# Patient Record
Sex: Female | Born: 1945 | Race: White | Hispanic: No | Smoking: Never smoker
Health system: Southern US, Community
[De-identification: ages and names within clinical notes are randomized; demographics above are authoritative.]

## PROBLEM LIST (undated history)

## (undated) DIAGNOSIS — F329 Major depressive disorder, single episode, unspecified: Secondary | ICD-10-CM

## (undated) DIAGNOSIS — T451X5A Adverse effect of antineoplastic and immunosuppressive drugs, initial encounter: Secondary | ICD-10-CM

## (undated) DIAGNOSIS — R112 Nausea with vomiting, unspecified: Secondary | ICD-10-CM

## (undated) DIAGNOSIS — R591 Generalized enlarged lymph nodes: Secondary | ICD-10-CM

## (undated) DIAGNOSIS — F32A Depression, unspecified: Secondary | ICD-10-CM

## (undated) DIAGNOSIS — I10 Essential (primary) hypertension: Secondary | ICD-10-CM

## (undated) DIAGNOSIS — G5600 Carpal tunnel syndrome, unspecified upper limb: Secondary | ICD-10-CM

## (undated) DIAGNOSIS — R630 Anorexia: Secondary | ICD-10-CM

## (undated) DIAGNOSIS — Z95828 Presence of other vascular implants and grafts: Secondary | ICD-10-CM

## (undated) DIAGNOSIS — C541 Malignant neoplasm of endometrium: Secondary | ICD-10-CM

## (undated) DIAGNOSIS — F419 Anxiety disorder, unspecified: Secondary | ICD-10-CM

## (undated) DIAGNOSIS — R634 Abnormal weight loss: Secondary | ICD-10-CM

## (undated) HISTORY — DX: Anxiety disorder, unspecified: F41.9

## (undated) HISTORY — DX: Malignant neoplasm of endometrium: C54.1

## (undated) HISTORY — DX: Nausea with vomiting, unspecified: R11.2

## (undated) HISTORY — DX: Adverse effect of antineoplastic and immunosuppressive drugs, initial encounter: T45.1X5A

## (undated) HISTORY — DX: Anorexia: R63.0

## (undated) HISTORY — PX: CARPAL TUNNEL RELEASE: SHX101

## (undated) HISTORY — DX: Generalized enlarged lymph nodes: R59.1

## (undated) HISTORY — DX: Major depressive disorder, single episode, unspecified: F32.9

## (undated) HISTORY — DX: Presence of other vascular implants and grafts: Z95.828

## (undated) HISTORY — PX: SKIN CANCER EXCISION: SHX779

## (undated) HISTORY — DX: Depression, unspecified: F32.A

## (undated) HISTORY — DX: Abnormal weight loss: R63.4

---

## 2015-05-09 HISTORY — PX: FLEXIBLE SIGMOIDOSCOPY: SHX1649

## 2015-10-25 ENCOUNTER — Encounter: Payer: Self-pay | Admitting: Emergency Medicine

## 2015-10-25 ENCOUNTER — Emergency Department: Payer: Medicare Other

## 2015-10-25 ENCOUNTER — Emergency Department
Admission: EM | Admit: 2015-10-25 | Discharge: 2015-10-25 | Disposition: A | Discharge status: Home / Self Care | Payer: Medicare Other | Attending: Emergency Medicine | Admitting: Emergency Medicine

## 2015-10-25 DIAGNOSIS — R634 Abnormal weight loss: Secondary | ICD-10-CM | POA: Diagnosis not present

## 2015-10-25 DIAGNOSIS — I10 Essential (primary) hypertension: Secondary | ICD-10-CM | POA: Diagnosis not present

## 2015-10-25 DIAGNOSIS — K644 Residual hemorrhoidal skin tags: Secondary | ICD-10-CM | POA: Diagnosis not present

## 2015-10-25 DIAGNOSIS — N3090 Cystitis, unspecified without hematuria: Secondary | ICD-10-CM | POA: Insufficient documentation

## 2015-10-25 DIAGNOSIS — R110 Nausea: Secondary | ICD-10-CM

## 2015-10-25 DIAGNOSIS — R591 Generalized enlarged lymph nodes: Secondary | ICD-10-CM

## 2015-10-25 DIAGNOSIS — D649 Anemia, unspecified: Secondary | ICD-10-CM | POA: Insufficient documentation

## 2015-10-25 DIAGNOSIS — R599 Enlarged lymph nodes, unspecified: Secondary | ICD-10-CM | POA: Insufficient documentation

## 2015-10-25 DIAGNOSIS — N390 Urinary tract infection, site not specified: Secondary | ICD-10-CM

## 2015-10-25 HISTORY — DX: Essential (primary) hypertension: I10

## 2015-10-25 HISTORY — DX: Carpal tunnel syndrome, unspecified upper limb: G56.00

## 2015-10-25 HISTORY — DX: Generalized enlarged lymph nodes: R59.1

## 2015-10-25 LAB — COMPREHENSIVE METABOLIC PANEL
ALBUMIN: 2.8 g/dL — AB (ref 3.5–5.0)
ALK PHOS: 71 U/L (ref 38–126)
ALT: 12 U/L — ABNORMAL LOW (ref 14–54)
ANION GAP: 12 (ref 5–15)
AST: 13 U/L — ABNORMAL LOW (ref 15–41)
BILIRUBIN TOTAL: 0.6 mg/dL (ref 0.3–1.2)
BUN: 14 mg/dL (ref 6–20)
CALCIUM: 9.2 mg/dL (ref 8.9–10.3)
CHLORIDE: 98 mmol/L — AB (ref 101–111)
CO2: 25 mmol/L (ref 22–32)
Creatinine, Ser: 0.78 mg/dL (ref 0.44–1.00)
GFR calc non Af Amer: >60 mL/min (ref >60)
GLUCOSE: 104 mg/dL — AB (ref 65–99)
Potassium: 3.5 mmol/L (ref 3.5–5.1)
Sodium: 135 mmol/L (ref 135–145)
Total Protein: 7.3 g/dL (ref 6.5–8.1)

## 2015-10-25 LAB — URINALYSIS COMPLETE WITH MICROSCOPIC (ARMC ONLY)
Bilirubin Urine: NEGATIVE
Glucose, UA: NEGATIVE mg/dL
Nitrite: NEGATIVE
PH: 5 (ref 5.0–8.0)
PROTEIN: 30 mg/dL — AB
Specific Gravity, Urine: 1.024 (ref 1.005–1.030)

## 2015-10-25 LAB — CBC WITH DIFFERENTIAL/PLATELET
BASOS PCT: 1 %
Basophils Absolute: 0 10*3/uL (ref 0–0.1)
Eosinophils Absolute: 0.1 10*3/uL (ref 0–0.7)
Eosinophils Relative: 2 %
HEMATOCRIT: 30.2 % — AB (ref 35.0–47.0)
HEMOGLOBIN: 9.9 g/dL — AB (ref 12.0–16.0)
LYMPHS ABS: 0.8 10*3/uL — AB (ref 1.0–3.6)
Lymphocytes Relative: 15 %
MCH: 25.4 pg — AB (ref 26.0–34.0)
MCHC: 32.9 g/dL (ref 32.0–36.0)
MCV: 77.2 fL — AB (ref 80.0–100.0)
MONO ABS: 0.6 10*3/uL (ref 0.2–0.9)
MONOS PCT: 12 %
NEUTROS ABS: 3.7 10*3/uL (ref 1.4–6.5)
NEUTROS PCT: 70 %
Platelets: 343 10*3/uL (ref 150–440)
RBC: 3.91 MIL/uL (ref 3.80–5.20)
RDW: 15.6 % — AB (ref 11.5–14.5)
WBC: 5.2 10*3/uL (ref 3.6–11.0)

## 2015-10-25 LAB — TROPONIN I

## 2015-10-25 MED ORDER — IOHEXOL 240 MG/ML SOLN
25.0000 mL | Freq: Once | INTRAMUSCULAR | Status: AC | PRN
  Administered 2015-10-25: 25 mL via ORAL

## 2015-10-25 MED ORDER — DEXTROSE 5 % IV SOLN
1.0000 g | Freq: Once | INTRAVENOUS | Status: AC
  Administered 2015-10-25: 1 g via INTRAVENOUS
  Filled 2015-10-25: qty 10

## 2015-10-25 MED ORDER — SODIUM CHLORIDE 0.9 % IV SOLN
Freq: Once | INTRAVENOUS | Status: AC
  Administered 2015-10-25: 14:00:00 via INTRAVENOUS

## 2015-10-25 MED ORDER — PROMETHAZINE HCL 25 MG/ML IJ SOLN
12.5000 mg | Freq: Once | INTRAMUSCULAR | Status: AC
  Administered 2015-10-25: 12.5 mg via INTRAVENOUS
  Filled 2015-10-25: qty 1

## 2015-10-25 MED ORDER — PROMETHAZINE HCL 25 MG PO TABS: 25.0000 mg | ORAL_TABLET | Freq: Four times a day (QID) | ORAL | Status: DC | PRN

## 2015-10-25 MED ORDER — IOHEXOL 300 MG/ML  SOLN
100.0000 mL | Freq: Once | INTRAMUSCULAR | Status: AC | PRN
  Administered 2015-10-25: 100 mL via INTRAVENOUS

## 2015-10-25 MED ORDER — SULFAMETHOXAZOLE-TRIMETHOPRIM 800-160 MG PO TABS: 1.0000 | ORAL_TABLET | Freq: Two times a day (BID) | ORAL | Status: DC

## 2015-10-25 NOTE — Discharge Instructions (Signed)
Nausea and Vomiting Nausea is a sick feeling that often comes before throwing up (vomiting). Vomiting is a reflex where stomach contents come out of your mouth. Vomiting can cause severe loss of body fluids (dehydration). Children and elderly adults can become dehydrated quickly, especially if they also have diarrhea. Nausea and vomiting are symptoms of a condition or disease. It is important to find the cause of your symptoms.  CAUSESDirect irritation of the stomach lining. This irritation can result from increased acid production (gastroesophageal reflux disease), infection, food poisoning, taking certain medicines (such as nonsteroidal anti-inflammatory drugs), alcohol use, or tobacco use.  Signals from the brain.These signals could be caused by a headache, heat exposure, an inner ear disturbance, increased pressure in the brain from injury, infection, a tumor, or a concussion, pain, emotional stimulus, or metabolic problems.  An obstruction in the gastrointestinal tract (bowel obstruction).  Illnesses such as diabetes, hepatitis, gallbladder problems, appendicitis, kidney problems, cancer, sepsis, atypical symptoms of a heart attack, or eating disorders.  Medical treatments such as chemotherapy and radiation.  Receiving medicine that makes you sleep (general anesthetic) during surgery. DIAGNOSIS Your caregiver may ask for tests to be done if the problems do not improve after a few days. Tests may also be done if symptoms are severe or if the reason for the nausea and vomiting is not clear. Tests may include:  Urine tests.  Blood tests.  Stool tests.  Cultures (to look for evidence of infection).  X-rays or other imaging studies. Test results can help your caregiver make decisions about treatment or the need for additional tests. TREATMENT You need to stay well hydrated. Drink frequently but in small amounts.You may wish to drink water, sports drinks, clear broth, or eat frozen ice  pops or gelatin dessert to help stay hydrated.When you eat, eating slowly may help prevent nausea.There are also some antinausea medicines that may help prevent nausea. HOME CARE INSTRUCTIONS   Take all medicine as directed by your caregiver.  If you do not have an appetite, do not force yourself to eat. However, you must continue to drink fluids.  If you have an appetite, eat a normal diet unless your caregiver tells you differently.  Eat a variety of complex carbohydrates (rice, wheat, potatoes, bread), lean meats, yogurt, fruits, and vegetables.  Avoid high-fat foods because they are more difficult to digest.  Drink enough water and fluids to keep your urine clear or pale yellow.  If you are dehydrated, ask your caregiver for specific rehydration instructions. Signs of dehydration may include:  Severe thirst.  Dry lips and mouth.  Dizziness.  Dark urine.  Decreasing urine frequency and amount.  Confusion.  Rapid breathing or pulse. SEEK IMMEDIATE MEDICAL CARE IF:   You have blood or brown flecks (like coffee grounds) in your vomit.  You have black or bloody stools.  You have a severe headache or stiff neck.  You are confused.  You have severe abdominal pain.  You have chest pain or trouble breathing.  You do not urinate at least once every 8 hours.  You develop cold or clammy skin.  You continue to vomit for longer than 24 to 48 hours.  You have a fever. MAKE SURE YOU:   Understand these instructions.  Will watch your condition.  Will get help right away if you are not doing well or get worse.   This information is not intended to replace advice given to you by your health care provider. Make sure you discuss  any questions you have with your health care provider.   Document Released: 08/24/2005 Document Revised: 11/16/2011 Document Reviewed: 01/21/2011 Elsevier Interactive Patient Education 2016 Elsevier Inc. Urinary Tract Infection Urinary tract  infections (UTIs) can develop anywhere along your urinary tract. Your urinary tract is your body's drainage system for removing wastes and extra water. Your urinary tract includes two kidneys, two ureters, a bladder, and a urethra. Your kidneys are a pair of bean-shaped organs. Each kidney is about the size of your fist. They are located below your ribs, one on each side of your spine. CAUSES Infections are caused by microbes, which are microscopic organisms, including fungi, viruses, and bacteria. These organisms are so small that they can only be seen through a microscope. Bacteria are the microbes that most commonly cause UTIs. SYMPTOMS  Symptoms of UTIs may vary by age and gender of the patient and by the location of the infection. Symptoms in young women typically include a frequent and intense urge to urinate and a painful, burning feeling in the bladder or urethra during urination. Older women and men are more likely to be tired, shaky, and weak and have muscle aches and abdominal pain. A fever may mean the infection is in your kidneys. Other symptoms of a kidney infection include pain in your back or sides below the ribs, nausea, and vomiting. DIAGNOSIS To diagnose a UTI, your caregiver will ask you about your symptoms. Your caregiver will also ask you to provide a urine sample. The urine sample will be tested for bacteria and [place] blood cells. [place] blood cells are made by your body to help fight infection. TREATMENT  Typically, UTIs can be treated with medication. Because most UTIs are caused by a bacterial infection, they usually can be treated with the use of antibiotics. The choice of antibiotic and length of treatment depend on your symptoms and the type of bacteria causing your infection. HOME CARE INSTRUCTIONS  If you were prescribed antibiotics, take them exactly as your caregiver instructs you. Finish the medication even if you feel better after you have only taken some of the  medication.  Drink enough water and fluids to keep your urine clear or pale yellow.  Avoid caffeine, tea, and carbonated beverages. They tend to irritate your bladder.  Empty your bladder often. Avoid holding urine for long periods of time.  Empty your bladder before and after sexual intercourse.  After a bowel movement, women should cleanse from front to back. Use each tissue only once. SEEK MEDICAL CARE IF:   You have back pain.  You develop a fever.  Your symptoms do not begin to resolve within 3 days. SEEK IMMEDIATE MEDICAL CARE IF:   You have severe back pain or lower abdominal pain.  You develop chills.  You have nausea or vomiting.  You have continued burning or discomfort with urination. MAKE SURE YOU:   Understand these instructions.  Will watch your condition.  Will get help right away if you are not doing well or get worse.   This information is not intended to replace advice given to you by your health care provider. Make sure you discuss any questions you have with your health care provider.   Document Released: 06/03/2005 Document Revised: 05/15/2015 Document Reviewed: 10/02/2011 Elsevier Interactive Patient Education 2016 Elsevier Inc.   Lymphadenopathy Lymphadenopathy refers to swollen or enlarged lymph glands, also called lymph nodes. Lymph glands are part of your body's defense (immune) system, which protects the body from infections, germs, and  diseases. Lymph glands are found in many locations in your body, including the neck, underarm, and groin.  Many things can cause lymph glands to become enlarged. When your immune system responds to germs, such as viruses or bacteria, infection-fighting cells and fluid build up. This causes the glands to grow in size. Usually, this is not something to worry about. The swelling and any soreness often go away without treatment. However, swollen lymph glands can also be caused by a number of diseases. Your health care  provider may do various tests to help determine the cause. If the cause of your swollen lymph glands cannot be found, it is important to monitor your condition to make sure the swelling goes away. HOME CARE INSTRUCTIONS Watch your condition for any changes. The following actions may help to lessen any discomfort you are feeling: Get plenty of rest. Take medicines only as directed by your health care provider. Your health care provider may recommend over-the-counter medicines for pain. Apply moist heat compresses to the site of swollen lymph nodes as directed by your health care provider. This can help reduce any pain. Check your lymph nodes daily for any changes. Keep all follow-up visits as directed by your health care provider. This is important. SEEK MEDICAL CARE IF: Your lymph nodes are still swollen after 2 weeks. Your swelling increases or spreads to other areas. Your lymph nodes are hard, seem fixed to the skin, or are growing rapidly. Your skin over the lymph nodes is red and inflamed. You have a fever. You have chills. You have fatigue. You develop a sore throat. You have abdominal pain. You have weight loss. You have night sweats. SEEK IMMEDIATE MEDICAL CARE IF: You notice fluid leaking from the area of the enlarged lymph node. You have severe pain in any area of your body. You have chest pain. You have shortness of breath.   This information is not intended to replace advice given to you by your health care provider. Make sure you discuss any questions you have with your health care provider.   Document Released: 06/02/2008 Document Revised: 09/14/2014 Document Reviewed: 03/29/2014 Elsevier Interactive Patient Education [employer].

## 2015-10-25 NOTE — ED Notes (Signed)
Patient transported to CT 

## 2015-10-25 NOTE — ED Notes (Signed)
Nausea all wk, some times vomits after eating. Denies pain.

## 2015-10-25 NOTE — ED Provider Notes (Signed)
[facility]  I accepted care from [facility] ____________________________________________     [place] were viewed by me. Imaging interpreted by radiologist.  CT abdomen and pelvis with contrast:  IMPRESSION: Retroperitoneal lymphadenopathy in the abdomen and pelvis. There is also bilateral inguinal lymphadenopathy. Findings are concerning for a neoplastic process. Uterus is very nodular with probable fibroid disease but difficult to differentiate ovarian tissue from the uterus and cannot exclude a neoplastic process involving the uterus or adnexa. The left inguinal lymph node is the most accessible target for tissue sampling.  1.6 cm nodular structure along the medial aspect of the right colon. This is nonspecific and could represent a diverticulum. Recommend attention to this area on follow up imaging versus screening colonoscopy.  Small nodular densities along the left lung base. These are nonspecific and the differential diagnosis may change depending on the etiology for the lymphadenopathy.  Gallstones.  Hepatic and renal cysts.  These results were called by telephone at the time of interpretation on 10/25/2015 at 3:44 pm to Dr. Reita Cliche, who verbally acknowledged these results.  ____________________________________________   PROCEDURES  Procedure(s) performed: None  Critical Care performed: None  ____________________________________________   INITIAL IMPRESSION / ASSESSMENT AND PLAN / ED COURSE   Pertinent labs & imaging results that were available during my care of the patient were reviewed by me and considered in my medical decision making (see chart for details).  I did call from radiologist concerning for metastatic cancer, possible ovarian/uterine source with lymphadenopathy throughout the abdomen and pelvis. I did discuss the CT results with patient and her spouse. I spoke with on-call medical oncologist, Dr.Brahamandy -  given that the patient is clinically stable, we discussed no in-hospital admission, but follow-up early next week with oncology with plan for lymph node biopsy. Patient's information was given to the oncologist will call the patient. Patient was given office number to follow-up on Monday to make sure the appointment is scheduled for early next week.  CONSULTATIONS: Phone consultation with medical oncology.    Patient / Family / Caregiver informed of clinical course, medical decision-making process, and agree with plan.   I discussed return precautions, follow-up instructions, and discharged instructions with patient and/or family.     ____________________________________________   FINAL CLINICAL IMPRESSION(S) / ED DIAGNOSES  Final diagnoses:  Nausea  Weight loss  Anemia, unspecified anemia type  UTI (lower urinary tract infection)  Lymphadenopathy        Lisa Roca, MD 10/25/15 [phone]

## 2015-10-25 NOTE — ED Provider Notes (Signed)
[facility] Emergency Department Provider Note     Time seen: ----------------------------------------- 1:17 PM on 10/25/2015 -----------------------------------------    I have reviewed the triage vital signs and the nursing notes.   HISTORY  Chief Complaint Nausea    HPI Jennifer Berry is a 70 y.o. female who presents to the ER for nausea and vomiting since October. Patient has been seen by her primary care doctor who has put her on antiemetics that haven't helped. Patient has lost a considerable amount of weight, every time she she gets nauseous and has no appetite. She denies being stressed or depressed, but she denies any other recent illness or symptoms. She denies any abdominal pain with eating. Nothing has made her symptoms better.  Past Medical History  Diagnosis Date  . Hypertension   . Carpal tunnel syndrome     There are no active problems to display for this patient.   History reviewed. No pertinent past surgical history.  Allergies Ibuprofen  Social History Social History  Substance Use Topics  . Smoking status: Never Smoker   . Smokeless tobacco: None  . Alcohol Use: None    Review of Systems Constitutional: Negative for fever. Positive for weight loss Eyes: Negative for visual changes. ENT: Negative for sore throat. Cardiovascular: Negative for chest pain. Respiratory: Negative for shortness of breath. Gastrointestinal: Negative for abdominal pain, positive for persistent vomiting Genitourinary: Negative for dysuria. Musculoskeletal: Negative for back pain. Skin: Negative for rash. Neurological: Negative for headaches, focal weakness or numbness.  10-point ROS otherwise negative.  ____________________________________________   PHYSICAL EXAM:  VITAL SIGNS: ED Triage Vitals  Enc Vitals Group     BP 10/25/15 1007 150/69 mmHg     Pulse Rate 10/25/15 1007 72     Resp 10/25/15 1007 18     Temp 10/25/15 1007 98.6 F  (37 C)     Temp Source 10/25/15 1007 Oral     SpO2 10/25/15 1007 100 %     Weight 10/25/15 1007 150 lb (68.04 kg)     Height 10/25/15 1007 5\' 6"  (1.676 m)     Head Cir --      Peak Flow --      Pain Score --      Pain Loc --      Pain Edu? --      Excl. in [place]? --     Constitutional: Alert and oriented. Well appearing and in no distress. Eyes: Conjunctivae are normal. PERRL. Normal extraocular movements. ENT   Head: Normocephalic and atraumatic.   Nose: No congestion/rhinnorhea.   Mouth/Throat: Mucous membranes are moist.   Neck: No stridor. Cardiovascular: Normal rate, regular rhythm. Normal and symmetric distal pulses are present in all extremities. No murmurs, rubs, or gallops. Respiratory: Normal respiratory effort without tachypnea nor retractions. Breath sounds are clear and equal bilaterally. No wheezes/rales/rhonchi. Gastrointestinal: Soft and nontender. No distention. No abdominal bruits.  Rectal: Small external hemorrhoid, nonbleeding, heme-negative stool Musculoskeletal: Nontender with normal range of motion in all extremities. No joint effusions.  No lower extremity tenderness nor edema. Neurologic:  Normal speech and language. No gross focal neurologic deficits are appreciated.  Skin:  Skin is warm, dry and intact. No rash noted. Psychiatric: Mood and affect are normal. Speech and behavior are normal. Patient exhibits appropriate insight and judgment. ____________________________________________  ED COURSE:  Pertinent labs & imaging results that were available during my care of the patient were reviewed by me and considered in my medical decision making (  see chart for details). Patient with persistent nausea and vomiting, weight loss. I will evaluate her for possible cancer. ____________________________________________    LABS (pertinent positives/negatives)  Labs Reviewed  COMPREHENSIVE METABOLIC PANEL - Abnormal; Notable for the following:     Chloride 98 (*)    Glucose, Bld 104 (*)    Albumin 2.8 (*)    AST 13 (*)    ALT 12 (*)    All other components within normal limits  CBC WITH DIFFERENTIAL/PLATELET - Abnormal; Notable for the following:    Hemoglobin 9.9 (*)    HCT 30.2 (*)    MCV 77.2 (*)    MCH 25.4 (*)    RDW 15.6 (*)    Lymphs Abs 0.8 (*)    All other components within normal limits  URINALYSIS COMPLETEWITH MICROSCOPIC ([facility] ONLY) - Abnormal; Notable for the following:    Color, Urine AMBER (*)    APPearance HAZY (*)    Ketones, ur 1+ (*)    Hgb urine dipstick 1+ (*)    Protein, ur 30 (*)    Leukocytes, UA 2+ (*)    Bacteria, UA RARE (*)    Squamous Epithelial / LPF 6-30 (*)    All other components within normal limits  TROPONIN I    RADIOLOGY Images were viewed by me Chest x-ray is normal CT of the abdomen and pelvis with contrast is pending ____________________________________________  FINAL ASSESSMENT AND PLAN  Vomiting, anemia, cystitis  Plan: Patient with labs and imaging as dictated above. Patient was given a liter of fluids as well as IV Rocephin for urinary tract infection.  Final disposition has been checked out to Dr. Reita Cliche. Earleen Newport, MD   Earleen Newport, MD 10/25/15 [phone]

## 2015-10-25 NOTE — ED Notes (Signed)
Pt c/o nausea and vomiting every time she tries to eat. Pt sts "I just can't keep nothing down".  Pt sts she saw PCP 3 wks ago for same problem and was given zofran, but unable to keep pills down.  Pt also sts she has not been able to take regular home meds.

## 2015-10-26 ENCOUNTER — Other Ambulatory Visit: Payer: Self-pay | Admitting: Internal Medicine

## 2015-10-28 ENCOUNTER — Telehealth: Payer: Self-pay | Admitting: *Deleted

## 2015-10-28 ENCOUNTER — Inpatient Hospital Stay: Payer: Medicare Other

## 2015-10-28 ENCOUNTER — Ambulatory Visit: Payer: Medicare Other | Admitting: Internal Medicine

## 2015-10-28 ENCOUNTER — Encounter: Payer: Self-pay | Admitting: Internal Medicine

## 2015-10-28 ENCOUNTER — Inpatient Hospital Stay: Payer: Medicare Other | Attending: Internal Medicine | Admitting: Internal Medicine

## 2015-10-28 VITALS — BP 164/85 | HR 83 | Temp 98.0°F | Resp 18 | Ht 66.0 in | Wt 158.3 lb

## 2015-10-28 DIAGNOSIS — R590 Localized enlarged lymph nodes: Secondary | ICD-10-CM | POA: Diagnosis not present

## 2015-10-28 DIAGNOSIS — I2510 Atherosclerotic heart disease of native coronary artery without angina pectoris: Secondary | ICD-10-CM | POA: Diagnosis not present

## 2015-10-28 DIAGNOSIS — Z7982 Long term (current) use of aspirin: Secondary | ICD-10-CM | POA: Insufficient documentation

## 2015-10-28 DIAGNOSIS — D509 Iron deficiency anemia, unspecified: Secondary | ICD-10-CM | POA: Insufficient documentation

## 2015-10-28 DIAGNOSIS — R1909 Other intra-abdominal and pelvic swelling, mass and lump: Secondary | ICD-10-CM

## 2015-10-28 DIAGNOSIS — R630 Anorexia: Secondary | ICD-10-CM | POA: Insufficient documentation

## 2015-10-28 DIAGNOSIS — G5600 Carpal tunnel syndrome, unspecified upper limb: Secondary | ICD-10-CM

## 2015-10-28 DIAGNOSIS — Z79899 Other long term (current) drug therapy: Secondary | ICD-10-CM | POA: Diagnosis not present

## 2015-10-28 DIAGNOSIS — R634 Abnormal weight loss: Secondary | ICD-10-CM | POA: Diagnosis not present

## 2015-10-28 DIAGNOSIS — R61 Generalized hyperhidrosis: Secondary | ICD-10-CM | POA: Diagnosis not present

## 2015-10-28 DIAGNOSIS — K8020 Calculus of gallbladder without cholecystitis without obstruction: Secondary | ICD-10-CM | POA: Diagnosis not present

## 2015-10-28 DIAGNOSIS — R112 Nausea with vomiting, unspecified: Secondary | ICD-10-CM

## 2015-10-28 DIAGNOSIS — I10 Essential (primary) hypertension: Secondary | ICD-10-CM | POA: Diagnosis not present

## 2015-10-28 DIAGNOSIS — C778 Secondary and unspecified malignant neoplasm of lymph nodes of multiple regions: Secondary | ICD-10-CM | POA: Insufficient documentation

## 2015-10-28 DIAGNOSIS — D6489 Other specified anemias: Secondary | ICD-10-CM

## 2015-10-28 DIAGNOSIS — C801 Malignant (primary) neoplasm, unspecified: Secondary | ICD-10-CM | POA: Diagnosis present

## 2015-10-28 DIAGNOSIS — R1900 Intra-abdominal and pelvic swelling, mass and lump, unspecified site: Secondary | ICD-10-CM

## 2015-10-28 DIAGNOSIS — C189 Malignant neoplasm of colon, unspecified: Secondary | ICD-10-CM

## 2015-10-28 DIAGNOSIS — F418 Other specified anxiety disorders: Secondary | ICD-10-CM | POA: Diagnosis not present

## 2015-10-28 DIAGNOSIS — K769 Liver disease, unspecified: Secondary | ICD-10-CM

## 2015-10-28 DIAGNOSIS — N281 Cyst of kidney, acquired: Secondary | ICD-10-CM | POA: Diagnosis not present

## 2015-10-28 LAB — COMPREHENSIVE METABOLIC PANEL
ALBUMIN: 3 g/dL — AB (ref 3.5–5.0)
ALT: 9 U/L — ABNORMAL LOW (ref 14–54)
ANION GAP: 5 (ref 5–15)
AST: 12 U/L — ABNORMAL LOW (ref 15–41)
Alkaline Phosphatase: 63 U/L (ref 38–126)
BILIRUBIN TOTAL: 0.5 mg/dL (ref 0.3–1.2)
BUN: 8 mg/dL (ref 6–20)
CO2: 23 mmol/L (ref 22–32)
Calcium: 8.9 mg/dL (ref 8.9–10.3)
Chloride: 103 mmol/L (ref 101–111)
Creatinine, Ser: 0.88 mg/dL (ref 0.44–1.00)
GFR calc Af Amer: >60 mL/min (ref >60)
GFR calc non Af Amer: >60 mL/min (ref >60)
GLUCOSE: 118 mg/dL — AB (ref 65–99)
POTASSIUM: 3.4 mmol/L — AB (ref 3.5–5.1)
SODIUM: 131 mmol/L — AB (ref 135–145)
TOTAL PROTEIN: 7.7 g/dL (ref 6.5–8.1)

## 2015-10-28 LAB — IRON AND TIBC
Iron: 11 ug/dL — ABNORMAL LOW (ref 28–170)
SATURATION RATIOS: 6 % — AB (ref 10.4–31.8)
TIBC: 179 ug/dL — ABNORMAL LOW (ref 250–450)
UIBC: 168 ug/dL

## 2015-10-28 LAB — CBC WITH DIFFERENTIAL/PLATELET
BASOS PCT: 0 %
Basophils Absolute: 0 10*3/uL (ref 0–0.1)
EOS ABS: 0.1 10*3/uL (ref 0–0.7)
EOS PCT: 2 %
HEMATOCRIT: 29.9 % — AB (ref 35.0–47.0)
Hemoglobin: 9.9 g/dL — ABNORMAL LOW (ref 12.0–16.0)
Lymphocytes Relative: 15 %
Lymphs Abs: 0.7 10*3/uL — ABNORMAL LOW (ref 1.0–3.6)
MCH: 25.9 pg — ABNORMAL LOW (ref 26.0–34.0)
MCHC: 33.1 g/dL (ref 32.0–36.0)
MCV: 78.2 fL — ABNORMAL LOW (ref 80.0–100.0)
MONO ABS: 0.5 10*3/uL (ref 0.2–0.9)
MONOS PCT: 11 %
Neutro Abs: 3.4 10*3/uL (ref 1.4–6.5)
Neutrophils Relative %: 72 %
Platelets: 385 10*3/uL (ref 150–440)
RBC: 3.82 MIL/uL (ref 3.80–5.20)
RDW: 15.5 % — AB (ref 11.5–14.5)
WBC: 4.7 10*3/uL (ref 3.6–11.0)

## 2015-10-28 LAB — PROTIME-INR
INR: 1.32
Prothrombin Time: 16.5 seconds — ABNORMAL HIGH (ref 11.4–15.0)

## 2015-10-28 LAB — APTT: APTT: 37 s — AB (ref 24–36)

## 2015-10-28 LAB — FERRITIN: FERRITIN: 248 ng/mL (ref 11–307)

## 2015-10-28 LAB — LACTATE DEHYDROGENASE: LDH: 117 U/L (ref 98–192)

## 2015-10-28 LAB — RETICULOCYTES
RBC.: 3.82 MIL/uL (ref 3.80–5.20)
RETIC COUNT ABSOLUTE: 49.7 10*3/uL (ref 19.0–183.0)
Retic Ct Pct: 1.3 % (ref 0.4–3.1)

## 2015-10-28 NOTE — Progress Notes (Signed)
Last mammogram September 2016 per patient.  She also had a flex sigmoid at that time by her primary care. She reports persistent episodes of nausea and vomiting that started since October 2016. She has had a 34 lb weight loss since October. She reports frequent episodes of "drenching night sweats."  She is accompanied by her daughter, Lattie Haw at today's visit. The patient's daughter lives in [place], [place].

## 2015-10-28 NOTE — Telephone Encounter (Signed)
pt called x 3 and hung up on clinical hotline. Msg left x 3. VM left was not audible. Based on ph# attached to call, RN Able to verify that the patient was trying to call back. I attempted to contact pt back. Unable to leave VM as phone msg system.   Spoke with patient's daughter. She was aware of the appointment and the need for being NPO after midnight tonight. Teach back process performed with patient's daughter. Daughter unable to come for the apt tomorrow due to work; however, the pt's husband will bring pt to appointment.

## 2015-10-28 NOTE — Telephone Encounter (Signed)
Speciality scheduling contacted cancer center. Pt's left inguinal lymph node ultrasound guided bx can be performed tomorrow. Pt needs to arrive at 830 am and she will need to remain NPO after midnight tonight. I have attempted x 3 to call the patient over the last hour. I also attempted to contact her emergency contact-daughter Lattie Haw. No answer.

## 2015-10-28 NOTE — Progress Notes (Signed)
  Oncology Nurse Navigator Documentation  Navigator Location: [facility]-Med Onc (10/28/15 1100) Navigator Encounter Type: Initial MedOnc (10/28/15 1100)   Abnormal Finding Date: 10/25/15 (10/28/15 1100)       Patient Visit Type: Initial;MedOnc (10/28/15 1100) Treatment Phase: Abnormal Scans (10/28/15 1100) Barriers/Navigation Needs: Education (10/28/15 1100) Education:  (Chest Ct/biopsy) (10/28/15 1100) Interventions: Education Method (10/28/15 1100)     Education Method: Verbal (10/28/15 1100)      Acuity: Level 2 (10/28/15 1100)         Time Spent with Patient: 26 (10/28/15 1100)   Attended initial Med Onc consult with Dr Rogue Bussing. Introduced Therapist, nutritional and provided contact information for any further questions or concerns. CT chest scheduled 2/21, labs today, left inguinal biopsy being arranged.

## 2015-10-28 NOTE — Progress Notes (Signed)
[facility] CONSULT NOTE  Patient Care Team: Petra Kuba, MD as PCP - General (Family Medicine)  CHIEF COMPLAINTS/PURPOSE OF CONSULTATION: Lymphadenopathy concerning for malignancy  HISTORY OF PRESENTING ILLNESS:  Jennifer Berry 70 y.o.  female no significant past medical history except for hypertension was recently evaluated in the emergency room given the nausea and overall feeling poorly. CT of the abdomen and pelvis showed- multiple retroperitoneal lymph nodes; and pelvic adenopathy including inguinal adenopathy. Also nodular contour of the uterus was noted/with possible adnexal mass. She's been referred to [place] for further evaluation/and recommendations.  Patient's notes to have intermittent nausea/feeling poorly at least since fall of last year. She also complains of a 30 pound weight loss; and also complains of night sweats 3-4 episodes. Poor appetite. Denies any blood in stools or denies any abnormal vaginal bleeding. Patient had a flexible sigmoidoscopy in September 2016.  No headaches; no unusual shortness of breath or chest pain. Intermittent cough. No vision changes-or any falls. Denies any tingling and numbness. She denies any pain anywhere.  ROS: A complete 10 point review of system is done which is negative except mentioned above in history of present illness  MEDICAL HISTORY:  Past Medical History  Diagnosis Date  . Hypertension   . Carpal tunnel syndrome   . Lymphadenopathy 10/25/15  . Decreased appetite   . Weight loss     30 lb weight loss since October 2016  . Anxiety   . Depression     SURGICAL HISTORY: Past Surgical History  Procedure Laterality Date  . Carpal tunnel release Left   . Skin cancer excision    . Flexible sigmoidoscopy  05/2015    performed by Dr. Petra Kuba,    SOCIAL HISTORY: No smoking and alcohol. She lives with family at home.  Social History   Social History  . Marital Status: Divorced    Spouse Name: N/A  .  Number of Children: N/A  . Years of Education: N/A   Occupational History  . Not on file.   Social History Main Topics  . Smoking status: Never Smoker   . Smokeless tobacco: Never Used  . Alcohol Use: No  . Drug Use: No  . Sexual Activity: No   Other Topics Concern  . Not on file   Social History Narrative    FAMILY HISTORY: No family history of cancer.   ALLERGIES:  is allergic to ibuprofen and tape.  MEDICATIONS:  Current Outpatient Prescriptions  Medication Sig Dispense Refill  . amLODipine (NORVASC) 10 MG tablet Take 1 tablet by mouth daily.    Marland Kitchen aspirin EC 81 MG tablet Take 81 mg by mouth daily.    . hydrochlorothiazide (HYDRODIURIL) 25 MG tablet Take 1 tablet by mouth daily.    Marland Kitchen losartan (COZAAR) 100 MG tablet Take 1 tablet by mouth daily.    . promethazine (PHENERGAN) 25 MG tablet Take 1 tablet (25 mg total) by mouth every 6 (six) hours as needed for nausea or vomiting. 20 tablet 0  . sulfamethoxazole-trimethoprim (BACTRIM DS) 800-160 MG tablet Take 1 tablet by mouth 2 (two) times daily. 20 tablet 0  . ondansetron (ZOFRAN) 4 MG tablet Take by mouth. Reported on 10/28/2015     No current facility-administered medications for this visit.      Marland Kitchen  PHYSICAL EXAMINATION: ECOG PERFORMANCE STATUS: 1 - Symptomatic but completely ambulatory  Filed Vitals:   10/28/15 0821  BP: 164/85  Pulse: 83  Temp: 98 F (36.7  C)  Resp: 18   Filed Weights   10/28/15 0809  Weight: 158 lb 4.6 oz (71.8 kg)    GENERAL: Well-nourished well-developed; Alert, no distress and  Mildly comfortable given her nausea. She is accompanied by her daughter. EYES: no pallor or icterus OROPHARYNX: no thrush or ulceration; good dentition  NECK: supple, no masses felt LYMPH:  no palpable lymphadenopathy in the cervical ; 1 cm left axillary lymph node felt; about 1 cm left inguinal lymph node felt. LUNGS: clear to auscultation and  No wheeze or crackles HEART/CVS: regular rate & rhythm and no  murmurs; No lower extremity edema ABDOMEN: abdomen soft, non-tender and normal bowel sounds Musculoskeletal:no cyanosis of digits and no clubbing  PSYCH: alert & oriented x 3 with fluent speech NEURO: no focal motor/sensory deficits SKIN:  no rashes or significant lesions  LABORATORY DATA:  I have reviewed the data as listed Lab Results  Component Value Date   WBC 5.2 10/25/2015   HGB 9.9* 10/25/2015   HCT 30.2* 10/25/2015   MCV 77.2* 10/25/2015   PLT 343 10/25/2015    Recent Labs  10/25/15 1027  NA 135  K 3.5  CL 98*  CO2 25  GLUCOSE 104*  BUN 14  CREATININE 0.78  CALCIUM 9.2  GFRNONAA >60  GFRAA >60  PROT 7.3  ALBUMIN 2.8*  AST 13*  ALT 12*  ALKPHOS 71  BILITOT 0.6    RADIOGRAPHIC STUDIES: I have personally reviewed the radiological images as listed and agreed with the findings in the report. Dg Chest 1 View  10/25/2015  CLINICAL DATA:  Patient states that she has had worsening nausea and vomiting since October which has worsened this past week and especially today. Patient states that she visited her PCP for symptoms and was prescribed nausea medication which has not helped. Patient states that almost everything she eats or drinks comes back up within a very short time. Patient has a HX of HTN. EXAM: CHEST 1 VIEW COMPARISON:  None. FINDINGS: The heart size and mediastinal contours are within normal limits. Both lungs are clear. No pleural effusion or pneumothorax. Bony thorax intact. IMPRESSION: No active disease. Electronically Signed   By: Lajean Manes M.D.   On: 10/25/2015 14:45   Ct Abdomen Pelvis W Contrast  10/25/2015  CLINICAL DATA:  Weight loss and persistent vomiting. EXAM: CT ABDOMEN AND PELVIS WITH CONTRAST TECHNIQUE: Multidetector CT imaging of the abdomen and pelvis was performed using the standard protocol following bolus administration of intravenous contrast. CONTRAST:  182mL OMNIPAQUE IOHEXOL 300 MG/ML  SOLN COMPARISON:  None. FINDINGS: Lower chest: 6  mm pleural-based nodule at the left lung base on sequence 4, images 6. There is adjacent pleural thickening on image 5. Otherwise, the lung bases are clear without pleural effusions. Hepatobiliary: 1.6 cm low-density exophytic structure involving the inferior left hepatic lobe. This is low-density could represent a cyst. There is also punctate low-density structure along the right hepatic lobe on sequence 2, image 7 which is too small to characterize. No other liver lesions. There are small calcified gallstones without gallbladder enlargement or inflammation. No significant biliary dilatation. Pancreas: Normal appearance of the pancreas without duct dilatation or inflammation. Spleen: No acute abnormality to the spleen. Adrenals/Urinary Tract: Normal appearance of the adrenal glands. Large central right renal cyst measuring up to 5.8 cm without hydronephrosis. Probable small cyst in the left kidney upper pole. No suspicious renal lesions. Normal appearance of the urinary bladder. Stomach/Bowel: There is a 1.6 cm round  dense structure along the medial aspect of the right colon on sequence 2, image 36. Difficult to exclude a nodular structure or diverticulum involving the colon at this level. No gross abnormality to the stomach, duodenum or small bowel. No evidence for a bowel obstruction. Vascular/Lymphatic: Enlarged bilateral inguinal lymph nodes. Largest node is on the left side measuring 2.4 x 1.7 cm on sequence 2, image 72. There are enlarged bilateral pelvic lymph nodes in the external iliac nodal chains. Lesion on the right side measures 2.1 x 1.5 cm on sequence 2, image 60. Enlarged left retroperitoneal lymph node along the left side of the aortic bifurcation measures 2.0 x 1.4 cm. There are enlarged lymph nodes in the aortocaval stations. Enlarged left periaortic lymph node just below the left renal vein measures 2.0 cm in the short axis. Atherosclerotic calcifications in the aorta and iliac arteries without  aneurysm. No evidence for thrombus in the IVC, renal veins and iliac veins. Reproductive: Uterus is very nodular within a unusual configuration. There are heterogeneous, low-density and calcified structures within uterus which probably represent fibroids. The adnexal tissue appears to be adjacent to the uterus but difficult to differentiate from the uterus. Cannot exclude enlargement of the left ovary or left adnexa measuring up to 5.0 cm on sequence 2, image 59. Other: No significant free fluid. There is perirectal edema. No free air. Musculoskeletal: Multilevel degenerative disc disease with vacuum discs. IMPRESSION: Retroperitoneal lymphadenopathy in the abdomen and pelvis. There is also bilateral inguinal lymphadenopathy. Findings are concerning for a neoplastic process. Uterus is very nodular with probable fibroid disease but difficult to differentiate ovarian tissue from the uterus and cannot exclude a neoplastic process involving the uterus or adnexa. The left inguinal lymph node is the most accessible target for tissue sampling. 1.6 cm nodular structure along the medial aspect of the right colon. This is nonspecific and could represent a diverticulum. Recommend attention to this area on follow up imaging versus screening colonoscopy. Small nodular densities along the left lung base. These are nonspecific and the differential diagnosis may change depending on the etiology for the lymphadenopathy. Gallstones. Hepatic and renal cysts. These results were called by telephone at the time of interpretation on 10/25/2015 at 3:44 pm to Dr. Reita Cliche, who verbally acknowledged these results. Electronically Signed   By: Markus Daft M.D.   On: 10/25/2015 15:49    ASSESSMENT & PLAN:   # Lymphadenopathy- bulky retroperitoneal/pelvic/inguinal adenopathy- unclear etiology; but highly concerning for malignancy [given the weight loss; night sweats; lymphadenopathy based on imaging]. The differential diagnosis at this time is  broad- uterine/ovarian; colonic versus lymphoma. Left inguinal lymph node as most accessible for biopsy. We'll proceed with an ultrasound-guided biopsy of the left inguinal lymph node. Check CBC CMP LDH; check CEA 125 CEA. Check LDH.   # Microcytic anemia- question etiology. Check stool occult cards; also iron studies.  # Persistent nausea- unclear etiology. Recommend symptomatic management with Zofran/Phenergan at this time.  Patient will follow-up with me in approximately 1 week to review the above workup.  # 45 minutes face-to-face with the patient discussing the above plan of care; more than 50% of time spent on counseling and coordination.     Cammie Sickle, MD 10/28/2015 9:22 AM

## 2015-10-29 ENCOUNTER — Ambulatory Visit
Admission: RE | Admit: 2015-10-29 | Discharge: 2015-10-29 | Disposition: A | Discharge status: Home / Self Care | Payer: Medicare Other | Source: Ambulatory Visit | Attending: Internal Medicine | Admitting: Internal Medicine

## 2015-10-29 DIAGNOSIS — C189 Malignant neoplasm of colon, unspecified: Secondary | ICD-10-CM | POA: Insufficient documentation

## 2015-10-29 DIAGNOSIS — J439 Emphysema, unspecified: Secondary | ICD-10-CM | POA: Insufficient documentation

## 2015-10-29 DIAGNOSIS — Z01811 Encounter for preprocedural respiratory examination: Secondary | ICD-10-CM | POA: Diagnosis present

## 2015-10-29 DIAGNOSIS — D6489 Other specified anemias: Secondary | ICD-10-CM | POA: Insufficient documentation

## 2015-10-29 DIAGNOSIS — R918 Other nonspecific abnormal finding of lung field: Secondary | ICD-10-CM | POA: Diagnosis not present

## 2015-10-29 DIAGNOSIS — R590 Localized enlarged lymph nodes: Secondary | ICD-10-CM | POA: Diagnosis present

## 2015-10-29 DIAGNOSIS — R1900 Intra-abdominal and pelvic swelling, mass and lump, unspecified site: Secondary | ICD-10-CM

## 2015-10-29 DIAGNOSIS — C8593 Non-Hodgkin lymphoma, unspecified, intra-abdominal lymph nodes: Secondary | ICD-10-CM | POA: Diagnosis not present

## 2015-10-29 LAB — SOLUBLE TRANSFERRIN RECEPTOR: Transferrin Receptor: 28.6 nmol/L — ABNORMAL HIGH (ref 12.2–27.3)

## 2015-10-29 LAB — CEA: CEA: 1.2 ng/mL (ref 0.0–4.7)

## 2015-10-29 LAB — CA 125: CA 125: 37.9 U/mL (ref 0.0–38.1)

## 2015-10-29 MED ORDER — IOHEXOL 300 MG/ML  SOLN
75.0000 mL | Freq: Once | INTRAMUSCULAR | Status: AC | PRN
  Administered 2015-10-29: 75 mL via INTRAVENOUS

## 2015-10-29 MED ORDER — MIDAZOLAM HCL 2 MG/2ML IJ SOLN
INTRAMUSCULAR | Status: AC | PRN
  Administered 2015-10-29: 0.5 mg via INTRAVENOUS
  Administered 2015-10-29: 1 mg via INTRAVENOUS

## 2015-10-29 MED ORDER — SODIUM CHLORIDE 0.9 % IV SOLN
INTRAVENOUS | Status: DC
  Administered 2015-10-29: 09:00:00 via INTRAVENOUS

## 2015-10-29 MED ORDER — FENTANYL CITRATE (PF) 100 MCG/2ML IJ SOLN
INTRAMUSCULAR | Status: AC | PRN
  Administered 2015-10-29: 25 ug via INTRAVENOUS
  Administered 2015-10-29: 50 ug via INTRAVENOUS

## 2015-10-29 NOTE — Consult Note (Signed)
Chief Complaint: Unintentional weight loss with associated retroperitoneal and inguinal lymphadenopathy of uncertain etiology.  Referring Physician(s): Brahmanday,Govinda R  History of Present Illness: Jennifer Berry is a 70 y.o. female without any significant past medical history who underwent a CT of the abdomen and pelvis on 10/25/2015 for the workup of nausea and weight loss where it was discovered the patient has retroperitoneal and inguinal lymphadenopathy of uncertain etiology.  The patient presents today for ultrasound-guided inguinal lymph node biopsy for tissue diagnostic purposes. The patient is accompanied by her husband others as her own historian.  The patient reports nausea and overall feeling poorly since at least fall of last year. She reports an approximately 30 pound weight loss since that time. She denies blood in her stool. She denies vaginal bleeding.  Past Medical History  Diagnosis Date  . Hypertension   . Carpal tunnel syndrome   . Lymphadenopathy 10/25/15  . Decreased appetite   . Weight loss     30 lb weight loss since October 2016  . Anxiety   . Depression     Past Surgical History  Procedure Laterality Date  . Carpal tunnel release Left   . Skin cancer excision    . Flexible sigmoidoscopy  05/2015    performed by Dr. Petra Kuba,    Allergies: Ibuprofen and Tape  Medications: Prior to Admission medications   Medication Sig Start Date End Date Taking? Authorizing Provider  amLODipine (NORVASC) 10 MG tablet Take 1 tablet by mouth daily. 07/23/15  Yes Historical Provider, MD  aspirin EC 81 MG tablet Take 81 mg by mouth daily.   Yes Historical Provider, MD  hydrochlorothiazide (HYDRODIURIL) 25 MG tablet Take 1 tablet by mouth daily. 07/23/15  Yes Historical Provider, MD  losartan (COZAAR) 100 MG tablet Take 1 tablet by mouth daily. 07/23/15  Yes Historical Provider, MD  ondansetron (ZOFRAN) 4 MG tablet Take by mouth. Reported on 10/28/2015    Yes Historical Provider, MD  promethazine (PHENERGAN) 25 MG tablet Take 1 tablet (25 mg total) by mouth every 6 (six) hours as needed for nausea or vomiting. 10/25/15  Yes Earleen Newport, MD  sulfamethoxazole-trimethoprim (BACTRIM DS) 800-160 MG tablet Take 1 tablet by mouth 2 (two) times daily. Patient not taking: Reported on 10/29/2015 10/25/15   Earleen Newport, MD     History reviewed. No pertinent family history.  Social History   Social History  . Marital Status: Divorced    Spouse Name: N/A  . Number of Children: N/A  . Years of Education: N/A   Social History Main Topics  . Smoking status: Never Smoker   . Smokeless tobacco: Never Used  . Alcohol Use: No  . Drug Use: No  . Sexual Activity: No   Other Topics Concern  . None   Social History Narrative    ECOG Status: 1 - Symptomatic but completely ambulatory  Review of Systems: A 12 point ROS discussed and pertinent positives are indicated in the HPI above.  All other systems are negative.  Review of Systems  Constitutional: Positive for chills, activity change, appetite change, fatigue and unexpected weight change. Negative for fever.  Respiratory: Positive for shortness of breath.        Patient amidst the terms of breath with activity. Easily fatigued.  Cardiovascular: Negative.   Gastrointestinal: Positive for nausea. Negative for abdominal pain, diarrhea, constipation, blood in stool and abdominal distention.  Endocrine: Positive for cold intolerance.  Genitourinary: Negative.  Skin: Negative.     Vital Signs: BP 154/89 mmHg  Pulse 80  Temp(Src) 98.8 F (37.1 C)  Resp 21  Physical Exam  Constitutional: She appears well-developed and well-nourished.  HENT:  Head: Normocephalic and atraumatic.  Cardiovascular: Normal rate and regular rhythm.   Pulmonary/Chest: Effort normal.  Skin: Skin is warm and dry.  Psychiatric: She has a normal mood and affect. Her behavior is normal.  Nursing note and  vitals reviewed.   Mallampati Score:     Imaging: Dg Chest 1 View  10/25/2015  CLINICAL DATA:  Patient states that she has had worsening nausea and vomiting since October which has worsened this past week and especially today. Patient states that she visited her PCP for symptoms and was prescribed nausea medication which has not helped. Patient states that almost everything she eats or drinks comes back up within a very short time. Patient has a HX of HTN. EXAM: CHEST 1 VIEW COMPARISON:  None. FINDINGS: The heart size and mediastinal contours are within normal limits. Both lungs are clear. No pleural effusion or pneumothorax. Bony thorax intact. IMPRESSION: No active disease. Electronically Signed   By: Lajean Manes M.D.   On: 10/25/2015 14:45   Ct Abdomen Pelvis W Contrast  10/25/2015  CLINICAL DATA:  Weight loss and persistent vomiting. EXAM: CT ABDOMEN AND PELVIS WITH CONTRAST TECHNIQUE: Multidetector CT imaging of the abdomen and pelvis was performed using the standard protocol following bolus administration of intravenous contrast. CONTRAST:  160mL OMNIPAQUE IOHEXOL 300 MG/ML  SOLN COMPARISON:  None. FINDINGS: Lower chest: 6 mm pleural-based nodule at the left lung base on sequence 4, images 6. There is adjacent pleural thickening on image 5. Otherwise, the lung bases are clear without pleural effusions. Hepatobiliary: 1.6 cm low-density exophytic structure involving the inferior left hepatic lobe. This is low-density could represent a cyst. There is also punctate low-density structure along the right hepatic lobe on sequence 2, image 7 which is too small to characterize. No other liver lesions. There are small calcified gallstones without gallbladder enlargement or inflammation. No significant biliary dilatation. Pancreas: Normal appearance of the pancreas without duct dilatation or inflammation. Spleen: No acute abnormality to the spleen. Adrenals/Urinary Tract: Normal appearance of the adrenal  glands. Large central right renal cyst measuring up to 5.8 cm without hydronephrosis. Probable small cyst in the left kidney upper pole. No suspicious renal lesions. Normal appearance of the urinary bladder. Stomach/Bowel: There is a 1.6 cm round dense structure along the medial aspect of the right colon on sequence 2, image 36. Difficult to exclude a nodular structure or diverticulum involving the colon at this level. No gross abnormality to the stomach, duodenum or small bowel. No evidence for a bowel obstruction. Vascular/Lymphatic: Enlarged bilateral inguinal lymph nodes. Largest node is on the left side measuring 2.4 x 1.7 cm on sequence 2, image 72. There are enlarged bilateral pelvic lymph nodes in the external iliac nodal chains. Lesion on the right side measures 2.1 x 1.5 cm on sequence 2, image 60. Enlarged left retroperitoneal lymph node along the left side of the aortic bifurcation measures 2.0 x 1.4 cm. There are enlarged lymph nodes in the aortocaval stations. Enlarged left periaortic lymph node just below the left renal vein measures 2.0 cm in the short axis. Atherosclerotic calcifications in the aorta and iliac arteries without aneurysm. No evidence for thrombus in the IVC, renal veins and iliac veins. Reproductive: Uterus is very nodular within a unusual configuration. There are heterogeneous, low-density  and calcified structures within uterus which probably represent fibroids. The adnexal tissue appears to be adjacent to the uterus but difficult to differentiate from the uterus. Cannot exclude enlargement of the left ovary or left adnexa measuring up to 5.0 cm on sequence 2, image 59. Other: No significant free fluid. There is perirectal edema. No free air. Musculoskeletal: Multilevel degenerative disc disease with vacuum discs. IMPRESSION: Retroperitoneal lymphadenopathy in the abdomen and pelvis. There is also bilateral inguinal lymphadenopathy. Findings are concerning for a neoplastic process.  Uterus is very nodular with probable fibroid disease but difficult to differentiate ovarian tissue from the uterus and cannot exclude a neoplastic process involving the uterus or adnexa. The left inguinal lymph node is the most accessible target for tissue sampling. 1.6 cm nodular structure along the medial aspect of the right colon. This is nonspecific and could represent a diverticulum. Recommend attention to this area on follow up imaging versus screening colonoscopy. Small nodular densities along the left lung base. These are nonspecific and the differential diagnosis may change depending on the etiology for the lymphadenopathy. Gallstones. Hepatic and renal cysts. These results were called by telephone at the time of interpretation on 10/25/2015 at 3:44 pm to Dr. Reita Cliche, who verbally acknowledged these results. Electronically Signed   By: Markus Daft M.D.   On: 10/25/2015 15:49    Labs:  CBC:  Recent Labs  10/25/15 1027 10/28/15 0946  WBC 5.2 4.7  HGB 9.9* 9.9*  HCT 30.2* 29.9*  PLT 343 385    COAGS:  Recent Labs  10/28/15 0946  INR 1.32  APTT 37*    BMP:  Recent Labs  10/25/15 1027 10/28/15 0946  NA 135 131*  K 3.5 3.4*  CL 98* 103  CO2 25 23  GLUCOSE 104* 118*  BUN 14 8  CALCIUM 9.2 8.9  CREATININE 0.78 0.88  GFRNONAA >60 >60  GFRAA >60 >60    LIVER FUNCTION TESTS:  Recent Labs  10/25/15 1027 10/28/15 0946  BILITOT 0.6 0.5  AST 13* 12*  ALT 12* 9*  ALKPHOS 71 63  PROT 7.3 7.7  ALBUMIN 2.8* 3.0*    TUMOR MARKERS: No results for input(s): AFPTM, CEA, CA199, CHROMGRNA in the last 8760 hours.  Assessment and Plan:  Jennifer Berry is a 70 y.o. female without any significant past medical history who underwent a CT of the abdomen and pelvis on 10/25/2015 for the workup of nausea and weight loss where it was discovered the patient has retroperitoneal and inguinal lymphadenopathy of uncertain etiology.  The patient presents today for ultrasound-guided inguinal  lymph node biopsy for tissue diagnostic purposes. The patient is accompanied by her husband others as her own historian.  Risks and Benefits of ultrasound-guided inguinal lymph node biopsy were discussed with the patient including, but not limited to bleeding, infection, damage to adjacent structures or low yield requiring additional tests.  All of the patient's questions were answered, patient is agreeable to proceed.  Consent signed and in chart.  A copy of this report was sent to the requesting provider on this date.  Electronically Signed: Sandi Mariscal 10/29/2015, 9:05 AM   I spent a total of 15 Minutes in face to face in clinical consultation, greater than 50% of which was counseling/coordinating care for ultrasound-guided inguinal lymph node biopsy

## 2015-10-29 NOTE — Procedures (Signed)
Technically successful US guided biopsy of left inguinal lymph node.   No immediate complications.   Ronny Bacon, MD Pager #: [phone]

## 2015-10-29 NOTE — Discharge Instructions (Signed)
Needle Biopsy, Care After °Refer to this sheet in the next few weeks. These instructions provide you with information about caring for yourself after your procedure. Your health care provider may also give you more specific instructions. Your treatment has been planned according to current medical practices, but problems sometimes occur. Call your health care provider if you have any problems or questions after your procedure. °WHAT TO EXPECT AFTER THE PROCEDURE °After your procedure, it is common to have soreness, bruising, or mild pain at the biopsy site. This should go away in a few days. °HOME CARE INSTRUCTIONS °· Rest as directed by your health care provider. °· Take medicines only as directed by your health care provider. °· There are many different ways to close and cover the biopsy site, including stitches (sutures), skin glue, and adhesive strips. Follow your health care provider's instructions about: °¨ Biopsy site care. °¨ Bandage (dressing) changes and removal. °¨ Biopsy site closure removal. °· Check your biopsy site every day for signs of infection. Watch for: °¨ Redness, swelling, or pain. °¨ Fluid, blood, or pus. °SEEK MEDICAL CARE IF: °· You have a fever. °· You have redness, swelling, or pain at the biopsy site that lasts longer than a few days. °· You have fluid, blood, or pus coming from the biopsy site. °· You feel nauseous. °· You vomit. °SEEK IMMEDIATE MEDICAL CARE IF: °· You have shortness of breath. °· You have trouble breathing. °· You have chest pain.   °· You feel dizzy or you faint. °· You have bleeding that does not stop with pressure or a bandage. °· You cough up blood. °· You have pain in your abdomen. °  °This information is not intended to replace advice given to you by your health care provider. Make sure you discuss any questions you have with your health care provider. °  °Document Released: 01/08/2015 Document Reviewed: 01/08/2015 °Elsevier Interactive Patient Education ©2016  [employer] ° °

## 2015-10-31 ENCOUNTER — Other Ambulatory Visit: Payer: Self-pay | Admitting: Internal Medicine

## 2015-11-01 ENCOUNTER — Encounter: Payer: Self-pay | Admitting: *Deleted

## 2015-11-01 DIAGNOSIS — C801 Malignant (primary) neoplasm, unspecified: Secondary | ICD-10-CM | POA: Diagnosis not present

## 2015-11-02 DIAGNOSIS — C801 Malignant (primary) neoplasm, unspecified: Secondary | ICD-10-CM | POA: Diagnosis not present

## 2015-11-04 ENCOUNTER — Other Ambulatory Visit: Payer: Self-pay

## 2015-11-04 ENCOUNTER — Encounter: Payer: Self-pay | Admitting: Internal Medicine

## 2015-11-04 ENCOUNTER — Inpatient Hospital Stay (HOSPITAL_BASED_OUTPATIENT_CLINIC_OR_DEPARTMENT_OTHER): Payer: Medicare Other | Admitting: Internal Medicine

## 2015-11-04 VITALS — BP 123/81 | HR 90 | Temp 97.7°F | Resp 18 | Ht 66.0 in | Wt 153.7 lb

## 2015-11-04 DIAGNOSIS — K8020 Calculus of gallbladder without cholecystitis without obstruction: Secondary | ICD-10-CM

## 2015-11-04 DIAGNOSIS — Z79899 Other long term (current) drug therapy: Secondary | ICD-10-CM

## 2015-11-04 DIAGNOSIS — R1909 Other intra-abdominal and pelvic swelling, mass and lump: Secondary | ICD-10-CM

## 2015-11-04 DIAGNOSIS — G5600 Carpal tunnel syndrome, unspecified upper limb: Secondary | ICD-10-CM

## 2015-11-04 DIAGNOSIS — R590 Localized enlarged lymph nodes: Secondary | ICD-10-CM

## 2015-11-04 DIAGNOSIS — I2510 Atherosclerotic heart disease of native coronary artery without angina pectoris: Secondary | ICD-10-CM

## 2015-11-04 DIAGNOSIS — D509 Iron deficiency anemia, unspecified: Secondary | ICD-10-CM | POA: Diagnosis not present

## 2015-11-04 DIAGNOSIS — C189 Malignant neoplasm of colon, unspecified: Secondary | ICD-10-CM

## 2015-11-04 DIAGNOSIS — I10 Essential (primary) hypertension: Secondary | ICD-10-CM

## 2015-11-04 DIAGNOSIS — C801 Malignant (primary) neoplasm, unspecified: Secondary | ICD-10-CM | POA: Diagnosis not present

## 2015-11-04 DIAGNOSIS — K769 Liver disease, unspecified: Secondary | ICD-10-CM | POA: Diagnosis not present

## 2015-11-04 DIAGNOSIS — R112 Nausea with vomiting, unspecified: Secondary | ICD-10-CM

## 2015-11-04 DIAGNOSIS — R61 Generalized hyperhidrosis: Secondary | ICD-10-CM

## 2015-11-04 DIAGNOSIS — R634 Abnormal weight loss: Secondary | ICD-10-CM

## 2015-11-04 DIAGNOSIS — R1900 Intra-abdominal and pelvic swelling, mass and lump, unspecified site: Secondary | ICD-10-CM

## 2015-11-04 DIAGNOSIS — R630 Anorexia: Secondary | ICD-10-CM

## 2015-11-04 DIAGNOSIS — D6489 Other specified anemias: Secondary | ICD-10-CM

## 2015-11-04 DIAGNOSIS — N281 Cyst of kidney, acquired: Secondary | ICD-10-CM

## 2015-11-04 DIAGNOSIS — Z7982 Long term (current) use of aspirin: Secondary | ICD-10-CM

## 2015-11-04 DIAGNOSIS — F418 Other specified anxiety disorders: Secondary | ICD-10-CM

## 2015-11-04 LAB — OCCULT BLOOD X 1 CARD TO LAB, STOOL
Fecal Occult Bld: NEGATIVE
Fecal Occult Bld: NEGATIVE

## 2015-11-04 MED ORDER — MEGESTROL ACETATE 625 MG/5ML PO SUSP: 625.0000 mg | Freq: Every day | ORAL | Status: DC

## 2015-11-04 NOTE — Progress Notes (Signed)
Pt here to get her pathology results.  Still no appetite. And per pt cont. Wt loss. She was drinking enusre/boost but got tired of drinking them.

## 2015-11-04 NOTE — Progress Notes (Signed)
[place] OFFICE PROGRESS NOTE  Patient Care Team: Petra Kuba, MD as PCP - General (Family Medicine)   SUMMARY OF ONCOLOGIC HISTORY:  # FEB 2017- Lymphadenopathy-RP/Ingiunal LN- s/p Bx-positive for malignancy/adeno ca [prelim-Dr.Baker] ; CT chest- bil pul nodules [sub-cm]  INTERVAL HISTORY:  70 year old female patient with above history of lymphadenopathy is here to review the results of her CAT scan/biopsy results. She is accompanied by family.   She continues to have poor appetite. Nausea/ intermittent vomiting. Denies any dysphagia. Denies any blood in stools. She feels weak all over. Denies any pain.  REVIEW OF SYSTEMS:  A complete 10 point review of system is done which is negative except mentioned above/history of present illness.   PAST MEDICAL HISTORY :  Past Medical History  Diagnosis Date  . Hypertension   . Carpal tunnel syndrome   . Lymphadenopathy 10/25/15  . Decreased appetite   . Weight loss     30 lb weight loss since October 2016  . Anxiety   . Depression     PAST SURGICAL HISTORY :   Past Surgical History  Procedure Laterality Date  . Carpal tunnel release Left   . Skin cancer excision    . Flexible sigmoidoscopy  05/2015    performed by Dr. Petra Kuba,    FAMILY HISTORY :   Family History  Problem Relation Age of Onset  . Breast cancer Sister   . Hypertension Sister   . Hypertension Brother   . Ovarian cancer Paternal Aunt   . Diabetes Maternal Grandmother   . Breast cancer Daughter   . Breast cancer Cousin     maternal    SOCIAL HISTORY:   Social History  Substance Use Topics  . Smoking status: Never Smoker   . Smokeless tobacco: Never Used  . Alcohol Use: No    ALLERGIES:  is allergic to ibuprofen and tape.  MEDICATIONS:  Current Outpatient Prescriptions  Medication Sig Dispense Refill  . amLODipine (NORVASC) 10 MG tablet Take 1 tablet by mouth daily.    Marland Kitchen aspirin EC 81 MG tablet Take 81 mg by mouth  daily.    . hydrochlorothiazide (HYDRODIURIL) 25 MG tablet Take 1 tablet by mouth daily.    Marland Kitchen losartan (COZAAR) 100 MG tablet Take 1 tablet by mouth daily.    . ondansetron (ZOFRAN) 4 MG tablet Take by mouth. Reported on 10/28/2015    . promethazine (PHENERGAN) 25 MG tablet Take 1 tablet (25 mg total) by mouth every 6 (six) hours as needed for nausea or vomiting. 20 tablet 0  . megestrol (MEGACE ES) 625 MG/5ML suspension Take 5 mLs (625 mg total) by mouth daily. 150 mL 0   No current facility-administered medications for this visit.    PHYSICAL EXAMINATION: ECOG PERFORMANCE STATUS: 1 - Symptomatic but completely ambulatory  BP 123/81 mmHg  Pulse 90  Temp(Src) 97.7 F (36.5 C) (Tympanic)  Resp 18  Ht 5\' 6"  (1.676 m)  Wt 153 lb 10.6 oz (69.7 kg)  BMI 24.81 kg/m2  Filed Weights   11/04/15 0845  Weight: 153 lb 10.6 oz (69.7 kg)    GENERAL: Well-nourished well-developed; Alert, no distress and comfortable.   Accompanied by family.  LABORATORY DATA:  I have reviewed the data as listed    Component Value Date/Time   NA 131* 10/28/2015 0946   K 3.4* 10/28/2015 0946   CL 103 10/28/2015 0946   CO2 23 10/28/2015 0946   GLUCOSE 118* 10/28/2015 [id number]  BUN 8 10/28/2015 0946   CREATININE 0.88 10/28/2015 0946   CALCIUM 8.9 10/28/2015 0946   PROT 7.7 10/28/2015 0946   ALBUMIN 3.0* 10/28/2015 0946   AST 12* 10/28/2015 0946   ALT 9* 10/28/2015 0946   ALKPHOS 63 10/28/2015 0946   BILITOT 0.5 10/28/2015 0946   GFRNONAA >60 10/28/2015 0946   GFRAA >60 10/28/2015 0946    No results found for: SPEP, UPEP  Lab Results  Component Value Date   WBC 4.7 10/28/2015   NEUTROABS 3.4 10/28/2015   HGB 9.9* 10/28/2015   HCT 29.9* 10/28/2015   MCV 78.2* 10/28/2015   PLT 385 10/28/2015      Chemistry      Component Value Date/Time   NA 131* 10/28/2015 0946   K 3.4* 10/28/2015 0946   CL 103 10/28/2015 0946   CO2 23 10/28/2015 0946   BUN 8 10/28/2015 0946   CREATININE 0.88 10/28/2015  0946      Component Value Date/Time   CALCIUM 8.9 10/28/2015 0946   ALKPHOS 63 10/28/2015 0946   AST 12* 10/28/2015 0946   ALT 9* 10/28/2015 0946   BILITOT 0.5 10/28/2015 0946     IMPRESSION: 1. Multiple bilateral pulmonary nodules suspicious for metastatic disease. None of these lesions are big enough to biopsy or PET. Recommend correlation with recent lymph node biopsy. 2. No mediastinal or hilar mass or lymphadenopathy. 3. Mild emphysematous changes and pulmonary scarring.   ASSESSMENT & PLAN:   # Intra-abdominal lymphadenopathy-retroperitoneal/pelvic inguinal- status post ultrasound-guided lymph node biopsy- suggestive for carcinoma/awaiting for IHC for further delineation. Given the sub-cm lung nodules- very suggestive for metastases to lungs.   # Spoke to pathology. I reviewed the preliminary results/CT imaging- reviewed the images myself and with the family. Discussed that the most likely treatment options would include chemotherapy- but again final recommendations to based on final pathology.  # Poor appetite weight loss- nausea- likely secondary to malignancy. Recommend Megace.  # Anemia microcytic- awaiting stool cards; however iron studies suggestive of anemia of chronic disease/less likely from anemia of iron deficiency.  # Will also recommend evaluation with gynecology oncology this week; and also follow-up with me in the end of the week.   # 25 minutes face-to-face with the patient discussing the above plan of care; more than 50% of time spent on prognosis/ natural history; counseling and coordination.        Cammie Sickle, MD 11/04/2015 9:14 AM

## 2015-11-04 NOTE — Progress Notes (Signed)
RN present during md visit. Pt here for pathology results discussion. Her daughter and significant other-Larry was present for this discussion. A decision was made to refer patient to Dr. Theora Gianotti for this Wednesday, 11/06/15. I instructed pt to come at 245pm. I also provided the patient with the new patient gyn questionnaire form. The patient will return to the clinic on Friday 11/08/15 for further treatment discussion.

## 2015-11-05 LAB — SURGICAL PATHOLOGY

## 2015-11-06 ENCOUNTER — Encounter: Payer: Self-pay | Admitting: Obstetrics and Gynecology

## 2015-11-06 ENCOUNTER — Telehealth: Payer: Self-pay | Admitting: *Deleted

## 2015-11-06 ENCOUNTER — Inpatient Hospital Stay: Payer: Medicare Other | Attending: Obstetrics and Gynecology | Admitting: Obstetrics and Gynecology

## 2015-11-06 ENCOUNTER — Inpatient Hospital Stay (HOSPITAL_BASED_OUTPATIENT_CLINIC_OR_DEPARTMENT_OTHER): Payer: Medicare Other | Admitting: Internal Medicine

## 2015-11-06 ENCOUNTER — Encounter: Payer: Self-pay | Admitting: *Deleted

## 2015-11-06 VITALS — BP 130/77 | HR 108 | Temp 98.5°F | Ht 66.0 in | Wt 150.8 lb

## 2015-11-06 DIAGNOSIS — Z79899 Other long term (current) drug therapy: Secondary | ICD-10-CM | POA: Insufficient documentation

## 2015-11-06 DIAGNOSIS — Z7982 Long term (current) use of aspirin: Secondary | ICD-10-CM

## 2015-11-06 DIAGNOSIS — Z7689 Persons encountering health services in other specified circumstances: Secondary | ICD-10-CM | POA: Diagnosis not present

## 2015-11-06 DIAGNOSIS — R591 Generalized enlarged lymph nodes: Secondary | ICD-10-CM | POA: Diagnosis not present

## 2015-11-06 DIAGNOSIS — C801 Malignant (primary) neoplasm, unspecified: Secondary | ICD-10-CM | POA: Insufficient documentation

## 2015-11-06 DIAGNOSIS — F418 Other specified anxiety disorders: Secondary | ICD-10-CM

## 2015-11-06 DIAGNOSIS — R630 Anorexia: Secondary | ICD-10-CM | POA: Diagnosis not present

## 2015-11-06 DIAGNOSIS — R918 Other nonspecific abnormal finding of lung field: Secondary | ICD-10-CM | POA: Diagnosis not present

## 2015-11-06 DIAGNOSIS — R599 Enlarged lymph nodes, unspecified: Secondary | ICD-10-CM

## 2015-11-06 DIAGNOSIS — Z8041 Family history of malignant neoplasm of ovary: Secondary | ICD-10-CM

## 2015-11-06 DIAGNOSIS — Z803 Family history of malignant neoplasm of breast: Secondary | ICD-10-CM | POA: Diagnosis not present

## 2015-11-06 DIAGNOSIS — Z85828 Personal history of other malignant neoplasm of skin: Secondary | ICD-10-CM | POA: Diagnosis not present

## 2015-11-06 DIAGNOSIS — Z5111 Encounter for antineoplastic chemotherapy: Secondary | ICD-10-CM | POA: Diagnosis present

## 2015-11-06 DIAGNOSIS — G5600 Carpal tunnel syndrome, unspecified upper limb: Secondary | ICD-10-CM

## 2015-11-06 DIAGNOSIS — R634 Abnormal weight loss: Secondary | ICD-10-CM | POA: Diagnosis not present

## 2015-11-06 DIAGNOSIS — R112 Nausea with vomiting, unspecified: Secondary | ICD-10-CM

## 2015-11-06 DIAGNOSIS — A630 Anogenital (venereal) warts: Secondary | ICD-10-CM | POA: Insufficient documentation

## 2015-11-06 DIAGNOSIS — C786 Secondary malignant neoplasm of retroperitoneum and peritoneum: Secondary | ICD-10-CM | POA: Diagnosis not present

## 2015-11-06 DIAGNOSIS — C778 Secondary and unspecified malignant neoplasm of lymph nodes of multiple regions: Secondary | ICD-10-CM | POA: Diagnosis not present

## 2015-11-06 DIAGNOSIS — N852 Hypertrophy of uterus: Secondary | ICD-10-CM | POA: Diagnosis not present

## 2015-11-06 DIAGNOSIS — C541 Malignant neoplasm of endometrium: Secondary | ICD-10-CM

## 2015-11-06 DIAGNOSIS — R590 Localized enlarged lymph nodes: Secondary | ICD-10-CM

## 2015-11-06 DIAGNOSIS — R21 Rash and other nonspecific skin eruption: Secondary | ICD-10-CM | POA: Diagnosis not present

## 2015-11-06 DIAGNOSIS — I10 Essential (primary) hypertension: Secondary | ICD-10-CM | POA: Diagnosis not present

## 2015-11-06 DIAGNOSIS — R110 Nausea: Secondary | ICD-10-CM

## 2015-11-06 HISTORY — DX: Malignant neoplasm of endometrium: C54.1

## 2015-11-06 NOTE — Telephone Encounter (Signed)
Called [facility]. [phone].  spoke with Vaughan Basta and was transferred to [employer] the [employer] team. Initiated prior Josem Kaufmann for Megace. Pt has had persistent wt loss over the last 3 months, nausea and vomiting, decreased appetite, and endometrial cancer.  Prior auth request will take 72 hrs for approval.

## 2015-11-06 NOTE — Patient Instructions (Addendum)
PET Scan A PET scan, also called positron emission tomography, is a test that creates pictures of the inside of the body. A PET scan requires a small dose of a harmless radioactive material to be injected into a vein. When this material combines with certain substances in the body, it produces tiny particles that can be detected by a scanner and converted into pictures.  The pictures created during a PET scan can be used to study a disease. They are often used to study cancer and cancer therapy. The colors and brightness on the pictures show different levels of organ and tissue function. For example, cancer tissue appears brighter than normal tissue on a PET scan picture. LET [facility] CARE PROVIDER KNOW ABOUT:   Any allergies you have.  All medicines you are taking, including vitamins, herbs, eye drops, creams, and over-the-counter medicines.  Previous problems you or members of your family have had with the use of anesthetics.  Any blood disorders you have.  Previous surgeries you have had.  Medical conditions you have.  If you are afraid of cramped spaces (claustrophobic). If claustrophobia is a problem, it usually can be relieved with mild sedatives or antianxiety medicines.  If you have trouble staying still for long periods of time. BEFORE THE PROCEDURE   Do not eat or drink anything after midnight on the night before the procedure or as directed by your health care provider.  Take medicines only as directed by your health care provider.  If you have diabetes, ask your health care provider for diet guidelines to control sugar (glucose) levels on the day of the test. PROCEDURE   A small amount of radioactive material will be injected into a vein. The test will begin 30-60 minutes after the injection, when the material has traveled around your body.  You will lie on a cushioned table, and the table will be moved through the center of a machine that looks like a large donut. It  will take about 30-60 minutes for the machine to produce pictures of your body. You will need to stay still during this time. AFTER THE PROCEDURE  You may resume your normal diet and activities.  Drink several 8 oz glasses of water following the test to flush the radioactive material out of your body.   This information is not intended to replace advice given to you by your health care provider. Make sure you discuss any questions you have with your health care provider.   Document Released: 02/28/2003 Document Revised: 09/14/2014 Document Reviewed: 12/06/2013 Elsevier Interactive Patient Education 2016 [employer].     Magnetic Resonance Imaging Magnetic resonance imaging (MRI) is a painless test that takes pictures of the inside of your body. This test uses a strong magnet. This test does not use X-rays or radiation. BEFORE THE PROCEDURE  You will be asked to take off all metal. This includes:  Your watch, jewelry, and other metal items.  Some makeup may have very small bits of metal and may need to be taken off.  Braces and fillings normally are not a problem. PROCEDURE  You may be given earplugs or headphones to listen to music. The machine can be noisy.  You might get a shot (injection) with a dye (contrast material) to help the MRI take better pictures.  MRI is done in a tunnel-shaped scanner. You will lie on a table that slides into the tunnel-shaped scanner. Once inside, you will still be able to talk to the person doing  the test.  You will be asked to hold very still. You will be told when you can shift position. You may have to wait a few minutes to make sure the images are readable. AFTER THE PROCEDURE  You may go back to your normal activities right away.  If you got a shot of dye, it will pass naturally through your body within a day.  Your doctor will talk to you about the results.   This information is not intended to replace advice given to you by your  health care provider. Make sure you discuss any questions you have with your health care provider.   Document Released: 09/26/2010 Document Revised: 09/14/2014 Document Reviewed: 10/19/2013 Elsevier Interactive Patient Education 2016 [employer].        Carboplatin injection What is this medicine? CARBOPLATIN (KAR boe pla tin) is a chemotherapy drug. It targets fast dividing cells, like cancer cells, and causes these cells to die. This medicine is used to treat ovarian cancer and many other cancers. This medicine may be used for other purposes; ask your health care provider or pharmacist if you have questions. What should I tell my health care provider before I take this medicine? They need to know if you have any of these conditions: -blood disorders -hearing problems -kidney disease -recent or ongoing radiation therapy -an unusual or allergic reaction to carboplatin, cisplatin, other chemotherapy, other medicines, foods, dyes, or preservatives -pregnant or trying to get pregnant -breast-feeding How should I use this medicine? This drug is usually given as an infusion into a vein. It is administered in a hospital or clinic by a specially trained health care professional. Talk to your pediatrician regarding the use of this medicine in children. Special care may be needed. Overdosage: If you think you have taken too much of this medicine contact a poison control center or emergency room at once. NOTE: This medicine is only for you. Do not share this medicine with others. What if I miss a dose? It is important not to miss a dose. Call your doctor or health care professional if you are unable to keep an appointment. What may interact with this medicine? -medicines for seizures -medicines to increase blood counts like filgrastim, pegfilgrastim, sargramostim -some antibiotics like amikacin, gentamicin, neomycin, streptomycin, tobramycin -vaccines Talk to your doctor or health care  professional before taking any of these medicines: -acetaminophen -aspirin -ibuprofen -ketoprofen -naproxen This list may not describe all possible interactions. Give your health care provider a list of all the medicines, herbs, non-prescription drugs, or dietary supplements you use. Also tell them if you smoke, drink alcohol, or use illegal drugs. Some items may interact with your medicine. What should I watch for while using this medicine? Your condition will be monitored carefully while you are receiving this medicine. You will need important blood work done while you are taking this medicine. This drug may make you feel generally unwell. This is not uncommon, as chemotherapy can affect healthy cells as well as cancer cells. Report any side effects. Continue your course of treatment even though you feel ill unless your doctor tells you to stop. In some cases, you may be given additional medicines to help with side effects. Follow all directions for their use. Call your doctor or health care professional for advice if you get a fever, chills or sore throat, or other symptoms of a cold or flu. Do not treat yourself. This drug decreases your body's ability to fight infections. Try to avoid being  around people who are sick. This medicine may increase your risk to bruise or bleed. Call your doctor or health care professional if you notice any unusual bleeding. Be careful brushing and flossing your teeth or using a toothpick because you may get an infection or bleed more easily. If you have any dental work done, tell your dentist you are receiving this medicine. Avoid taking products that contain aspirin, acetaminophen, ibuprofen, naproxen, or ketoprofen unless instructed by your doctor. These medicines may hide a fever. Do not become pregnant while taking this medicine. Women should inform their doctor if they wish to become pregnant or think they might be pregnant. There is a potential for serious side  effects to an unborn child. Talk to your health care professional or pharmacist for more information. Do not breast-feed an infant while taking this medicine. What side effects may I notice from receiving this medicine? Side effects that you should report to your doctor or health care professional as soon as possible: -allergic reactions like skin rash, itching or hives, swelling of the face, lips, or tongue -signs of infection - fever or chills, cough, sore throat, pain or difficulty passing urine -signs of decreased platelets or bleeding - bruising, pinpoint red spots on the skin, black, tarry stools, nosebleeds -signs of decreased red blood cells - unusually weak or tired, fainting spells, lightheadedness -breathing problems -changes in hearing -changes in vision -chest pain -high blood pressure -low blood counts - This drug may decrease the number of Intriago blood cells, red blood cells and platelets. You may be at increased risk for infections and bleeding. -nausea and vomiting -pain, swelling, redness or irritation at the injection site -pain, tingling, numbness in the hands or feet -problems with balance, talking, walking -trouble passing urine or change in the amount of urine Side effects that usually do not require medical attention (report to your doctor or health care professional if they continue or are bothersome): -hair loss -loss of appetite -metallic taste in the mouth or changes in taste This list may not describe all possible side effects. Call your doctor for medical advice about side effects. You may report side effects to FDA at 1-800-FDA-1088. Where should I keep my medicine? This drug is given in a hospital or clinic and will not be stored at home. NOTE: This sheet is a summary. It may not cover all possible information. If you have questions about this medicine, talk to your doctor, pharmacist, or health care provider.    2016, Elsevier/Gold Standard. (2007-11-29  14:38:05)     Paclitaxel injection What is this medicine? PACLITAXEL (PAK li TAX el) is a chemotherapy drug. It targets fast dividing cells, like cancer cells, and causes these cells to die. This medicine is used to treat ovarian cancer, breast cancer, and other cancers. This medicine may be used for other purposes; ask your health care provider or pharmacist if you have questions. What should I tell my health care provider before I take this medicine? They need to know if you have any of these conditions: -blood disorders -irregular heartbeat -infection (especially a virus infection such as chickenpox, cold sores, or herpes) -liver disease -previous or ongoing radiation therapy -an unusual or allergic reaction to paclitaxel, alcohol, polyoxyethylated castor oil, other chemotherapy agents, other medicines, foods, dyes, or preservatives -pregnant or trying to get pregnant -breast-feeding How should I use this medicine? This drug is given as an infusion into a vein. It is administered in a hospital or clinic by a specially  trained health care professional. Talk to your pediatrician regarding the use of this medicine in children. Special care may be needed. Overdosage: If you think you have taken too much of this medicine contact a poison control center or emergency room at once. NOTE: This medicine is only for you. Do not share this medicine with others. What if I miss a dose? It is important not to miss your dose. Call your doctor or health care professional if you are unable to keep an appointment. What may interact with this medicine? Do not take this medicine with any of the following medications: -disulfiram -metronidazole This medicine may also interact with the following medications: -cyclosporine -diazepam -ketoconazole -medicines to increase blood counts like filgrastim, pegfilgrastim, sargramostim -other chemotherapy drugs like cisplatin, doxorubicin, epirubicin, etoposide,  teniposide, vincristine -quinidine -testosterone -vaccines -verapamil Talk to your doctor or health care professional before taking any of these medicines: -acetaminophen -aspirin -ibuprofen -ketoprofen -naproxen This list may not describe all possible interactions. Give your health care provider a list of all the medicines, herbs, non-prescription drugs, or dietary supplements you use. Also tell them if you smoke, drink alcohol, or use illegal drugs. Some items may interact with your medicine. What should I watch for while using this medicine? Your condition will be monitored carefully while you are receiving this medicine. You will need important blood work done while you are taking this medicine. This drug may make you feel generally unwell. This is not uncommon, as chemotherapy can affect healthy cells as well as cancer cells. Report any side effects. Continue your course of treatment even though you feel ill unless your doctor tells you to stop. This medicine can cause serious allergic reactions. To reduce your risk you will need to take other medicine(s) before treatment with this medicine. In some cases, you may be given additional medicines to help with side effects. Follow all directions for their use. Call your doctor or health care professional for advice if you get a fever, chills or sore throat, or other symptoms of a cold or flu. Do not treat yourself. This drug decreases your body's ability to fight infections. Try to avoid being around people who are sick. This medicine may increase your risk to bruise or bleed. Call your doctor or health care professional if you notice any unusual bleeding. Be careful brushing and flossing your teeth or using a toothpick because you may get an infection or bleed more easily. If you have any dental work done, tell your dentist you are receiving this medicine. Avoid taking products that contain aspirin, acetaminophen, ibuprofen, naproxen, or  ketoprofen unless instructed by your doctor. These medicines may hide a fever. Do not become pregnant while taking this medicine. Women should inform their doctor if they wish to become pregnant or think they might be pregnant. There is a potential for serious side effects to an unborn child. Talk to your health care professional or pharmacist for more information. Do not breast-feed an infant while taking this medicine. Men are advised not to father a child while receiving this medicine. This product may contain alcohol. Ask your pharmacist or healthcare provider if this medicine contains alcohol. Be sure to tell all healthcare providers you are taking this medicine. Certain medicines, like metronidazole and disulfiram, can cause an unpleasant reaction when taken with alcohol. The reaction includes flushing, headache, nausea, vomiting, sweating, and increased thirst. The reaction can last from 30 minutes to several hours. What side effects may I notice from receiving this medicine?  Side effects that you should report to your doctor or health care professional as soon as possible: -allergic reactions like skin rash, itching or hives, swelling of the face, lips, or tongue -low blood counts - This drug may decrease the number of Cadavid blood cells, red blood cells and platelets. You may be at increased risk for infections and bleeding. -signs of infection - fever or chills, cough, sore throat, pain or difficulty passing urine -signs of decreased platelets or bleeding - bruising, pinpoint red spots on the skin, black, tarry stools, nosebleeds -signs of decreased red blood cells - unusually weak or tired, fainting spells, lightheadedness -breathing problems -chest pain -high or low blood pressure -mouth sores -nausea and vomiting -pain, swelling, redness or irritation at the injection site -pain, tingling, numbness in the hands or feet -slow or irregular heartbeat -swelling of the ankle, feet,  hands Side effects that usually do not require medical attention (report to your doctor or health care professional if they continue or are bothersome): -bone pain -complete hair loss including hair on your head, underarms, pubic hair, eyebrows, and eyelashes -changes in the color of fingernails -diarrhea -loosening of the fingernails -loss of appetite -muscle or joint pain -red flush to skin -sweating This list may not describe all possible side effects. Call your doctor for medical advice about side effects. You may report side effects to FDA at 1-800-FDA-1088. Where should I keep my medicine? This drug is given in a hospital or clinic and will not be stored at home. NOTE: This sheet is a summary. It may not cover all possible information. If you have questions about this medicine, talk to your doctor, pharmacist, or health care provider.    2016, Elsevier/Gold Standard. (2015-04-11 13:02:56)

## 2015-11-06 NOTE — Progress Notes (Signed)
Gynecologic Oncology Consult Visit   Referring Provider: Cammie Sickle, MD  Chief Concern: Evaluation for gynecologic malignancy  Subjective:  Jennifer Berry is a 70 y.o. female who is seen in consultation from Dr. Rogue Bussing for evaluation for gynecologic malignancy in the setting of inguinal lymphadenopathy with Bx-positive for adenocarcinoma and widely metastatic disease.   CT A/P scan 10/25/2015 Reproductive: Uterus is very nodular within a unusual configuration. There are heterogeneous, low-density and calcified structures within uterus which probably represent fibroids. The adnexal tissue appears to be adjacent to the uterus but difficult to differentiate from the uterus. Cannot exclude enlargement of the left ovary or left adnexa measuring up to 5.0 cm on sequence 2, image 59.   IMPRESSION: Retroperitoneal lymphadenopathy in the abdomen and pelvis. There is also bilateral inguinal lymphadenopathy. Findings are concerning for a neoplastic process. Uterus is very nodular with probable fibroid disease but difficult to differentiate ovarian tissue from the uterus and cannot exclude a neoplastic process involving the uterus or adnexa. The left inguinal lymph node is the most accessible target for tissue sampling.  1.6 cm nodular structure along the medial aspect of the right colon. This is nonspecific and could represent a diverticulum. Recommend attention to this area on follow up imaging versus screening colonoscopy.  Small nodular densities along the left lung base. These are nonspecific and the differential diagnosis may change depending on the etiology for the lymphadenopathy.  Gallstones.  Hepatic and renal cysts.  PATHOLOGY 10/29/2015 DIAGNOSIS:  A. LYMPH NODE, LEFT INGUINAL; CORE BIOPSY:  - METASTATIC HIGH GRADE SEROUS CARCINOMA, FAVOR ENDOMETRIAL ORIGIN (SEE  COMMENT).   Comment:  Immunohistochemical stains were performed to evaluate tumor site of   origin.  Positive: CK7, p53, p16  Negative: WT-1, TTF-1, CDX-2, CK20, ER, PR  The histologic features and staining pattern support the diagnosis. The  control slides stained appropriately. Results were called to Dr.  Rogue Bussing at 2 pm on 11/05/15.    CT chest scan 10/29/2015 IMPRESSION: 1. Multiple bilateral pulmonary nodules suspicious for metastatic disease. None of these lesions are big enough to biopsy or PET. Recommend correlation with recent lymph node biopsy. 2. No mediastinal or hilar mass or lymphadenopathy. 3. Mild emphysematous changes and pulmonary scarring.  She denies vaginal bleeding or pelvic pain. She has not had a Pap smear in years, but prior to age 51 she was compliant with annual exams. She does not have a h/o abnormal Pap smears. She does not recall being told she had leiomyoma in the past.    Problem List: Patient Active Problem List   Diagnosis Date Noted  . Retroperitoneal lymphadenopathy 10/28/2015    Past Medical History: Past Medical History  Diagnosis Date  . Hypertension   . Carpal tunnel syndrome   . Lymphadenopathy 10/25/15  . Decreased appetite   . Weight loss     30 lb weight loss since October 2016  . Anxiety   . Depression   . Endometrial cancer ([place])   . Nausea & vomiting     Past Surgical History: Past Surgical History  Procedure Laterality Date  . Carpal tunnel release Left   . Skin cancer excision    . Flexible sigmoidoscopy  05/2015    performed by Dr. Petra Kuba,    Past Gynecologic History:  As per HPI  OB History:  OB History  Gravida Para Term Preterm AB SAB TAB Ectopic Multiple Living  1 1            #  Outcome Date GA Lbr Len/2nd Weight Sex Delivery Anes PTL Lv  1 Para               Family History: Family History  Problem Relation Age of Onset  . Breast cancer Sister   . Hypertension Sister   . Hypertension Brother   . Ovarian cancer Paternal Aunt   . Diabetes Maternal Grandmother   . Breast  cancer Daughter   . Breast cancer Cousin     maternal    Social History: Social History   Social History  . Marital Status: Divorced    Spouse Name: N/A  . Number of Children: N/A  . Years of Education: N/A   Occupational History  . Not on file.   Social History Main Topics  . Smoking status: Never Smoker   . Smokeless tobacco: Never Used  . Alcohol Use: No  . Drug Use: No  . Sexual Activity: No   Other Topics Concern  . Not on file   Social History Narrative    Allergies: Allergies  Allergen Reactions  . Ibuprofen Swelling  . Tape Rash    Current Medications: Current Outpatient Prescriptions  Medication Sig Dispense Refill  . amLODipine (NORVASC) 10 MG tablet Take 1 tablet by mouth daily.    Marland Kitchen aspirin EC 81 MG tablet Take 81 mg by mouth daily.    . baclofen (LIORESAL) 10 MG tablet Take by mouth.    . hydrochlorothiazide (HYDRODIURIL) 25 MG tablet Take 1 tablet by mouth daily.    Marland Kitchen losartan (COZAAR) 100 MG tablet Take 1 tablet by mouth daily.    . megestrol (MEGACE ES) 625 MG/5ML suspension Take 5 mLs (625 mg total) by mouth daily. 150 mL 0  . ondansetron (ZOFRAN) 4 MG tablet Take by mouth. Reported on 10/28/2015    . promethazine (PHENERGAN) 25 MG tablet Take 1 tablet (25 mg total) by mouth every 6 (six) hours as needed for nausea or vomiting. 20 tablet 0   No current facility-administered medications for this visit.    Review of Systems General: fatigue, changes in weight or appetite with weight loss and weakness Skin: negative Eyes: negative HEENT: negative for, change in hearing, tinnitus, voice changes Breasts: negative Pulmonary: dyspnea Cardiac: negative for, palpitations, pain Gastrointestinal: positive for nausea, negative for, vomiting, constipation, diarrhea, hematemesis, hematochezia Genitourinary/Sexual: negative for, dysuria, hesitancy, retention Ob/Gyn: negative for, irregular bleeding, pain Musculoskeletal: negative for, pain, stiffness,  swelling, range of motion limitation Hematology: negative for, easy bruising, bleeding Neurologic/Psych: negative for, headaches, seizures, paralysis  Objective:  Physical Examination:  BP 130/77 mmHg  Pulse 108  Temp(Src) 98.5 F (36.9 C) (Oral)  Ht 5' 6"  (1.676 m)  Wt 150 lb 12.7 oz (68.4 kg)  BMI 24.35 kg/m2   ECOG Performance Status: 0 - Asymptomatic  General appearance: alert, cooperative and appears stated age HEENT:PERRLA, extra ocular movement intact and sclera clear, anicteric Lymph node survey: non-palpable [place] and axillary, palpable enlarged node size 2-3 cm left inguinal and shotty right inguinal Cardiovascular: regular rate and rhythm Respiratory: normal air entry, lungs clear to auscultation Breast exam: breasts appear normal, no suspicious masses, no skin or nipple changes or axillary nodes. Abdomen: soft, non-tender, without masses or organomegaly, no hernias and no ascites Back: inspection of back is normal Extremities: extremities normal, atraumatic, no cyanosis or edema Skin exam - normal coloration and turgor, no rashes, no suspicious skin lesions noted. Neurological exam reveals alert, oriented, normal speech, no focal findings or movement disorder noted.  Pelvic: exam chaperoned by nurse;  Vulva: normal appearing vulva with no masses, tenderness or lesions; Vagina: normal vagina; Adnexa: unable to determine due to uterine size; Uterus: enlarged to at least 12 week size but could not determine due to patient tenderness. Bulky and firm with limited mobility; Cervix: no lesionsbut firm on exam Pap obtained; Rectal: normal rectal, no masses    Radiologic Imaging: Reviewed    Assessment:  GIADA SCHOPPE is a 70 y.o. female diagnosed with metastatic adenocarcinoma with bulky uterus on imaging and exam which may represent leiomyoma versus malignancy. 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 The  positive p16 staining more likely reflects cervical etiology. Imaging also noted possible enlarged left ovarian mass.   Medical co-morbidities complicating care: HTN.  Plan:   Problem List Items Addressed This Visit    None    Visit Diagnoses    Adenopathy    -  Primary    Relevant Orders    MR Pelvis W Wo Contrast    Pap liquid-based and HPV (high risk)    NM PET Image Initial (PI) Skull Base To Thigh    Enlarged uterus        Relevant Orders    MR Pelvis W Wo Contrast    NM PET Image Initial (PI) Skull Base To Thigh       We discussed options for management and I recommended pelvic MRI, PET, and diagnostic PAP/HPV. Once we have this information we can hopefully identify the primary site of disease. I did not perform an EMBx as she is not having any bleeding and the exam was very uncomfortable for her. If her PET demonstrates positive uterine FDG aviditiy then EMBx would be worthwhile.   I discussed treatment options with Dr. Rogue Bussing and given the findings treatment with paclitaxel/carboplatin would be best.  The patient's diagnosis, an outline of the further diagnostic and laboratory studies which will be required, the recommendation, and alternatives were discussed.  All questions were answered to the patient's satisfaction.  A total of 90 minutes were spent with the patient/family today; 60% was spent in education, counseling and coordination of care.    Gillis Ends, MD    CC:  Cammie Sickle, MD

## 2015-11-06 NOTE — Progress Notes (Signed)
[facility] OFFICE PROGRESS NOTE  Patient Care Team: Petra Kuba, MD as PCP - General (Family Medicine) Clent Jacks, RN as Registered Nurse   SUMMARY OF ONCOLOGIC HISTORY:  # FEB 2017- STAGE IV ENDOMETRIAL ADENO CA [s/p Inguinal LN Bx] Lymphadenopathy-RP/Ingiunal LN; CT chest- bil pul nodules [sub-cm]  INTERVAL HISTORY:  70 year old female patient with with above history of metastatic likely endometrial cancer is here to review the treatment options. In the interim patient has met with gynecology oncology- awaiting a uterine biopsy/scans.  She continues to have poor appetite. Nausea/ intermittent vomiting. Denies any dysphagia. Denies any blood in stools/menorrhagia. She feels weak all over. Denies any pain.  REVIEW OF SYSTEMS:  A complete 10 point review of system is done which is negative except mentioned above/history of present illness.   PAST MEDICAL HISTORY :  Past Medical History  Diagnosis Date  . Hypertension   . Carpal tunnel syndrome   . Lymphadenopathy 10/25/15  . Decreased appetite   . Weight loss     30 lb weight loss since October 2016  . Anxiety   . Depression   . Nausea & vomiting     PAST SURGICAL HISTORY :   Past Surgical History  Procedure Laterality Date  . Carpal tunnel release Left   . Skin cancer excision    . Flexible sigmoidoscopy  05/2015    performed by Dr. Petra Kuba,    FAMILY HISTORY :   Family History  Problem Relation Age of Onset  . Breast cancer Sister   . Hypertension Sister   . Hypertension Brother   . Ovarian cancer Paternal Aunt   . Diabetes Maternal Grandmother   . Breast cancer Daughter   . Breast cancer Cousin     maternal    SOCIAL HISTORY:   Social History  Substance Use Topics  . Smoking status: Never Smoker   . Smokeless tobacco: Never Used  . Alcohol Use: No    ALLERGIES:  is allergic to ibuprofen and tape.  MEDICATIONS:  Current Outpatient Prescriptions  Medication Sig  Dispense Refill  . amLODipine (NORVASC) 10 MG tablet Take 1 tablet by mouth daily.    Marland Kitchen aspirin EC 81 MG tablet Take 81 mg by mouth daily.    . baclofen (LIORESAL) 10 MG tablet Take by mouth.    . hydrochlorothiazide (HYDRODIURIL) 25 MG tablet Take 1 tablet by mouth daily.    Marland Kitchen losartan (COZAAR) 100 MG tablet Take 1 tablet by mouth daily.    . megestrol (MEGACE ES) 625 MG/5ML suspension Take 5 mLs (625 mg total) by mouth daily. 150 mL 0  . ondansetron (ZOFRAN) 4 MG tablet Take by mouth. Reported on 10/28/2015    . promethazine (PHENERGAN) 25 MG tablet Take 1 tablet (25 mg total) by mouth every 6 (six) hours as needed for nausea or vomiting. 20 tablet 0   No current facility-administered medications for this visit.    PHYSICAL EXAMINATION: ECOG PERFORMANCE STATUS: 1 - Symptomatic but completely ambulatory  There were no vitals taken for this visit.  There were no vitals filed for this visit.  GENERAL: Well-nourished well-developed; Alert, no distress and comfortable.   Accompanied by family.  LABORATORY DATA:  I have reviewed the data as listed    Component Value Date/Time   NA 131* 10/28/2015 0946   K 3.4* 10/28/2015 0946   CL 103 10/28/2015 0946   CO2 23 10/28/2015 0946   GLUCOSE 118* 10/28/2015 8101  BUN 8 10/28/2015 0946   CREATININE 0.88 10/28/2015 0946   CALCIUM 8.9 10/28/2015 0946   PROT 7.7 10/28/2015 0946   ALBUMIN 3.0* 10/28/2015 0946   AST 12* 10/28/2015 0946   ALT 9* 10/28/2015 0946   ALKPHOS 63 10/28/2015 0946   BILITOT 0.5 10/28/2015 0946   GFRNONAA >60 10/28/2015 0946   GFRAA >60 10/28/2015 0946    No results found for: SPEP, UPEP  Lab Results  Component Value Date   WBC 4.7 10/28/2015   NEUTROABS 3.4 10/28/2015   HGB 9.9* 10/28/2015   HCT 29.9* 10/28/2015   MCV 78.2* 10/28/2015   PLT 385 10/28/2015      Chemistry      Component Value Date/Time   NA 131* 10/28/2015 0946   K 3.4* 10/28/2015 0946   CL 103 10/28/2015 0946   CO2 23 10/28/2015  0946   BUN 8 10/28/2015 0946   CREATININE 0.88 10/28/2015 0946      Component Value Date/Time   CALCIUM 8.9 10/28/2015 0946   ALKPHOS 63 10/28/2015 0946   AST 12* 10/28/2015 0946   ALT 9* 10/28/2015 0946   BILITOT 0.5 10/28/2015 0946     IMPRESSION: 1. Multiple bilateral pulmonary nodules suspicious for metastatic disease. None of these lesions are big enough to biopsy or PET. Recommend correlation with recent lymph node biopsy. 2. No mediastinal or hilar mass or lymphadenopathy. 3. Mild emphysematous changes and pulmonary scarring.   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 However patient does not have menorrhagia.   # patient awaiting an endometrial biopsy next week.also awaiting and pelvic MRI and PET scan.  # I long discussion the patient and family- regarding starting chemotherapy with carboplatin and Taxol every 3 weeks. Patient will likely get a scan after 3 cycles to reevaluate the status of the disease. If she has a great response then possible surgery; if not she'll get 3 more cycles of chemotherapy. Also discussed that if the subcentimeter lung nodules are malignant- that this is stage IV malignancy.   # Discussed the potential side effects including but not limited to-increasing fatigue, nausea vomiting, diarrhea, hair loss, sores in the mouth, increase risk of infection and also neuropathy.   # Anemia- unlikely iron deficiency; more likely anemia of chronic disease.   # Plan to start chemotherapy next Friday/march 10th/ visit with me on the same day.   25 minutes face-to-face with the patient discussing the above plan of care; more than 50% of time spent on prognosis/ natural history; counseling and coordination.        Cammie Sickle, MD 11/06/2015 4:56 PM

## 2015-11-07 ENCOUNTER — Encounter: Payer: Self-pay | Admitting: *Deleted

## 2015-11-07 ENCOUNTER — Encounter: Payer: Self-pay | Admitting: Internal Medicine

## 2015-11-07 ENCOUNTER — Ambulatory Visit: Payer: Medicare Other | Admitting: Internal Medicine

## 2015-11-07 ENCOUNTER — Inpatient Hospital Stay: Payer: Medicare Other

## 2015-11-07 NOTE — Patient Instructions (Signed)

## 2015-11-08 ENCOUNTER — Ambulatory Visit
Admission: RE | Admit: 2015-11-08 | Discharge: 2015-11-08 | Disposition: A | Discharge status: Home / Self Care | Payer: Medicare Other | Source: Ambulatory Visit | Attending: Obstetrics and Gynecology | Admitting: Obstetrics and Gynecology

## 2015-11-08 DIAGNOSIS — R591 Generalized enlarged lymph nodes: Secondary | ICD-10-CM

## 2015-11-08 DIAGNOSIS — R599 Enlarged lymph nodes, unspecified: Secondary | ICD-10-CM

## 2015-11-08 DIAGNOSIS — N852 Hypertrophy of uterus: Secondary | ICD-10-CM

## 2015-11-08 NOTE — Telephone Encounter (Signed)
Megace was approved by insurance.

## 2015-11-12 ENCOUNTER — Ambulatory Visit
Admission: RE | Admit: 2015-11-12 | Discharge: 2015-11-12 | Disposition: A | Discharge status: Home / Self Care | Payer: Medicare Other | Source: Ambulatory Visit | Attending: Obstetrics and Gynecology | Admitting: Obstetrics and Gynecology

## 2015-11-12 ENCOUNTER — Encounter
Admission: RE | Admit: 2015-11-12 | Discharge: 2015-11-12 | Disposition: A | Discharge status: Home / Self Care | Payer: Medicare Other | Source: Ambulatory Visit | Attending: Obstetrics and Gynecology | Admitting: Obstetrics and Gynecology

## 2015-11-12 DIAGNOSIS — N852 Hypertrophy of uterus: Secondary | ICD-10-CM | POA: Diagnosis present

## 2015-11-12 DIAGNOSIS — N329 Bladder disorder, unspecified: Secondary | ICD-10-CM | POA: Diagnosis not present

## 2015-11-12 DIAGNOSIS — M899 Disorder of bone, unspecified: Secondary | ICD-10-CM | POA: Insufficient documentation

## 2015-11-12 DIAGNOSIS — N83312 Acquired atrophy of left ovary: Secondary | ICD-10-CM | POA: Diagnosis not present

## 2015-11-12 DIAGNOSIS — N83311 Acquired atrophy of right ovary: Secondary | ICD-10-CM | POA: Insufficient documentation

## 2015-11-12 DIAGNOSIS — R599 Enlarged lymph nodes, unspecified: Secondary | ICD-10-CM

## 2015-11-12 DIAGNOSIS — D259 Leiomyoma of uterus, unspecified: Secondary | ICD-10-CM | POA: Diagnosis not present

## 2015-11-12 DIAGNOSIS — R590 Localized enlarged lymph nodes: Secondary | ICD-10-CM | POA: Insufficient documentation

## 2015-11-12 DIAGNOSIS — R591 Generalized enlarged lymph nodes: Secondary | ICD-10-CM | POA: Insufficient documentation

## 2015-11-12 LAB — GLUCOSE, CAPILLARY: GLUCOSE-CAPILLARY: 86 mg/dL (ref 65–99)

## 2015-11-12 MED ORDER — FLUDEOXYGLUCOSE F - 18 (FDG) INJECTION
13.1400 | Freq: Once | INTRAVENOUS | Status: AC | PRN
  Administered 2015-11-12: 13.14 via INTRAVENOUS

## 2015-11-12 MED ORDER — GADOBENATE DIMEGLUMINE 529 MG/ML IV SOLN
15.0000 mL | Freq: Once | INTRAVENOUS | Status: AC | PRN
  Administered 2015-11-12: 14 mL via INTRAVENOUS

## 2015-11-13 ENCOUNTER — Inpatient Hospital Stay (HOSPITAL_BASED_OUTPATIENT_CLINIC_OR_DEPARTMENT_OTHER): Payer: Medicare Other | Admitting: Obstetrics and Gynecology

## 2015-11-13 VITALS — BP 126/82 | HR 85 | Temp 98.4°F | Ht 66.0 in | Wt 151.0 lb

## 2015-11-13 DIAGNOSIS — C778 Secondary and unspecified malignant neoplasm of lymph nodes of multiple regions: Secondary | ICD-10-CM

## 2015-11-13 DIAGNOSIS — C801 Malignant (primary) neoplasm, unspecified: Secondary | ICD-10-CM | POA: Diagnosis not present

## 2015-11-13 DIAGNOSIS — C786 Secondary malignant neoplasm of retroperitoneum and peritoneum: Secondary | ICD-10-CM | POA: Diagnosis not present

## 2015-11-13 DIAGNOSIS — C541 Malignant neoplasm of endometrium: Secondary | ICD-10-CM

## 2015-11-13 DIAGNOSIS — Z5111 Encounter for antineoplastic chemotherapy: Secondary | ICD-10-CM | POA: Diagnosis not present

## 2015-11-13 NOTE — Progress Notes (Addendum)
Gynecologic Oncology Consult Visit   Referring Provider: Cammie Sickle, MD  Chief Concern: Evaluation for gynecologic malignancy  Subjective:  Jennifer Berry is a 70 y.o. female who is seen in consultation from Dr. 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   Interval history: PAP done by Dr Theora Gianotti 11/06/15 showed adenocarcinoma and HR-HPV was positive.  Patient is to start carbo/taxol chemo on Friday and returns today for endometrial/cervical bx,  She feels weak and tired and has been losing weight. No vaginal bleeding, but some mild discharge.    Oncology History: PET/CT 11/12/15 IMPRESSION: 1. There is headache intensely hypermetabolic mass associated with the endometrium and cervix. Additional foci of increased uptake are associated with both sides of the uterus. Finding is concerning for primary gynecologic malignancy such as endometrial adenocarcinoma. 2. Evidence of hypermetabolic upper abdominal and pelvic lymph node metastasis. 3. Hypermetabolic lytic bone metastasis is involves the L1 vertebra. 4. Small pulmonary nodules are too small to reliably characterize. Cannot rule out metastatic disease.  CT A/P scan 10/25/2015 Reproductive: Uterus is very nodular within a unusual configuration. There are heterogeneous, low-density and calcified structures within uterus which probably represent fibroids. The adnexal tissue appears to be adjacent to the uterus but difficult to differentiate from the uterus. Cannot exclude enlargement of the left ovary or left adnexa measuring up to 5.0 cm on sequence 2, image 59.   IMPRESSION: Retroperitoneal lymphadenopathy in the abdomen and pelvis. There is also bilateral inguinal lymphadenopathy. Findings are concerning for a neoplastic process. Uterus is very nodular with probable fibroid disease but difficult to  differentiate ovarian tissue from the uterus and cannot exclude a neoplastic process involving the uterus or adnexa. The left inguinal lymph node is the most accessible target for tissue sampling.  1.6 cm nodular structure along the medial aspect of the right colon. This is nonspecific and could represent a diverticulum. Recommend attention to this area on follow up imaging versus screening colonoscopy.  Small nodular densities along the left lung base. These are nonspecific and the differential diagnosis may change depending on the etiology for the lymphadenopathy.  Gallstones.  Hepatic and renal cysts.  PATHOLOGY 10/29/2015 DIAGNOSIS:  A. LYMPH NODE, LEFT INGUINAL; CORE BIOPSY:  - METASTATIC HIGH GRADE SEROUS CARCINOMA, FAVOR ENDOMETRIAL ORIGIN (SEE  COMMENT).   Comment:  Immunohistochemical stains were performed to evaluate tumor site of  origin.  Positive: CK7, p53, p16  Negative: WT-1, TTF-1, CDX-2, CK20, ER, PR  The histologic features and staining pattern support the diagnosis. The  control slides stained appropriately. Results were called to Dr.  Rogue Bussing at 2 pm on 11/05/15.    CT chest scan 10/29/2015 IMPRESSION: 1. Multiple bilateral pulmonary nodules suspicious for metastatic disease. None of these lesions are big enough to biopsy or PET. Recommend correlation with recent lymph node biopsy. 2. No mediastinal or hilar mass or lymphadenopathy. 3. Mild emphysematous changes and pulmonary scarring.  She denies vaginal bleeding or pelvic pain. She has not had a Pap smear in years, but prior to age 59 she was compliant with annual exams. She does not have a h/o abnormal Pap smears. She does not recall being told she had leiomyoma in the past.    CA125  10/28/15 = 37.9 CEA 10/28/15 = 1.2  Problem List: Patient Active Problem List   Diagnosis Date Noted  . Endometrial cancer ([place]) 11/06/2015  . Retroperitoneal lymphadenopathy 10/28/2015    Past  Medical  History: Past Medical History  Diagnosis Date  . Hypertension   . Carpal tunnel syndrome   . Lymphadenopathy 10/25/15  . Decreased appetite   . Weight loss     30 lb weight loss since October 2016  . Anxiety   . Depression   . Nausea & vomiting   . Endometrial cancer ([place]) 11/06/2015    Past Surgical History: Past Surgical History  Procedure Laterality Date  . Carpal tunnel release Left   . Skin cancer excision    . Flexible sigmoidoscopy  05/2015    performed by Dr. Petra Kuba,    Past Gynecologic History:  As per HPI  OB History:  OB History  Gravida Para Term Preterm AB SAB TAB Ectopic Multiple Living  1 1            # Outcome Date GA Lbr Len/2nd Weight Sex Delivery Anes PTL Lv  1 Para               Family History: Family History  Problem Relation Age of Onset  . Breast cancer Sister   . Hypertension Sister   . Hypertension Brother   . Ovarian cancer Paternal Aunt   . Diabetes Maternal Grandmother   . Breast cancer Daughter   . Breast cancer Cousin     maternal    Social History: Social History   Social History  . Marital Status: Divorced    Spouse Name: N/A  . Number of Children: N/A  . Years of Education: N/A   Occupational History  . Not on file.   Social History Main Topics  . Smoking status: Never Smoker   . Smokeless tobacco: Never Used  . Alcohol Use: No  . Drug Use: No  . Sexual Activity: No   Other Topics Concern  . Not on file   Social History Narrative    Allergies: Allergies  Allergen Reactions  . Ibuprofen Swelling  . Tape Rash    Current Medications: Current Outpatient Prescriptions  Medication Sig Dispense Refill  . amLODipine (NORVASC) 10 MG tablet Take 1 tablet by mouth daily.    Marland Kitchen aspirin EC 81 MG tablet Take 81 mg by mouth daily.    . baclofen (LIORESAL) 10 MG tablet Take by mouth.    . hydrochlorothiazide (HYDRODIURIL) 25 MG tablet Take 1 tablet by mouth daily.    Marland Kitchen losartan (COZAAR) 100 MG tablet Take  1 tablet by mouth daily.    . megestrol (MEGACE ES) 625 MG/5ML suspension Take 5 mLs (625 mg total) by mouth daily. 150 mL 0  . ondansetron (ZOFRAN) 4 MG tablet Take by mouth. Reported on 10/28/2015    . promethazine (PHENERGAN) 25 MG tablet Take 1 tablet (25 mg total) by mouth every 6 (six) hours as needed for nausea or vomiting. 20 tablet 0   No current facility-administered medications for this visit.    Review of Systems General: fatigue, changes in weight or appetite with weight loss and weakness Skin: negative Eyes: negative HEENT: negative for, change in hearing, tinnitus, voice changes Breasts: negative Pulmonary: dyspnea Cardiac: negative for, palpitations, pain Gastrointestinal: positive for nausea, negative for, vomiting, constipation, diarrhea, hematemesis, hematochezia Genitourinary/Sexual: negative for, dysuria, hesitancy, retention Ob/Gyn: negative for, irregular bleeding, pain Musculoskeletal: negative for, pain, stiffness, swelling, range of motion limitation Hematology: negative for, easy bruising, bleeding Neurologic/Psych: negative for, headaches, seizures, paralysis  Objective:  Physical Examination:  BP 126/82 mmHg  Pulse 85  Temp(Src) 98.4 F (36.9 C) (Oral)  Ht 5' 6"  (1.676 m)  Wt 151 lb 0.2 oz (68.5 kg)  BMI 24.39 kg/m2   ECOG Performance Status: 0 - Asymptomatic  General appearance: alert, cooperative and appears stated age HEENT:PERRLA, extra ocular movement intact and sclera clear, anicteric Lymph node survey: non-palpable [place] and axillary, palpable enlarged node size 2-3 cm left inguinal and shotty right inguinal Cardiovascular: regular rate and rhythm Respiratory: normal air entry, lungs clear to auscultation Abdomen: soft, non-tender, without masses or organomegaly, no hernias and no ascites Back: inspection of back is normal Extremities: extremities normal, atraumatic, no cyanosis or edema Skin exam - normal coloration and turgor, no rashes, no  suspicious skin lesions noted. Neurological exam reveals alert, oriented, normal speech, no focal findings or movement disorder noted.  Pelvic: exam chaperoned by nurse;  Vulva: normal appearing vulva with no masses, tenderness or lesions; Vagina: normal vagina; Adnexa: unable to determine due to uterine size; Uterus: enlarged to at least 12 week size but could not determine due to patient tenderness. Bulky and firm with limited mobility; Cervix: no lesions but firm on exam Pap obtained; Rectal: normal rectal, no masses  Procedure note:  Patient signed informed consent for uterine biopsy.  Tenaculum was placed on cervix, but unable to enter the uterine cavity with the pippelle.  Endocervical biopsy done and there was significant bleeding.  This stopped spontaneously.    Radiologic Imaging: Reviewed    Assessment:  Jennifer Berry is a 70 y.o. 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 IHC staining of tumor from the inguinal node is compatible with a serous uterine cancer but would not expect positive p16 staining. The positive p16 staining more likely reflects cervical etiology and PAP from last week shows adenocarcinoma, HR HPV is also positive, and CA125 is not dramatically elevated (37), supporting a diagnosis of cervical cancer.    Medical co-morbidities complicating care: HTN.   Plan:   Problem List Items Addressed This Visit      Genitourinary   Endometrial cancer ([place]) - Primary     Cervix looks normal but feels firm and endocervical biopsy was done today and caused significant bleeding.  I was unable to enter the uterine cavity to do an endometrial biopsy.  It is likely that she has a cervical primary in view of the positive PAP, +HPV and +p16.  However, recent PET scan shows involvement of the uterus and cervix as well as pelvic and abdominal adenopathy and small indeterminate lung lesions and L1 metastasis.  Extensive nodal involvement and lack  of highly elevated CA125 consistent with cervical (or endometrial) primary.  Dr. Rogue Bussing is going to start paclitaxel/carboplatin later this week.  Regardless of whether this cancer is arising in the cervix or uterus it would ber reasonable to consider TLH, BSO after completing chemotherapy if she has a good response.  The patient's diagnosis, an outline of the further diagnostic and laboratory studies which will be required, the recommendation, and alternatives were discussed.  All questions were answered to the patient's satisfaction.  Mellody Drown, MD  CC:  Cammie Sickle, MD

## 2015-11-13 NOTE — Progress Notes (Signed)
  Oncology Nurse Navigator Documentation  Navigator Location: [facility] (11/13/15 1500) Navigator Encounter Type: Clinic/MDC (11/13/15 1500)           Patient Visit Type:  (Biopsy) (11/13/15 1500)                    Acuity: Level 2 (11/13/15 1500)         Time Spent with Patient: 45 (11/13/15 1500)   Chaperoned pelvic exam and biopsy.

## 2015-11-14 ENCOUNTER — Ambulatory Visit: Payer: Medicare Other | Admitting: Internal Medicine

## 2015-11-14 ENCOUNTER — Other Ambulatory Visit: Payer: Self-pay | Admitting: Internal Medicine

## 2015-11-15 ENCOUNTER — Inpatient Hospital Stay: Payer: Medicare Other

## 2015-11-15 ENCOUNTER — Inpatient Hospital Stay (HOSPITAL_BASED_OUTPATIENT_CLINIC_OR_DEPARTMENT_OTHER): Payer: Medicare Other | Admitting: Internal Medicine

## 2015-11-15 VITALS — BP 134/74 | HR 86 | Resp 20

## 2015-11-15 VITALS — BP 139/76 | HR 83 | Temp 97.0°F | Resp 18 | Wt 150.4 lb

## 2015-11-15 DIAGNOSIS — R590 Localized enlarged lymph nodes: Secondary | ICD-10-CM

## 2015-11-15 DIAGNOSIS — C541 Malignant neoplasm of endometrium: Secondary | ICD-10-CM

## 2015-11-15 DIAGNOSIS — C801 Malignant (primary) neoplasm, unspecified: Secondary | ICD-10-CM | POA: Diagnosis not present

## 2015-11-15 DIAGNOSIS — C786 Secondary malignant neoplasm of retroperitoneum and peritoneum: Secondary | ICD-10-CM

## 2015-11-15 DIAGNOSIS — Z5111 Encounter for antineoplastic chemotherapy: Secondary | ICD-10-CM | POA: Diagnosis not present

## 2015-11-15 DIAGNOSIS — Z8041 Family history of malignant neoplasm of ovary: Secondary | ICD-10-CM

## 2015-11-15 DIAGNOSIS — R634 Abnormal weight loss: Secondary | ICD-10-CM

## 2015-11-15 DIAGNOSIS — R918 Other nonspecific abnormal finding of lung field: Secondary | ICD-10-CM

## 2015-11-15 DIAGNOSIS — Z803 Family history of malignant neoplasm of breast: Secondary | ICD-10-CM

## 2015-11-15 DIAGNOSIS — G5600 Carpal tunnel syndrome, unspecified upper limb: Secondary | ICD-10-CM

## 2015-11-15 DIAGNOSIS — F418 Other specified anxiety disorders: Secondary | ICD-10-CM

## 2015-11-15 DIAGNOSIS — R630 Anorexia: Secondary | ICD-10-CM | POA: Diagnosis not present

## 2015-11-15 DIAGNOSIS — C778 Secondary and unspecified malignant neoplasm of lymph nodes of multiple regions: Secondary | ICD-10-CM | POA: Diagnosis not present

## 2015-11-15 DIAGNOSIS — R112 Nausea with vomiting, unspecified: Secondary | ICD-10-CM

## 2015-11-15 DIAGNOSIS — C189 Malignant neoplasm of colon, unspecified: Secondary | ICD-10-CM

## 2015-11-15 DIAGNOSIS — R1900 Intra-abdominal and pelvic swelling, mass and lump, unspecified site: Secondary | ICD-10-CM

## 2015-11-15 DIAGNOSIS — I10 Essential (primary) hypertension: Secondary | ICD-10-CM

## 2015-11-15 DIAGNOSIS — Z7982 Long term (current) use of aspirin: Secondary | ICD-10-CM

## 2015-11-15 DIAGNOSIS — Z79899 Other long term (current) drug therapy: Secondary | ICD-10-CM

## 2015-11-15 DIAGNOSIS — D6489 Other specified anemias: Secondary | ICD-10-CM

## 2015-11-15 DIAGNOSIS — Z85828 Personal history of other malignant neoplasm of skin: Secondary | ICD-10-CM

## 2015-11-15 DIAGNOSIS — R591 Generalized enlarged lymph nodes: Secondary | ICD-10-CM

## 2015-11-15 LAB — COMPREHENSIVE METABOLIC PANEL
ALT: 11 U/L — AB (ref 14–54)
AST: 15 U/L (ref 15–41)
Albumin: 3 g/dL — ABNORMAL LOW (ref 3.5–5.0)
Alkaline Phosphatase: 75 U/L (ref 38–126)
Anion gap: 7 (ref 5–15)
BUN: 13 mg/dL (ref 6–20)
CHLORIDE: 96 mmol/L — AB (ref 101–111)
CO2: 27 mmol/L (ref 22–32)
CREATININE: 0.89 mg/dL (ref 0.44–1.00)
Calcium: 9.2 mg/dL (ref 8.9–10.3)
Glucose, Bld: 135 mg/dL — ABNORMAL HIGH (ref 65–99)
Potassium: 3.7 mmol/L (ref 3.5–5.1)
Sodium: 130 mmol/L — ABNORMAL LOW (ref 135–145)
TOTAL PROTEIN: 8.3 g/dL — AB (ref 6.5–8.1)
Total Bilirubin: 0.5 mg/dL (ref 0.3–1.2)

## 2015-11-15 LAB — CBC WITH DIFFERENTIAL/PLATELET
BASOS PCT: 1 %
Basophils Absolute: 0 10*3/uL (ref 0–0.1)
Eosinophils Absolute: 0 10*3/uL (ref 0–0.7)
Eosinophils Relative: 0 %
HCT: 31.2 % — ABNORMAL LOW (ref 35.0–47.0)
Hemoglobin: 10.5 g/dL — ABNORMAL LOW (ref 12.0–16.0)
LYMPHS ABS: 0.7 10*3/uL — AB (ref 1.0–3.6)
Lymphocytes Relative: 11 %
MCH: 25.7 pg — AB (ref 26.0–34.0)
MCHC: 33.5 g/dL (ref 32.0–36.0)
MCV: 76.7 fL — AB (ref 80.0–100.0)
MONO ABS: 0.5 10*3/uL (ref 0.2–0.9)
MONOS PCT: 8 %
NEUTROS ABS: 5.1 10*3/uL (ref 1.4–6.5)
Neutrophils Relative %: 80 %
PLATELETS: 385 10*3/uL (ref 150–440)
RBC: 4.07 MIL/uL (ref 3.80–5.20)
RDW: 15.4 % — AB (ref 11.5–14.5)
WBC: 6.3 10*3/uL (ref 3.6–11.0)

## 2015-11-15 MED ORDER — PEGFILGRASTIM 6 MG/0.6ML ~~LOC~~ PSKT
6.0000 mg | PREFILLED_SYRINGE | Freq: Once | SUBCUTANEOUS | Status: AC
  Administered 2015-11-15: 6 mg via SUBCUTANEOUS
  Filled 2015-11-15: qty 0.6

## 2015-11-15 MED ORDER — HEPARIN SOD (PORK) LOCK FLUSH 100 UNIT/ML IV SOLN: 500.0000 [IU] | Freq: Once | INTRAVENOUS | Status: DC | PRN

## 2015-11-15 MED ORDER — PROCHLORPERAZINE MALEATE 10 MG PO TABS: 10.0000 mg | ORAL_TABLET | Freq: Four times a day (QID) | ORAL | Status: AC | PRN

## 2015-11-15 MED ORDER — PACLITAXEL CHEMO INJECTION 300 MG/50ML
175.0000 mg/m2 | Freq: Once | INTRAVENOUS | Status: AC
  Administered 2015-11-15: 312 mg via INTRAVENOUS
  Filled 2015-11-15: qty 52

## 2015-11-15 MED ORDER — DEXAMETHASONE SODIUM PHOSPHATE 100 MG/10ML IJ SOLN
20.0000 mg | Freq: Once | INTRAMUSCULAR | Status: AC
  Administered 2015-11-15: 20 mg via INTRAVENOUS
  Filled 2015-11-15: qty 2

## 2015-11-15 MED ORDER — PACLITAXEL CHEMO INJECTION 300 MG/50ML
175.0000 mg/m2 | Freq: Once | INTRAVENOUS | Status: DC
  Filled 2015-11-15: qty 52

## 2015-11-15 MED ORDER — PALONOSETRON HCL INJECTION 0.25 MG/5ML
0.2500 mg | Freq: Once | INTRAVENOUS | Status: AC
  Administered 2015-11-15: 0.25 mg via INTRAVENOUS
  Filled 2015-11-15: qty 5

## 2015-11-15 MED ORDER — SODIUM CHLORIDE 0.9 % IV SOLN
Freq: Once | INTRAVENOUS | Status: AC
  Administered 2015-11-15: 10:00:00 via INTRAVENOUS
  Filled 2015-11-15: qty 1000

## 2015-11-15 MED ORDER — DIPHENHYDRAMINE HCL 50 MG/ML IJ SOLN
50.0000 mg | Freq: Once | INTRAMUSCULAR | Status: AC
  Administered 2015-11-15: 50 mg via INTRAVENOUS
  Filled 2015-11-15: qty 1

## 2015-11-15 MED ORDER — SODIUM CHLORIDE 0.9 % IV SOLN
403.5000 mg | Freq: Once | INTRAVENOUS | Status: AC
  Administered 2015-11-15: 400 mg via INTRAVENOUS
  Filled 2015-11-15: qty 40

## 2015-11-15 MED ORDER — ONDANSETRON HCL 8 MG PO TABS: ORAL_TABLET | ORAL | Status: DC

## 2015-11-15 MED ORDER — FAMOTIDINE IN NACL 20-0.9 MG/50ML-% IV SOLN
20.0000 mg | Freq: Once | INTRAVENOUS | Status: AC
  Administered 2015-11-15: 20 mg via INTRAVENOUS
  Filled 2015-11-15: qty 50

## 2015-11-15 NOTE — Progress Notes (Signed)
[facility] OFF[facility] PROGRESS NOTE  Patient Care Team: Petra Kuba, MD as PCP - General (Family Medicine) Clent Jacks, RN as Registered Nurse   SUMMARY OF ONCOLOGIC HISTORY:  # FEB 2017- STAGE IV ENDOMETRIAL ADENO CA [s/p Inguinal LN Bx; s/p endocervical Bx- pos; p16 pos? endocervical] Lymphadenopathy-RP/Ingiunal LN; CT-PET  bil pul nodules [sub-cm]/ MRI- pelvis; March 10th- START Carbo-taxol  # L-1spinous process lytic lesion [on PET]  INTERVAL HISTORY:  70 year old female patient with with above history of metastatic likely endometrial cancer is here to proceed with chemotherapy. Patient interim has been evaluated by gynecology oncology- had endocervical biopsy with bleeding.   She continues to have poor appetite. Nausea/ intermittent vomiting. Denies any dysphagia. Denies any blood in stools/menorrhagia. She feels weak all over. Denies any pain.   REVIEW OF SYSTEMS:  A complete 10 point review of system is done which is negative except mentioned above/history of present illness.   PAST MEDICAL HISTORY :  Past Medical History  Diagnosis Date  . Hypertension   . Carpal tunnel syndrome   . Lymphadenopathy 10/25/15  . Decreased appetite   . Weight loss     30 lb weight loss since October 2016  . Anxiety   . Depression   . Nausea & vomiting   . Endometrial cancer ([place]) 11/06/2015    PAST SURGICAL HISTORY :   Past Surgical History  Procedure Laterality Date  . Carpal tunnel release Left   . Skin cancer excision    . Flexible sigmoidoscopy  05/2015    performed by Dr. Petra Kuba,    FAMILY HISTORY :   Family History  Problem Relation Age of Onset  . Breast cancer Sister   . Hypertension Sister   . Hypertension Brother   . Ovarian cancer Paternal Aunt   . Diabetes Maternal Grandmother   . Breast cancer Daughter   . Breast cancer Cousin     maternal    SOCIAL HISTORY:   Social History  Substance Use Topics  . Smoking status: Never  Smoker   . Smokeless tobacco: Never Used  . Alcohol Use: No    ALLERGIES:  is allergic to ibuprofen and tape.  MEDICATIONS:  Current Outpatient Prescriptions  Medication Sig Dispense Refill  . amLODipine (NORVASC) 10 MG tablet Take 1 tablet by mouth daily.    Marland Kitchen aspirin EC 81 MG tablet Take 81 mg by mouth daily.    . baclofen (LIORESAL) 10 MG tablet Take by mouth.    . hydrochlorothiazide (HYDRODIURIL) 25 MG tablet Take 1 tablet by mouth daily.    Marland Kitchen losartan (COZAAR) 100 MG tablet Take 1 tablet by mouth daily.    . megestrol (MEGACE ES) 625 MG/5ML suspension Take 5 mLs (625 mg total) by mouth daily. 150 mL 0  . ondansetron (ZOFRAN) 8 MG tablet Start 3 days after chemo 40 tablet 0  . promethazine (PHENERGAN) 25 MG tablet Take 1 tablet (25 mg total) by mouth every 6 (six) hours as needed for nausea or vomiting. 20 tablet 0  . prochlorperazine (COMPAZINE) 10 MG tablet Take 1 tablet (10 mg total) by mouth every 6 (six) hours as needed for nausea or vomiting. 40 tablet 0   No current facility-administered medications for this visit.    PHYSICAL EXAMINATION: ECOG PERFORMANCE STATUS: 1 - Symptomatic but completely ambulatory  BP 139/76 mmHg  Pulse 83  Temp(Src) 97 F (36.1 C) (Tympanic)  Resp 18  Wt 150 lb 5.7 oz (68.2 kg)  Filed Weights   11/15/15 0849  Weight: 150 lb 5.7 oz (68.2 kg)   GENERAL: Well-nourished well-developed; Alert, no distress and Mildly comfortable given her nausea. She is accompanied by her husband. EYES: no pallor or icterus OROPHARYNX: no thrush or ulceration; good dentition  NECK: supple, no masses felt LYMPH: no palpable lymphadenopathy in the cervical ; 1 cm left axillary lymph node felt; about 1 cm left inguinal lymph node felt. LUNGS: clear to auscultation and No wheeze or crackles HEART/CVS: regular rate & rhythm and no murmurs; No lower extremity edema ABDOMEN: abdomen soft, non-tender and normal bowel sounds Musculoskeletal:no cyanosis of digits  and no clubbing  PSYCH: alert & oriented x 3 with fluent speech NEURO: no focal motor/sensory deficits SKIN: no rashes or significant lesions LABORATORY DATA:  I have reviewed the data as listed    Component Value Date/Time   NA 130* 11/15/2015 0808   K 3.7 11/15/2015 0808   CL 96* 11/15/2015 0808   CO2 27 11/15/2015 0808   GLUCOSE 135* 11/15/2015 0808   BUN 13 11/15/2015 0808   CREATININE 0.89 11/15/2015 0808   CALCIUM 9.2 11/15/2015 0808   PROT 8.3* 11/15/2015 0808   ALBUMIN 3.0* 11/15/2015 0808   AST 15 11/15/2015 0808   ALT 11* 11/15/2015 0808   ALKPHOS 75 11/15/2015 0808   BILITOT 0.5 11/15/2015 0808   GFRNONAA >60 11/15/2015 0808   GFRAA >60 11/15/2015 0808    No results found for: SPEP, UPEP  Lab Results  Component Value Date   WBC 6.3 11/15/2015   NEUTROABS 5.1 11/15/2015   HGB 10.5* 11/15/2015   HCT 31.2* 11/15/2015   MCV 76.7* 11/15/2015   PLT 385 11/15/2015      Chemistry      Component Value Date/Time   NA 130* 11/15/2015 0808   K 3.7 11/15/2015 0808   CL 96* 11/15/2015 0808   CO2 27 11/15/2015 0808   BUN 13 11/15/2015 0808   CREATININE 0.89 11/15/2015 0808      Component Value Date/Time   CALCIUM 9.2 11/15/2015 0808   ALKPHOS 75 11/15/2015 0808   AST 15 11/15/2015 0808   ALT 11* 11/15/2015 0808   BILITOT 0.5 11/15/2015 0808     IMPRESSION: 1. Multiple bilateral pulmonary nodules suspicious for metastatic disease. None of these lesions are big enough to biopsy or PET. Recommend correlation with recent lymph node biopsy. 2. No mediastinal or hilar mass or lymphadenopathy. 3. Mild emphysematous changes and pulmonary scarring.   ASSESSMENT & PLAN:   # Likely endometrial adenocarcinoma [? Endocervical; given P 16]- stage IV/I reviewed the PET scan/MRI pelvis the patient in detail. Proceed with chemotherapy with carbotaxol today. Discussed that we will plan to have repeat imaging after 3 cycles; and then Surgery might be a possibility  based  upon response.   # Ongoing nausea- the etiology is unclear; recommend MRI of the brain to rule out any brain metastases. Discussed regarding Zofran and Compazine.   # L-1 spinous lesion on PET- monitor for now. Asymptomatic.   # Pt to f/u with md in 10 days with labs: cbc, met b  I spoke to Dr. Fransisca Connors. I reviewed the images myself and with the patient and husband.  25 minutes face-to-face with the patient discussing the above plan of care; more than 50% of time spent on prognosis/ natural history; counseling and coordination.        Cammie Sickle, MD 11/15/2015 9:25 AM

## 2015-11-15 NOTE — Progress Notes (Signed)
Patient states she is nauseated.  She usually throws up her pills when she tries to take them.

## 2015-11-18 ENCOUNTER — Encounter: Payer: Self-pay | Admitting: Internal Medicine

## 2015-11-18 ENCOUNTER — Other Ambulatory Visit: Payer: Self-pay | Admitting: Internal Medicine

## 2015-11-18 ENCOUNTER — Telehealth: Payer: Self-pay | Admitting: *Deleted

## 2015-11-18 DIAGNOSIS — C541 Malignant neoplasm of endometrium: Secondary | ICD-10-CM

## 2015-11-18 MED ORDER — HYDROCODONE-ACETAMINOPHEN 5-300 MG PO TABS: 1.0000 | ORAL_TABLET | Freq: Three times a day (TID) | ORAL | Status: DC | PRN

## 2015-11-18 NOTE — Telephone Encounter (Signed)
Printed a prescription for hydrocodone.

## 2015-11-18 NOTE — Telephone Encounter (Signed)
Spoke with MD. V/O to tell patient not to take any Nsaids to avoid renal risk factors. He would like to prescribe RX for hydrocodone instead. Md will write RX and asked the patient or caregiver pick up script tomorrow.  Call attempt made to speak to daughter. Phone just rings. Unable to leave vm.  I called and spoke with patient. She is agreeable to start hydrocodone. I advised pt not to take NSAIDS. Teach back process performed with pt.

## 2015-11-18 NOTE — Telephone Encounter (Signed)
RN received pt email from pt's daughter, who reported "leg pain." RN called daughter Lattie Haw to clarify patient's symptoms. Daughter reports that patient is "experiencing numbness and generalized leg aching, heaviness in legs and pressure. She's been complaining of being achy all over since she was given the neulasta injection." per daughter, "pt did not take any Claritin to prevent body aches after neulasta injection." per daughter "pt denies any edema in lower extremities. Pt is using a "heating pad and resting her legs."  I called the pt personally. She states that the "pain is located in my abdomen and pelvis. I have lots of pelvic pressure. This pressure radiates down my legs. My legs feel very heavy at time. I do not have any noticeable swelling in my legs. I haven't taken anything for pain. Normally, I take 1 Aleve to help with my arthritis but was afraid to take this. I do not like taking pain medication in general. What is the doctor's recommendation?"

## 2015-11-19 ENCOUNTER — Telehealth: Payer: Self-pay | Admitting: *Deleted

## 2015-11-19 NOTE — Telephone Encounter (Signed)
Norco 5/300 mg needs a PA, but the 5/325 does not. She has to order it and it should be in tomorrow (her supplier has 900 count) She will dispense #15 tabs of 5/300mg  which pt will pay out of pocket for, and wants [place] to mail a new Rx for 5/325 mg Norco to them for further use

## 2015-11-20 ENCOUNTER — Encounter: Payer: Self-pay | Admitting: *Deleted

## 2015-11-20 ENCOUNTER — Ambulatory Visit
Admission: RE | Admit: 2015-11-20 | Discharge: 2015-11-20 | Disposition: A | Discharge status: Home / Self Care | Payer: Medicare Other | Source: Ambulatory Visit | Attending: Internal Medicine | Admitting: Internal Medicine

## 2015-11-20 DIAGNOSIS — C7931 Secondary malignant neoplasm of brain: Secondary | ICD-10-CM | POA: Diagnosis present

## 2015-11-20 DIAGNOSIS — C541 Malignant neoplasm of endometrium: Secondary | ICD-10-CM

## 2015-11-20 DIAGNOSIS — I679 Cerebrovascular disease, unspecified: Secondary | ICD-10-CM | POA: Diagnosis not present

## 2015-11-20 DIAGNOSIS — R110 Nausea: Secondary | ICD-10-CM | POA: Diagnosis present

## 2015-11-20 MED ORDER — GADOBENATE DIMEGLUMINE 529 MG/ML IV SOLN
15.0000 mL | Freq: Once | INTRAVENOUS | Status: AC | PRN
  Administered 2015-11-20: 14 mL via INTRAVENOUS

## 2015-11-20 MED ORDER — HYDROCODONE-ACETAMINOPHEN 5-325 MG PO TABS: 1.0000 | ORAL_TABLET | Freq: Four times a day (QID) | ORAL | Status: DC | PRN

## 2015-11-20 NOTE — Telephone Encounter (Signed)
Faxed and mailed new rx

## 2015-11-21 ENCOUNTER — Encounter: Payer: Self-pay | Admitting: Internal Medicine

## 2015-11-22 ENCOUNTER — Emergency Department
Admission: EM | Admit: 2015-11-22 | Discharge: 2015-11-22 | Disposition: A | Discharge status: Home / Self Care | Payer: Medicare Other | Attending: Emergency Medicine | Admitting: Emergency Medicine

## 2015-11-22 ENCOUNTER — Encounter: Payer: Self-pay | Admitting: Emergency Medicine

## 2015-11-22 DIAGNOSIS — Z79899 Other long term (current) drug therapy: Secondary | ICD-10-CM | POA: Diagnosis not present

## 2015-11-22 DIAGNOSIS — C541 Malignant neoplasm of endometrium: Secondary | ICD-10-CM | POA: Insufficient documentation

## 2015-11-22 DIAGNOSIS — I10 Essential (primary) hypertension: Secondary | ICD-10-CM | POA: Diagnosis not present

## 2015-11-22 DIAGNOSIS — R112 Nausea with vomiting, unspecified: Secondary | ICD-10-CM

## 2015-11-22 DIAGNOSIS — E860 Dehydration: Secondary | ICD-10-CM

## 2015-11-22 DIAGNOSIS — F329 Major depressive disorder, single episode, unspecified: Secondary | ICD-10-CM | POA: Insufficient documentation

## 2015-11-22 DIAGNOSIS — E871 Hypo-osmolality and hyponatremia: Secondary | ICD-10-CM

## 2015-11-22 LAB — COMPREHENSIVE METABOLIC PANEL
ALBUMIN: 3.2 g/dL — AB (ref 3.5–5.0)
ALT: 13 U/L — ABNORMAL LOW (ref 14–54)
ANION GAP: 10 (ref 5–15)
AST: 18 U/L (ref 15–41)
Alkaline Phosphatase: 76 U/L (ref 38–126)
BUN: 15 mg/dL (ref 6–20)
CHLORIDE: 91 mmol/L — AB (ref 101–111)
CO2: 25 mmol/L (ref 22–32)
Calcium: 9 mg/dL (ref 8.9–10.3)
Creatinine, Ser: 0.93 mg/dL (ref 0.44–1.00)
GFR calc Af Amer: >60 mL/min (ref >60)
GFR calc non Af Amer: >60 mL/min (ref >60)
GLUCOSE: 107 mg/dL — AB (ref 65–99)
POTASSIUM: 3.5 mmol/L (ref 3.5–5.1)
Sodium: 126 mmol/L — ABNORMAL LOW (ref 135–145)
TOTAL PROTEIN: 8.2 g/dL — AB (ref 6.5–8.1)
Total Bilirubin: 0.4 mg/dL (ref 0.3–1.2)

## 2015-11-22 LAB — CBC
HEMATOCRIT: 31.3 % — AB (ref 35.0–47.0)
HEMOGLOBIN: 10.3 g/dL — AB (ref 12.0–16.0)
MCH: 25.2 pg — ABNORMAL LOW (ref 26.0–34.0)
MCHC: 32.8 g/dL (ref 32.0–36.0)
MCV: 76.7 fL — AB (ref 80.0–100.0)
Platelets: 294 10*3/uL (ref 150–440)
RBC: 4.08 MIL/uL (ref 3.80–5.20)
RDW: 15.9 % — AB (ref 11.5–14.5)
WBC: 5.4 10*3/uL (ref 3.6–11.0)

## 2015-11-22 LAB — LIPASE, BLOOD: LIPASE: 18 U/L (ref 11–51)

## 2015-11-22 MED ORDER — ONDANSETRON HCL 4 MG/2ML IJ SOLN
INTRAMUSCULAR | Status: AC
  Filled 2015-11-22: qty 2

## 2015-11-22 MED ORDER — SODIUM CHLORIDE 0.9 % IV BOLUS (SEPSIS)
1000.0000 mL | Freq: Once | INTRAVENOUS | Status: AC
  Administered 2015-11-22: 1000 mL via INTRAVENOUS

## 2015-11-22 MED ORDER — ONDANSETRON HCL 4 MG/2ML IJ SOLN
4.0000 mg | Freq: Once | INTRAMUSCULAR | Status: AC | PRN
  Administered 2015-11-22: 4 mg via INTRAVENOUS

## 2015-11-22 MED ORDER — SODIUM CHLORIDE 0.9 % IV SOLN
1000.0000 mL | Freq: Once | INTRAVENOUS | Status: AC
  Administered 2015-11-22: 1000 mL via INTRAVENOUS

## 2015-11-22 MED ORDER — ONDANSETRON HCL 4 MG/2ML IJ SOLN
INTRAMUSCULAR | Status: AC
  Administered 2015-11-22: 4 mg via INTRAVENOUS
  Filled 2015-11-22: qty 2

## 2015-11-22 MED ORDER — ONDANSETRON 4 MG PO TBDP: 8.0000 mg | ORAL_TABLET | Freq: Three times a day (TID) | ORAL | Status: DC | PRN

## 2015-11-22 NOTE — ED Provider Notes (Signed)
[facility] Emergency Department Provider Note  ____________________________________________    I have reviewed the triage vital signs and the nursing notes.   HISTORY  Chief Complaint Nausea and Dehydration    HPI Jennifer Berry is a 70 y.o. female who presents with complaints of nausea and weakness. Patient reports she had chemotherapy one week ago. This was her first session and she is being treated for endometrial cancer at [place]. She denies fevers or chills. She denies abdominal pain. She just feels nauseous when she is exposed to food and hence has not been eating much or drinking much.     Past Medical History  Diagnosis Date  . Hypertension   . Carpal tunnel syndrome   . Lymphadenopathy 10/25/15  . Decreased appetite   . Weight loss     30 lb weight loss since October 2016  . Anxiety   . Depression   . Nausea & vomiting   . Endometrial cancer ([place]) 11/06/2015    Patient Active Problem List   Diagnosis Date Noted  . Endometrial cancer ([place]) 11/06/2015  . Retroperitoneal lymphadenopathy 10/28/2015    Past Surgical History  Procedure Laterality Date  . Carpal tunnel release Left   . Skin cancer excision    . Flexible sigmoidoscopy  05/2015    performed by Dr. Petra Kuba,    Current Outpatient Rx  Name  Route  Sig  Dispense  Refill  . amLODipine (NORVASC) 10 MG tablet   Oral   Take 1 tablet by mouth daily.         Marland Kitchen aspirin EC 81 MG tablet   Oral   Take 81 mg by mouth daily.         . baclofen (LIORESAL) 10 MG tablet   Oral   Take 10 mg by mouth as needed for muscle spasms.          . hydrochlorothiazide (HYDRODIURIL) 25 MG tablet   Oral   Take 1 tablet by mouth daily.         Marland Kitchen HYDROcodone-acetaminophen (NORCO/VICODIN) 5-325 MG tablet   Oral   Take 1 tablet by mouth every 6 (six) hours as needed for moderate pain.   40 tablet   0   . losartan (COZAAR) 100 MG tablet   Oral   Take 1 tablet  by mouth daily.         . ondansetron (ZOFRAN) 8 MG tablet      Start 3 days after chemo Patient taking differently: Take 8 mg by mouth as needed for nausea or vomiting. Start 3 days after chemo   40 tablet   0   . megestrol (MEGACE ES) 625 MG/5ML suspension   Oral   Take 5 mLs (625 mg total) by mouth daily. Patient not taking: Reported on 11/22/2015   150 mL   0   . prochlorperazine (COMPAZINE) 10 MG tablet   Oral   Take 1 tablet (10 mg total) by mouth every 6 (six) hours as needed for nausea or vomiting. Patient not taking: Reported on 11/22/2015   40 tablet   0   . promethazine (PHENERGAN) 25 MG tablet   Oral   Take 1 tablet (25 mg total) by mouth every 6 (six) hours as needed for nausea or vomiting. Patient not taking: Reported on 11/22/2015   20 tablet   0     Allergies Ibuprofen and Tape  Family History  Problem Relation Age of Onset  .  Breast cancer Sister   . Hypertension Sister   . Hypertension Brother   . Ovarian cancer Paternal Aunt   . Diabetes Maternal Grandmother   . Breast cancer Daughter   . Breast cancer Cousin     maternal    Social History Social History  Substance Use Topics  . Smoking status: Never Smoker   . Smokeless tobacco: Never Used  . Alcohol Use: No    Review of Systems  Constitutional: Negative for fever. Eyes: Negative for redness ENT: Negative for sore throat Cardiovascular: Negative for chest pain Respiratory: Negative for Cough Gastrointestinal: Negative for abdominal pain, Nausea as above Genitourinary: Negative for dysuria. Musculoskeletal: Negative for back pain. Skin: Negative for rash. Neurological: Negative for focal weakness Psychiatric: no anxiety    ____________________________________________   PHYSICAL EXAM:  VITAL SIGNS: ED Triage Vitals  Enc Vitals Group     BP 11/22/15 0855 153/84 mmHg     Pulse Rate 11/22/15 0855 93     Resp 11/22/15 0855 18     Temp 11/22/15 0855 97.6 F (36.4 C)      Temp Source 11/22/15 0855 Oral     SpO2 11/22/15 0855 100 %     Weight 11/22/15 0855 145 lb (65.772 kg)     Height 11/22/15 0855 5\' 7"  (1.702 m)     Head Cir --      Peak Flow --      Pain Score 11/22/15 1026 0     Pain Loc --      Pain Edu? --      Excl. in [place]? --     Constitutional: Alert and oriented. Well appearing and in no distress. Pleasant and interactive Eyes: Conjunctivae are normal. No erythema or injection ENT   Head: Normocephalic and atraumatic.   Mouth/Throat: Mucous membranes are moist. Cardiovascular: Normal rate, regular rhythm. Normal and symmetric distal pulses are present in the upper extremities. Respiratory: Normal respiratory effort without tachypnea nor retractions. Breath sounds are clear and equal bilaterally.  Gastrointestinal: Soft and non-tender in all quadrants. No distention. There is no CVA tenderness. Genitourinary: deferred Musculoskeletal: Nontender with normal range of motion in all extremities. No lower extremity tenderness nor edema. Neurologic:  Normal speech and language. No gross focal neurologic deficits are appreciated. Skin:  Skin is warm, dry and intact. No rash noted. Psychiatric: Mood and affect are normal. Patient exhibits appropriate insight and judgment.  ____________________________________________    LABS (pertinent positives/negatives)  Labs Reviewed  COMPREHENSIVE METABOLIC PANEL - Abnormal; Notable for the following:    Sodium 126 (*)    Chloride 91 (*)    Glucose, Bld 107 (*)    Total Protein 8.2 (*)    Albumin 3.2 (*)    ALT 13 (*)    All other components within normal limits  CBC - Abnormal; Notable for the following:    Hemoglobin 10.3 (*)    HCT 31.3 (*)    MCV 76.7 (*)    MCH 25.2 (*)    RDW 15.9 (*)    All other components within normal limits  LIPASE, BLOOD  URINALYSIS COMPLETEWITH MICROSCOPIC ([facility] ONLY)     ____________________________________________   EKG  None  ____________________________________________    RADIOLOGY  None  ____________________________________________   PROCEDURES  Procedure(s) performed: none  Critical Care performed: none  ____________________________________________   INITIAL IMPRESSION / ASSESSMENT AND PLAN / ED COURSE  Pertinent labs & imaging results that were available during my care of the patient were reviewed  by me and considered in my medical decision making (see chart for details).  Suspect patient is dehydrated. We'll give IV fluids and check labs. Patient's sodium is 126 which I attribute to dehydration, her sodium is not significantly different from one week ago.  Discussed the case with Dr. Grayland Ormond of oncology, he agrees with hydration  After fluids patient is feeling significantly better. I did offer admission but she would prefer to try to go home. I did write her for ODT Zofran as the act act of swallowing pills makes her nauseated  ____________________________________________   FINAL CLINICAL IMPRESSION(S) / ED DIAGNOSES  Final diagnoses:  Dehydration  Hyponatremia  Non-intractable vomiting with nausea, vomiting of unspecified type          Lavonia Drafts, MD 11/22/15 1525

## 2015-11-22 NOTE — ED Notes (Signed)
Cancer pt, last chemo tx was last week, pt has been unable to eat or drink for close to a week, states she feels weak. Denies pain, denies vomiting/diarrhea.

## 2015-11-22 NOTE — ED Notes (Addendum)
Pt to ED today with nausea and dry heaving that has been worse in the last week when trying to take water, meds, or food.  Pt states she feels very weak.  Pt states she is a cancer patient that has just started chemo treatments last Friday, 6 days ago.  Pt states she has been nausea prior to cancer diagnosis.

## 2015-11-22 NOTE — Discharge Instructions (Signed)

## 2015-11-25 ENCOUNTER — Inpatient Hospital Stay (HOSPITAL_BASED_OUTPATIENT_CLINIC_OR_DEPARTMENT_OTHER): Payer: Medicare Other | Admitting: Internal Medicine

## 2015-11-25 ENCOUNTER — Inpatient Hospital Stay: Payer: Medicare Other

## 2015-11-25 DIAGNOSIS — C541 Malignant neoplasm of endometrium: Secondary | ICD-10-CM

## 2015-11-25 DIAGNOSIS — C786 Secondary malignant neoplasm of retroperitoneum and peritoneum: Secondary | ICD-10-CM

## 2015-11-25 DIAGNOSIS — R634 Abnormal weight loss: Secondary | ICD-10-CM

## 2015-11-25 DIAGNOSIS — R21 Rash and other nonspecific skin eruption: Secondary | ICD-10-CM

## 2015-11-25 DIAGNOSIS — Z5111 Encounter for antineoplastic chemotherapy: Secondary | ICD-10-CM | POA: Diagnosis not present

## 2015-11-25 DIAGNOSIS — C778 Secondary and unspecified malignant neoplasm of lymph nodes of multiple regions: Secondary | ICD-10-CM

## 2015-11-25 DIAGNOSIS — R630 Anorexia: Secondary | ICD-10-CM

## 2015-11-25 DIAGNOSIS — C189 Malignant neoplasm of colon, unspecified: Secondary | ICD-10-CM

## 2015-11-25 DIAGNOSIS — D6489 Other specified anemias: Secondary | ICD-10-CM

## 2015-11-25 DIAGNOSIS — R1900 Intra-abdominal and pelvic swelling, mass and lump, unspecified site: Secondary | ICD-10-CM

## 2015-11-25 DIAGNOSIS — C801 Malignant (primary) neoplasm, unspecified: Secondary | ICD-10-CM | POA: Diagnosis not present

## 2015-11-25 DIAGNOSIS — R590 Localized enlarged lymph nodes: Secondary | ICD-10-CM

## 2015-11-25 DIAGNOSIS — Z79899 Other long term (current) drug therapy: Secondary | ICD-10-CM

## 2015-11-25 DIAGNOSIS — R591 Generalized enlarged lymph nodes: Secondary | ICD-10-CM

## 2015-11-25 DIAGNOSIS — R112 Nausea with vomiting, unspecified: Secondary | ICD-10-CM

## 2015-11-25 LAB — BASIC METABOLIC PANEL
ANION GAP: 9 (ref 5–15)
BUN: 8 mg/dL (ref 6–20)
CHLORIDE: 97 mmol/L — AB (ref 101–111)
CO2: 25 mmol/L (ref 22–32)
Calcium: 8.7 mg/dL — ABNORMAL LOW (ref 8.9–10.3)
Creatinine, Ser: 0.86 mg/dL (ref 0.44–1.00)
GFR calc non Af Amer: >60 mL/min (ref >60)
Glucose, Bld: 123 mg/dL — ABNORMAL HIGH (ref 65–99)
POTASSIUM: 3.2 mmol/L — AB (ref 3.5–5.1)
SODIUM: 131 mmol/L — AB (ref 135–145)

## 2015-11-25 LAB — CBC WITH DIFFERENTIAL/PLATELET
BASOS ABS: 0 10*3/uL (ref 0–0.1)
BASOS PCT: 0 %
EOS ABS: 0.1 10*3/uL (ref 0–0.7)
Eosinophils Relative: 0 %
HEMATOCRIT: 30.4 % — AB (ref 35.0–47.0)
HEMOGLOBIN: 9.8 g/dL — AB (ref 12.0–16.0)
Lymphocytes Relative: 6 %
Lymphs Abs: 0.9 10*3/uL — ABNORMAL LOW (ref 1.0–3.6)
MCH: 24.8 pg — ABNORMAL LOW (ref 26.0–34.0)
MCHC: 32.4 g/dL (ref 32.0–36.0)
MCV: 76.7 fL — ABNORMAL LOW (ref 80.0–100.0)
MONOS PCT: 4 %
Monocytes Absolute: 0.6 10*3/uL (ref 0.2–0.9)
NEUTROS ABS: 14.1 10*3/uL — AB (ref 1.4–6.5)
NEUTROS PCT: 90 %
Platelets: 271 10*3/uL (ref 150–440)
RBC: 3.96 MIL/uL (ref 3.80–5.20)
RDW: 16.1 % — ABNORMAL HIGH (ref 11.5–14.5)
WBC: 15.7 10*3/uL — AB (ref 3.6–11.0)

## 2015-11-25 MED ORDER — DRONABINOL 5 MG PO CAPS: 5.0000 mg | ORAL_CAPSULE | Freq: Two times a day (BID) | ORAL | Status: DC

## 2015-11-25 MED ORDER — PREDNISONE 20 MG PO TABS: 60.0000 mg | ORAL_TABLET | Freq: Every day | ORAL | Status: DC

## 2015-11-25 NOTE — Progress Notes (Signed)
[facility] OFFICE PROGRESS NOTE  Patient Care Team: Petra Kuba, MD as PCP - General (Family Medicine) Clent Jacks, RN as Registered Nurse   SUMMARY OF ONCOLOGIC HISTORY:  # FEB 2017- STAGE IV ENDOMETRIAL ADENO CA [s/p Inguinal LN Bx; s/p endocervical Bx- pos; p16 pos? endocervical] Lymphadenopathy-RP/Ingiunal LN; CT-PET  bil pul nodules [sub-cm]/ MRI- pelvis; March 10th- Carbo-taxol  # L-1spinous process lytic lesion [on PET]  INTERVAL HISTORY:  70 year old female patient with with above history of metastatic endometrial cancer currently on first an therapy with [place] Taxol except for follow-up. Patient had her first cycle approximately 10 days ago. She also had a few loose stools- for which she took Imodium currently improved.  Patient was recently seen in the emergency room for nausea and vomiting. She received some IV fluids in the emergency room. She has been taking round-the-clock antiemetics.  Today she feels improved.; However she noted to have a rash on her bilateral hands/extensor surface itching for the last 4 days. She denies a rash anywhere else. She denies any new medications.  She denies any pain. Denies any tingling and numbness. She took one pill of the hydrocodone which helped her right foot pain.  REVIEW OF SYSTEMS:  A complete 10 point review of system is done which is negative except mentioned above/history of present illness.   PAST MEDICAL HISTORY :  Past Medical History  Diagnosis Date  . Hypertension   . Carpal tunnel syndrome   . Lymphadenopathy 10/25/15  . Decreased appetite   . Weight loss     30 lb weight loss since October 2016  . Anxiety   . Depression   . Nausea & vomiting   . Endometrial cancer ([place]) 11/06/2015    PAST SURGICAL HISTORY :   Past Surgical History  Procedure Laterality Date  . Carpal tunnel release Left   . Skin cancer excision    . Flexible sigmoidoscopy  05/2015    performed by Dr. Petra Kuba,    FAMILY HISTORY :   Family History  Problem Relation Age of Onset  . Breast cancer Sister   . Hypertension Sister   . Hypertension Brother   . Ovarian cancer Paternal Aunt   . Diabetes Maternal Grandmother   . Breast cancer Daughter   . Breast cancer Cousin     maternal    SOCIAL HISTORY:   Social History  Substance Use Topics  . Smoking status: Never Smoker   . Smokeless tobacco: Never Used  . Alcohol Use: No    ALLERGIES:  is allergic to ibuprofen and tape.  MEDICATIONS:  Current Outpatient Prescriptions  Medication Sig Dispense Refill  . ondansetron (ZOFRAN) 8 MG tablet Start 3 days after chemo (Patient taking differently: Take 8 mg by mouth as needed for nausea or vomiting. Start 3 days after chemo) 40 tablet 0  . amLODipine (NORVASC) 10 MG tablet Take 1 tablet by mouth daily. Reported on 11/25/2015    . aspirin EC 81 MG tablet Take 81 mg by mouth daily. Reported on 11/25/2015    . baclofen (LIORESAL) 10 MG tablet Take 10 mg by mouth as needed for muscle spasms. Reported on 11/25/2015    . dronabinol (MARINOL) 5 MG capsule Take 1 capsule (5 mg total) by mouth 2 (two) times daily before a meal. 60 capsule 1  . hydrochlorothiazide (HYDRODIURIL) 25 MG tablet Take 1 tablet by mouth daily. Reported on 11/25/2015    . HYDROcodone-acetaminophen (NORCO/VICODIN) 5-325 MG  tablet Take 1 tablet by mouth every 6 (six) hours as needed for moderate pain. (Patient not taking: Reported on 11/25/2015) 40 tablet 0  . losartan (COZAAR) 100 MG tablet Take 1 tablet by mouth daily. Reported on 11/25/2015    . predniSONE (DELTASONE) 20 MG tablet Take 3 tablets (60 mg total) by mouth daily with breakfast. x3 days; 2 pills x3 days; 1 pill x 3 days 30 tablet 0  . prochlorperazine (COMPAZINE) 10 MG tablet Take 1 tablet (10 mg total) by mouth every 6 (six) hours as needed for nausea or vomiting. (Patient not taking: Reported on 11/25/2015) 40 tablet 0  . promethazine (PHENERGAN) 25 MG tablet Take  1 tablet (25 mg total) by mouth every 6 (six) hours as needed for nausea or vomiting. (Patient not taking: Reported on 11/25/2015) 20 tablet 0   No current facility-administered medications for this visit.    PHYSICAL EXAMINATION: ECOG PERFORMANCE STATUS: 1 - Symptomatic but completely ambulatory  There were no vitals taken for this visit.  There were no vitals filed for this visit. GENERAL: Well-nourished well-developed; Alert, no distress and Mildly comfortable given her nausea. She is accompanied by her husband. EYES: no pallor or icterus OROPHARYNX: no thrush or ulceration; good dentition  NECK: supple, no masses felt LYMPH: no palpable lymphadenopathy in the cervical ; 1 cm left axillary lymph node felt; about 1 cm left inguinal lymph node felt. LUNGS: clear to auscultation and No wheeze or crackles HEART/CVS: regular rate & rhythm and no murmurs; No lower extremity edema ABDOMEN: abdomen soft, non-tender and normal bowel sounds Musculoskeletal:no cyanosis of digits and no clubbing  PSYCH: alert & oriented x 3 with fluent speech NEURO: no focal motor/sensory deficits SKIN: no rashes or significant lesions LABORATORY DATA:  I have reviewed the data as listed    Component Value Date/Time   NA 131* 11/25/2015 0844   K 3.2* 11/25/2015 0844   CL 97* 11/25/2015 0844   CO2 25 11/25/2015 0844   GLUCOSE 123* 11/25/2015 0844   BUN 8 11/25/2015 0844   CREATININE 0.86 11/25/2015 0844   CALCIUM 8.7* 11/25/2015 0844   PROT 8.2* 11/22/2015 0902   ALBUMIN 3.2* 11/22/2015 0902   AST 18 11/22/2015 0902   ALT 13* 11/22/2015 0902   ALKPHOS 76 11/22/2015 0902   BILITOT 0.4 11/22/2015 0902   GFRNONAA >60 11/25/2015 0844   GFRAA >60 11/25/2015 0844    No results found for: SPEP, UPEP  Lab Results  Component Value Date   WBC 15.7* 11/25/2015   NEUTROABS 14.1* 11/25/2015   HGB 9.8* 11/25/2015   HCT 30.4* 11/25/2015   MCV 76.7* 11/25/2015   PLT 271 11/25/2015      Chemistry       Component Value Date/Time   NA 131* 11/25/2015 0844   K 3.2* 11/25/2015 0844   CL 97* 11/25/2015 0844   CO2 25 11/25/2015 0844   BUN 8 11/25/2015 0844   CREATININE 0.86 11/25/2015 0844      Component Value Date/Time   CALCIUM 8.7* 11/25/2015 0844   ALKPHOS 76 11/22/2015 0902   AST 18 11/22/2015 0902   ALT 13* 11/22/2015 0902   BILITOT 0.4 11/22/2015 0902       ASSESSMENT & PLAN:   # Likely endometrial adenocarcinoma [? Endocervical; given P 16]- stage IV. Status post cycle #1 of carbotaxol. Patient tolerated chemotherapy with mild to moderate difficulties [C discussion below]. Proceed with cycle #2 as planned in approximately 10 days from now.  #  Nausea vomiting- question underlying malignancy/chemotherapy. Recommend continued use of antiemetics around the clock. MRI brain negative for metastases. We'll plan for IV fluids post chemotherapy cycle #2.  # Skin rash bilateral upper extremities- unclear etiology. Question allergic reaction. Recommend prednisone 60 mg taper over 10 days. Patient was asked to call us this Friday to inform us regarding improvement of the rash.  # Poor appetite- hopefully the prednisone should also help with appetite.  25 minutes face-to-face with the patient discussing the above plan of care; more than 50% of time spent on prognosis/ natural history; counseling and coordination.        Cammie Sickle, MD 11/25/2015 4:32 PM

## 2015-11-25 NOTE — Progress Notes (Signed)
Pt seen in evaluation today in follow-up after her chemotherapy treatment. She has developed a rash on her bilateral upper extremities, chest and neck. "I tried an over the counter anti itch cream but this didn't help."  She reports 2-3 loose stools per day since last Friday. She is "not using any imodium". "I have persistent dry heaves and nausea. The Zofran is the only thing that works for me." "I haven't taken any blood pressure medications since Saturday due to the persistent queasiness."

## 2015-11-27 ENCOUNTER — Encounter: Payer: Self-pay | Admitting: *Deleted

## 2015-11-27 ENCOUNTER — Telehealth: Payer: Self-pay | Admitting: *Deleted

## 2015-11-27 NOTE — Telephone Encounter (Signed)
Contacted pt's insurance through [employer].  [id number] option #3.  Insurance approved Marinol 5 mg 1 tablet twice a daily with meals. Dx: Chemo induced N&V, wt loss, decreased appetite, h/o endometrial cancer; pt gags when she drinks fluids. Insurance approved Marinol (for dates of 08/29/15-11/26/16).  I informed the patient and her pharmacy that this drug was approved.  Pharmacy states that they currently do not have the drug in stock and they can order it and have it ready by tomorrow morning.

## 2015-12-06 ENCOUNTER — Inpatient Hospital Stay: Payer: Medicare Other

## 2015-12-06 ENCOUNTER — Inpatient Hospital Stay (HOSPITAL_BASED_OUTPATIENT_CLINIC_OR_DEPARTMENT_OTHER): Payer: Medicare Other | Admitting: Internal Medicine

## 2015-12-06 VITALS — BP 133/82 | HR 101 | Resp 18

## 2015-12-06 DIAGNOSIS — C541 Malignant neoplasm of endometrium: Secondary | ICD-10-CM

## 2015-12-06 DIAGNOSIS — C786 Secondary malignant neoplasm of retroperitoneum and peritoneum: Secondary | ICD-10-CM

## 2015-12-06 DIAGNOSIS — A630 Anogenital (venereal) warts: Secondary | ICD-10-CM

## 2015-12-06 DIAGNOSIS — Z7982 Long term (current) use of aspirin: Secondary | ICD-10-CM

## 2015-12-06 DIAGNOSIS — R21 Rash and other nonspecific skin eruption: Secondary | ICD-10-CM | POA: Diagnosis not present

## 2015-12-06 DIAGNOSIS — F418 Other specified anxiety disorders: Secondary | ICD-10-CM

## 2015-12-06 DIAGNOSIS — E860 Dehydration: Secondary | ICD-10-CM | POA: Insufficient documentation

## 2015-12-06 DIAGNOSIS — Z85828 Personal history of other malignant neoplasm of skin: Secondary | ICD-10-CM

## 2015-12-06 DIAGNOSIS — R481 Agnosia: Secondary | ICD-10-CM

## 2015-12-06 DIAGNOSIS — G5600 Carpal tunnel syndrome, unspecified upper limb: Secondary | ICD-10-CM

## 2015-12-06 DIAGNOSIS — C778 Secondary and unspecified malignant neoplasm of lymph nodes of multiple regions: Secondary | ICD-10-CM | POA: Diagnosis not present

## 2015-12-06 DIAGNOSIS — I10 Essential (primary) hypertension: Secondary | ICD-10-CM

## 2015-12-06 DIAGNOSIS — R112 Nausea with vomiting, unspecified: Secondary | ICD-10-CM

## 2015-12-06 DIAGNOSIS — C801 Malignant (primary) neoplasm, unspecified: Secondary | ICD-10-CM

## 2015-12-06 DIAGNOSIS — R630 Anorexia: Secondary | ICD-10-CM

## 2015-12-06 DIAGNOSIS — R430 Anosmia: Secondary | ICD-10-CM

## 2015-12-06 DIAGNOSIS — R918 Other nonspecific abnormal finding of lung field: Secondary | ICD-10-CM

## 2015-12-06 DIAGNOSIS — R591 Generalized enlarged lymph nodes: Secondary | ICD-10-CM

## 2015-12-06 DIAGNOSIS — R634 Abnormal weight loss: Secondary | ICD-10-CM

## 2015-12-06 DIAGNOSIS — Z79899 Other long term (current) drug therapy: Secondary | ICD-10-CM

## 2015-12-06 DIAGNOSIS — Z8041 Family history of malignant neoplasm of ovary: Secondary | ICD-10-CM

## 2015-12-06 DIAGNOSIS — Z803 Family history of malignant neoplasm of breast: Secondary | ICD-10-CM

## 2015-12-06 DIAGNOSIS — Z5111 Encounter for antineoplastic chemotherapy: Secondary | ICD-10-CM | POA: Diagnosis not present

## 2015-12-06 LAB — COMPREHENSIVE METABOLIC PANEL
ALBUMIN: 2.9 g/dL — AB (ref 3.5–5.0)
ALT: 9 U/L — ABNORMAL LOW (ref 14–54)
ANION GAP: 9 (ref 5–15)
AST: 15 U/L (ref 15–41)
Alkaline Phosphatase: 74 U/L (ref 38–126)
BUN: 9 mg/dL (ref 6–20)
CHLORIDE: 97 mmol/L — AB (ref 101–111)
CO2: 26 mmol/L (ref 22–32)
Calcium: 9.1 mg/dL (ref 8.9–10.3)
Creatinine, Ser: 0.71 mg/dL (ref 0.44–1.00)
GFR calc Af Amer: >60 mL/min (ref >60)
GFR calc non Af Amer: >60 mL/min (ref >60)
GLUCOSE: 125 mg/dL — AB (ref 65–99)
POTASSIUM: 4.1 mmol/L (ref 3.5–5.1)
SODIUM: 132 mmol/L — AB (ref 135–145)
Total Bilirubin: 0.5 mg/dL (ref 0.3–1.2)
Total Protein: 8.1 g/dL (ref 6.5–8.1)

## 2015-12-06 LAB — CBC WITH DIFFERENTIAL/PLATELET
BASOS ABS: 0 10*3/uL (ref 0–0.1)
BASOS PCT: 1 %
EOS ABS: 0 10*3/uL (ref 0–0.7)
EOS PCT: 0 %
HCT: 29.9 % — ABNORMAL LOW (ref 35.0–47.0)
Hemoglobin: 9.8 g/dL — ABNORMAL LOW (ref 12.0–16.0)
Lymphocytes Relative: 7 %
Lymphs Abs: 0.6 10*3/uL — ABNORMAL LOW (ref 1.0–3.6)
MCH: 25.1 pg — ABNORMAL LOW (ref 26.0–34.0)
MCHC: 32.8 g/dL (ref 32.0–36.0)
MCV: 76.6 fL — ABNORMAL LOW (ref 80.0–100.0)
MONO ABS: 0.6 10*3/uL (ref 0.2–0.9)
Monocytes Relative: 8 %
NEUTROS ABS: 7.1 10*3/uL — AB (ref 1.4–6.5)
Neutrophils Relative %: 84 %
PLATELETS: 410 10*3/uL (ref 150–440)
RBC: 3.9 MIL/uL (ref 3.80–5.20)
RDW: 16.8 % — AB (ref 11.5–14.5)
WBC: 8.4 10*3/uL (ref 3.6–11.0)

## 2015-12-06 MED ORDER — PEGFILGRASTIM 6 MG/0.6ML ~~LOC~~ PSKT
6.0000 mg | PREFILLED_SYRINGE | Freq: Once | SUBCUTANEOUS | Status: AC
  Administered 2015-12-06: 6 mg via SUBCUTANEOUS
  Filled 2015-12-06: qty 0.6

## 2015-12-06 MED ORDER — SODIUM CHLORIDE 0.9 % IV SOLN
403.5000 mg | Freq: Once | INTRAVENOUS | Status: AC
  Administered 2015-12-06: 400 mg via INTRAVENOUS
  Filled 2015-12-06: qty 40

## 2015-12-06 MED ORDER — PACLITAXEL CHEMO INJECTION 300 MG/50ML: 175.0000 mg/m2 | Freq: Once | INTRAVENOUS | Status: DC

## 2015-12-06 MED ORDER — DEXAMETHASONE SODIUM PHOSPHATE 100 MG/10ML IJ SOLN
20.0000 mg | Freq: Once | INTRAMUSCULAR | Status: AC
  Administered 2015-12-06: 20 mg via INTRAVENOUS
  Filled 2015-12-06: qty 2

## 2015-12-06 MED ORDER — LORAZEPAM 2 MG/ML IJ SOLN
INTRAMUSCULAR | Status: AC
  Filled 2015-12-06: qty 1

## 2015-12-06 MED ORDER — FAMOTIDINE IN NACL 20-0.9 MG/50ML-% IV SOLN
20.0000 mg | Freq: Once | INTRAVENOUS | Status: AC
  Administered 2015-12-06: 20 mg via INTRAVENOUS
  Filled 2015-12-06: qty 50

## 2015-12-06 MED ORDER — DIPHENHYDRAMINE HCL 50 MG/ML IJ SOLN
50.0000 mg | Freq: Once | INTRAMUSCULAR | Status: AC
  Administered 2015-12-06: 50 mg via INTRAVENOUS
  Filled 2015-12-06: qty 1

## 2015-12-06 MED ORDER — SODIUM CHLORIDE 0.9 % IV SOLN
175.0000 mg/m2 | Freq: Once | INTRAVENOUS | Status: AC
  Administered 2015-12-06: 312 mg via INTRAVENOUS
  Filled 2015-12-06: qty 52

## 2015-12-06 MED ORDER — LORAZEPAM 2 MG/ML IJ SOLN
1.0000 mg | Freq: Once | INTRAMUSCULAR | Status: AC
  Administered 2015-12-06: 1 mg via INTRAVENOUS

## 2015-12-06 MED ORDER — PALONOSETRON HCL INJECTION 0.25 MG/5ML
0.2500 mg | Freq: Once | INTRAVENOUS | Status: AC
  Administered 2015-12-06: 0.25 mg via INTRAVENOUS
  Filled 2015-12-06: qty 5

## 2015-12-06 MED ORDER — SODIUM CHLORIDE 0.9 % IV SOLN
Freq: Once | INTRAVENOUS | Status: AC
  Administered 2015-12-06: 10:00:00 via INTRAVENOUS
  Filled 2015-12-06: qty 1000

## 2015-12-06 NOTE — Progress Notes (Signed)
[place] OFFICE PROGRESS NOTE  Patient Care Team: Petra Kuba, MD as PCP - General (Family Medicine) Clent Jacks, RN as Registered Nurse   SUMMARY OF ONCOLOGIC HISTORY:  # FEB 2017- STAGE IV ENDOMETRIAL ADENO CA [s/p Inguinal LN Bx; s/p endocervical Bx- pos; p16 pos? endocervical] Lymphadenopathy-RP/Ingiunal LN; CT-PET  bil pul nodules [sub-cm]/ MRI- pelvis; March 10th- Carbo-taxol  # L-1spinous process lytic lesion [on PET]  INTERVAL HISTORY:  70 year old female patient with with above history of metastatic endometrial cancer currently on first an therapy with [place] Taxol except for follow-up. Patient currently status post 1 cycle of chemotherapy.  Mild nausea with vomiting- once or twice the whole week. She is an antiemetics. However poor appetite and poor by mouth intake. No constipation diarrhea or sores in the mouth.  Rash on the upper extremity is improved since being on the prednisone.  She denies any pain. Denies any tingling and numbness.   REVIEW OF SYSTEMS:  A complete 10 point review of system is done which is negative except mentioned above/history of present illness.   PAST MEDICAL HISTORY :  Past Medical History  Diagnosis Date  . Hypertension   . Carpal tunnel syndrome   . Lymphadenopathy 10/25/15  . Decreased appetite   . Weight loss     30 lb weight loss since October 2016  . Anxiety   . Depression   . Nausea & vomiting   . Endometrial cancer ([place]) 11/06/2015  . Chemotherapy-induced nausea and vomiting   . Decrease in appetite   . Weight loss     PAST SURGICAL HISTORY :   Past Surgical History  Procedure Laterality Date  . Carpal tunnel release Left   . Skin cancer excision    . Flexible sigmoidoscopy  05/2015    performed by Dr. Petra Kuba,    FAMILY HISTORY :   Family History  Problem Relation Age of Onset  . Breast cancer Sister   . Hypertension Sister   . Hypertension Brother   . Ovarian cancer Paternal  Aunt   . Diabetes Maternal Grandmother   . Breast cancer Daughter   . Breast cancer Cousin     maternal    SOCIAL HISTORY:   Social History  Substance Use Topics  . Smoking status: Never Smoker   . Smokeless tobacco: Never Used  . Alcohol Use: No    ALLERGIES:  is allergic to ibuprofen and tape.  MEDICATIONS:  Current Outpatient Prescriptions  Medication Sig Dispense Refill  . amLODipine (NORVASC) 10 MG tablet Take 1 tablet by mouth daily. Reported on 11/25/2015    . aspirin EC 81 MG tablet Take 81 mg by mouth daily. Reported on 11/25/2015    . baclofen (LIORESAL) 10 MG tablet Take 10 mg by mouth as needed for muscle spasms. Reported on 11/25/2015    . dronabinol (MARINOL) 5 MG capsule Take 1 capsule (5 mg total) by mouth 2 (two) times daily before a meal. 60 capsule 1  . hydrochlorothiazide (HYDRODIURIL) 25 MG tablet Take 1 tablet by mouth daily. Reported on 11/25/2015    . HYDROcodone-acetaminophen (NORCO/VICODIN) 5-325 MG tablet Take 1 tablet by mouth every 6 (six) hours as needed for moderate pain. 40 tablet 0  . losartan (COZAAR) 100 MG tablet Take 1 tablet by mouth daily. Reported on 11/25/2015    . ondansetron (ZOFRAN) 8 MG tablet Start 3 days after chemo (Patient taking differently: Take 8 mg by mouth as needed for nausea or  vomiting. Start 3 days after chemo) 40 tablet 0  . predniSONE (DELTASONE) 20 MG tablet Take 3 tablets (60 mg total) by mouth daily with breakfast. x3 days; 2 pills x3 days; 1 pill x 3 days 30 tablet 0  . prochlorperazine (COMPAZINE) 10 MG tablet Take 1 tablet (10 mg total) by mouth every 6 (six) hours as needed for nausea or vomiting. 40 tablet 0  . promethazine (PHENERGAN) 25 MG tablet Take 1 tablet (25 mg total) by mouth every 6 (six) hours as needed for nausea or vomiting. 20 tablet 0   No current facility-administered medications for this visit.   Facility-Administered Medications Ordered in Other Visits  Medication Dose Route Frequency Provider Last  Rate Last Dose  . CARBOplatin (PARAPLATIN) 400 mg in sodium chloride 0.9 % 250 mL chemo infusion  400 mg Intravenous Once Cammie Sickle, MD      . PACLitaxel (TAXOL) 312 mg in sodium chloride 0.9 % 500 mL chemo infusion (> 55m/m2)  175 mg/m2 (Treatment Plan Actual) Intravenous Once GCammie Sickle MD   Stopped at 12/06/15 1437  . pegfilgrastim (NEULASTA ONPRO KIT) injection 6 mg  6 mg Subcutaneous Once GCammie Sickle MD        PHYSICAL EXAMINATION: ECOG PERFORMANCE STATUS: 1 - Symptomatic but completely ambulatory  There were no vitals taken for this visit.  There were no vitals filed for this visit. GENERAL: Well-nourished well-developed; Alert, no distress and Mildly comfortable given her nausea. She is accompanied by her husband. EYES: no pallor or icterus OROPHARYNX: no thrush or ulceration; good dentition  NECK: supple, no masses felt LYMPH: no palpable lymphadenopathy in the cervical ; 1 cm left axillary lymph node felt; about 1 cm left inguinal lymph node felt. LUNGS: clear to auscultation and No wheeze or crackles HEART/CVS: regular rate & rhythm and no murmurs; No lower extremity edema ABDOMEN: abdomen soft, non-tender and normal bowel sounds Musculoskeletal:no cyanosis of digits and no clubbing  PSYCH: alert & oriented x 3 with fluent speech NEURO: no focal motor/sensory deficits SKIN: no rashes or significant lesions LABORATORY DATA:  I have reviewed the data as listed    Component Value Date/Time   NA 132* 12/06/2015 0855   K 4.1 12/06/2015 0855   CL 97* 12/06/2015 0855   CO2 26 12/06/2015 0855   GLUCOSE 125* 12/06/2015 0855   BUN 9 12/06/2015 0855   CREATININE 0.71 12/06/2015 0855   CALCIUM 9.1 12/06/2015 0855   PROT 8.1 12/06/2015 0855   ALBUMIN 2.9* 12/06/2015 0855   AST 15 12/06/2015 0855   ALT 9* 12/06/2015 0855   ALKPHOS 74 12/06/2015 0855   BILITOT 0.5 12/06/2015 0855   GFRNONAA >60 12/06/2015 0855   GFRAA >60 12/06/2015 0855     No results found for: SPEP, UPEP  Lab Results  Component Value Date   WBC 8.4 12/06/2015   NEUTROABS 7.1* 12/06/2015   HGB 9.8* 12/06/2015   HCT 29.9* 12/06/2015   MCV 76.6* 12/06/2015   PLT 410 12/06/2015      Chemistry      Component Value Date/Time   NA 132* 12/06/2015 0855   K 4.1 12/06/2015 0855   CL 97* 12/06/2015 0855   CO2 26 12/06/2015 0855   BUN 9 12/06/2015 0855   CREATININE 0.71 12/06/2015 0855      Component Value Date/Time   CALCIUM 9.1 12/06/2015 0855   ALKPHOS 74 12/06/2015 0855   AST 15 12/06/2015 0855   ALT 9* 12/06/2015 0855  BILITOT 0.5 12/06/2015 0855       ASSESSMENT & PLAN:   # Likely endometrial adenocarcinoma [? Endocervical; given P 16]- stage IV. Status post cycle #1 of carbotaxol. Patient tolerated chemotherapy with mild to moderate difficulties [C discussion below].   # Proceed with cycle #2 as planned; CBC unremarkable except for hemoglobin 9.8. CMP- unremarkable. We'll plan to get a CT scan for reevaluation after 3 cycles.  # Nausea vomiting/dehydration- question underlying malignancy/chemotherapy. We will plan IV fluids/antiemetics in every 3-4 days as needed. Plan IV fluids on April 3/and also in 10 days. ? Anticipatory nausea/vomitting- recommend adding Iv ativan prior to chemo.   # Poor appetite/weight loss of 10 pounds- on marinol; recommend dietary evaluation.  # Follow-up with me in 10 days with CBC BMP IV fluids.  25 minutes face-to-face with the patient discussing the above plan of care; more than 50% of time spent on prognosis/ natural history; counseling and coordination.      Cammie Sickle, MD 12/06/2015 1:12 PM

## 2015-12-06 NOTE — Progress Notes (Signed)
Pt is agreeable for dietary referral. Dietary referral was initiated. Pt has had persistent weight loss, loss of perception of taste/smell, poor dietary intake.

## 2015-12-09 ENCOUNTER — Inpatient Hospital Stay: Payer: Medicare Other | Attending: Internal Medicine

## 2015-12-09 DIAGNOSIS — Z803 Family history of malignant neoplasm of breast: Secondary | ICD-10-CM | POA: Diagnosis not present

## 2015-12-09 DIAGNOSIS — R591 Generalized enlarged lymph nodes: Secondary | ICD-10-CM | POA: Diagnosis not present

## 2015-12-09 DIAGNOSIS — R634 Abnormal weight loss: Secondary | ICD-10-CM | POA: Diagnosis not present

## 2015-12-09 DIAGNOSIS — I10 Essential (primary) hypertension: Secondary | ICD-10-CM | POA: Insufficient documentation

## 2015-12-09 DIAGNOSIS — D72829 Elevated white blood cell count, unspecified: Secondary | ICD-10-CM | POA: Insufficient documentation

## 2015-12-09 DIAGNOSIS — Z79899 Other long term (current) drug therapy: Secondary | ICD-10-CM | POA: Insufficient documentation

## 2015-12-09 DIAGNOSIS — C7951 Secondary malignant neoplasm of bone: Secondary | ICD-10-CM | POA: Diagnosis not present

## 2015-12-09 DIAGNOSIS — R112 Nausea with vomiting, unspecified: Secondary | ICD-10-CM | POA: Insufficient documentation

## 2015-12-09 DIAGNOSIS — C541 Malignant neoplasm of endometrium: Secondary | ICD-10-CM | POA: Insufficient documentation

## 2015-12-09 DIAGNOSIS — Z7982 Long term (current) use of aspirin: Secondary | ICD-10-CM | POA: Diagnosis not present

## 2015-12-09 DIAGNOSIS — Z7689 Persons encountering health services in other specified circumstances: Secondary | ICD-10-CM | POA: Diagnosis not present

## 2015-12-09 DIAGNOSIS — Z5111 Encounter for antineoplastic chemotherapy: Secondary | ICD-10-CM | POA: Insufficient documentation

## 2015-12-09 DIAGNOSIS — F418 Other specified anxiety disorders: Secondary | ICD-10-CM | POA: Diagnosis not present

## 2015-12-09 DIAGNOSIS — E871 Hypo-osmolality and hyponatremia: Secondary | ICD-10-CM | POA: Diagnosis not present

## 2015-12-09 DIAGNOSIS — E860 Dehydration: Secondary | ICD-10-CM

## 2015-12-09 DIAGNOSIS — Z8041 Family history of malignant neoplasm of ovary: Secondary | ICD-10-CM | POA: Insufficient documentation

## 2015-12-09 DIAGNOSIS — R630 Anorexia: Secondary | ICD-10-CM | POA: Diagnosis not present

## 2015-12-09 MED ORDER — SODIUM CHLORIDE 0.9 % IV SOLN
Freq: Once | INTRAVENOUS | Status: AC
  Administered 2015-12-09: 15:00:00 via INTRAVENOUS
  Filled 2015-12-09: qty 1000

## 2015-12-09 MED ORDER — SODIUM CHLORIDE 0.9 % IV SOLN
Freq: Once | INTRAVENOUS | Status: AC
  Administered 2015-12-09: 15:00:00 via INTRAVENOUS
  Filled 2015-12-09: qty 4

## 2015-12-09 MED ORDER — HEPARIN SOD (PORK) LOCK FLUSH 100 UNIT/ML IV SOLN: 500.0000 [IU] | Freq: Once | INTRAVENOUS | Status: DC | PRN

## 2015-12-12 ENCOUNTER — Encounter: Payer: Self-pay | Admitting: Dietician

## 2015-12-13 ENCOUNTER — Other Ambulatory Visit: Payer: Self-pay | Admitting: Internal Medicine

## 2015-12-13 NOTE — Progress Notes (Signed)
Neulasta/onpro injector- postchemotherapy- prophylactic to prevent neutropenic infections/fevers.  Dr.B

## 2015-12-16 ENCOUNTER — Other Ambulatory Visit: Payer: Medicare Other

## 2015-12-16 ENCOUNTER — Inpatient Hospital Stay: Payer: Medicare Other

## 2015-12-16 ENCOUNTER — Ambulatory Visit: Payer: Medicare Other | Admitting: Internal Medicine

## 2015-12-16 ENCOUNTER — Inpatient Hospital Stay (HOSPITAL_BASED_OUTPATIENT_CLINIC_OR_DEPARTMENT_OTHER): Payer: Medicare Other | Admitting: Internal Medicine

## 2015-12-16 ENCOUNTER — Ambulatory Visit: Payer: Medicare Other

## 2015-12-16 VITALS — BP 144/87 | HR 102 | Temp 97.7°F | Resp 18 | Wt 139.6 lb

## 2015-12-16 DIAGNOSIS — D72829 Elevated white blood cell count, unspecified: Secondary | ICD-10-CM | POA: Diagnosis not present

## 2015-12-16 DIAGNOSIS — Z79899 Other long term (current) drug therapy: Secondary | ICD-10-CM

## 2015-12-16 DIAGNOSIS — E871 Hypo-osmolality and hyponatremia: Secondary | ICD-10-CM

## 2015-12-16 DIAGNOSIS — Z7982 Long term (current) use of aspirin: Secondary | ICD-10-CM

## 2015-12-16 DIAGNOSIS — C541 Malignant neoplasm of endometrium: Secondary | ICD-10-CM

## 2015-12-16 DIAGNOSIS — Z803 Family history of malignant neoplasm of breast: Secondary | ICD-10-CM

## 2015-12-16 DIAGNOSIS — Z5111 Encounter for antineoplastic chemotherapy: Secondary | ICD-10-CM | POA: Diagnosis not present

## 2015-12-16 DIAGNOSIS — R630 Anorexia: Secondary | ICD-10-CM

## 2015-12-16 DIAGNOSIS — R591 Generalized enlarged lymph nodes: Secondary | ICD-10-CM

## 2015-12-16 DIAGNOSIS — R634 Abnormal weight loss: Secondary | ICD-10-CM

## 2015-12-16 DIAGNOSIS — F418 Other specified anxiety disorders: Secondary | ICD-10-CM

## 2015-12-16 DIAGNOSIS — Z8041 Family history of malignant neoplasm of ovary: Secondary | ICD-10-CM

## 2015-12-16 DIAGNOSIS — I10 Essential (primary) hypertension: Secondary | ICD-10-CM

## 2015-12-16 LAB — BASIC METABOLIC PANEL
ANION GAP: 7 (ref 5–15)
BUN: 12 mg/dL (ref 6–20)
CALCIUM: 9.3 mg/dL (ref 8.9–10.3)
CO2: 25 mmol/L (ref 22–32)
Chloride: 96 mmol/L — ABNORMAL LOW (ref 101–111)
Creatinine, Ser: 0.87 mg/dL (ref 0.44–1.00)
GFR calc Af Amer: >60 mL/min (ref >60)
GFR calc non Af Amer: >60 mL/min (ref >60)
GLUCOSE: 132 mg/dL — AB (ref 65–99)
Potassium: 3.8 mmol/L (ref 3.5–5.1)
Sodium: 128 mmol/L — ABNORMAL LOW (ref 135–145)

## 2015-12-16 LAB — CBC WITH DIFFERENTIAL/PLATELET
BASOS ABS: 0 10*3/uL (ref 0–0.1)
Basophils Relative: 0 %
Eosinophils Absolute: 0 10*3/uL (ref 0–0.7)
Eosinophils Relative: 0 %
HEMATOCRIT: 30 % — AB (ref 35.0–47.0)
Hemoglobin: 9.7 g/dL — ABNORMAL LOW (ref 12.0–16.0)
LYMPHS ABS: 1.7 10*3/uL (ref 1.0–3.6)
LYMPHS PCT: 8 %
MCH: 25 pg — ABNORMAL LOW (ref 26.0–34.0)
MCHC: 32.4 g/dL (ref 32.0–36.0)
MCV: 77.1 fL — AB (ref 80.0–100.0)
MONO ABS: 1.1 10*3/uL — AB (ref 0.2–0.9)
MONOS PCT: 5 %
NEUTROS ABS: 18.7 10*3/uL — AB (ref 1.4–6.5)
Neutrophils Relative %: 87 %
Platelets: 347 10*3/uL (ref 150–440)
RBC: 3.9 MIL/uL (ref 3.80–5.20)
RDW: 17.3 % — AB (ref 11.5–14.5)
WBC: 21.6 10*3/uL — ABNORMAL HIGH (ref 3.6–11.0)

## 2015-12-16 NOTE — Progress Notes (Signed)
Patient would like to cancel dietary referral to [place].

## 2015-12-16 NOTE — Progress Notes (Signed)
[place] OFFICE PROGRESS NOTE  Patient Care Team: Petra Kuba, MD as PCP - General (Family Medicine) Clent Jacks, RN as Registered Nurse   SUMMARY OF ONCOLOGIC HISTORY:  # FEB 2017- STAGE IV ENDOMETRIAL ADENO CA [s/p Inguinal LN Bx; s/p endocervical Bx- pos; p16 pos? endocervical] Lymphadenopathy-RP/Ingiunal LN; CT-PET  bil pul nodules [sub-cm]/ MRI- pelvis; March 10th- Carbo-taxol  # L-1spinous process lytic lesion [on PET]  INTERVAL HISTORY:  70 year old female patient with with above history of metastatic endometrial cancer currently on first line chemotherapy with [place] Taxol except for follow-up. Patient currently status post 2 cycle of chemotherapy appx 10 day ago.  Patient continues to have poor appetite. However nausea vomiting improved. Didn't have to take any antiemetics.  She had mild diarrhea postchemotherapy which is resolved. No sores in the mouth. No constipation. No further skin rash.  She denies any pain. Denies any tingling and numbness.   REVIEW OF SYSTEMS:  A complete 10 point review of system is done which is negative except mentioned above/history of present illness.   PAST MEDICAL HISTORY :  Past Medical History  Diagnosis Date  . Hypertension   . Carpal tunnel syndrome   . Lymphadenopathy 10/25/15  . Decreased appetite   . Weight loss     30 lb weight loss since October 2016  . Anxiety   . Depression   . Nausea & vomiting   . Endometrial cancer ([place]) 11/06/2015  . Chemotherapy-induced nausea and vomiting   . Decrease in appetite   . Weight loss     PAST SURGICAL HISTORY :   Past Surgical History  Procedure Laterality Date  . Carpal tunnel release Left   . Skin cancer excision    . Flexible sigmoidoscopy  05/2015    performed by Dr. Petra Kuba,    FAMILY HISTORY :   Family History  Problem Relation Age of Onset  . Breast cancer Sister   . Hypertension Sister   . Hypertension Brother   . Ovarian cancer  Paternal Aunt   . Diabetes Maternal Grandmother   . Breast cancer Daughter   . Breast cancer Cousin     maternal    SOCIAL HISTORY:   Social History  Substance Use Topics  . Smoking status: Never Smoker   . Smokeless tobacco: Never Used  . Alcohol Use: No    ALLERGIES:  is allergic to ibuprofen and tape.  MEDICATIONS:  Current Outpatient Prescriptions  Medication Sig Dispense Refill  . amLODipine (NORVASC) 10 MG tablet Take 1 tablet by mouth daily. Reported on 11/25/2015    . aspirin EC 81 MG tablet Take 81 mg by mouth daily. Reported on 11/25/2015    . dronabinol (MARINOL) 5 MG capsule Take 1 capsule (5 mg total) by mouth 2 (two) times daily before a meal. 60 capsule 1  . hydrochlorothiazide (HYDRODIURIL) 25 MG tablet Take 1 tablet by mouth daily. Reported on 11/25/2015    . HYDROcodone-acetaminophen (NORCO/VICODIN) 5-325 MG tablet Take 1 tablet by mouth every 6 (six) hours as needed for moderate pain. 40 tablet 0  . losartan (COZAAR) 100 MG tablet Take 1 tablet by mouth daily. Reported on 11/25/2015    . ondansetron (ZOFRAN) 8 MG tablet Start 3 days after chemo (Patient taking differently: Take 8 mg by mouth as needed for nausea or vomiting. Start 3 days after chemo) 40 tablet 0  . predniSONE (DELTASONE) 20 MG tablet Take 3 tablets (60 mg total) by mouth daily  with breakfast. x3 days; 2 pills x3 days; 1 pill x 3 days 30 tablet 0  . prochlorperazine (COMPAZINE) 10 MG tablet Take 1 tablet (10 mg total) by mouth every 6 (six) hours as needed for nausea or vomiting. 40 tablet 0  . promethazine (PHENERGAN) 25 MG tablet Take 1 tablet (25 mg total) by mouth every 6 (six) hours as needed for nausea or vomiting. 20 tablet 0   No current facility-administered medications for this visit.    PHYSICAL EXAMINATION: ECOG PERFORMANCE STATUS: 1 - Symptomatic but completely ambulatory  BP 144/87 mmHg  Pulse 102  Temp(Src) 97.7 F (36.5 C) (Tympanic)  Resp 18  Wt 139 lb 8.8 oz (63.3 kg)  Filed  Weights   12/16/15 1336  Weight: 139 lb 8.8 oz (63.3 kg)   GENERAL: Well-nourished well-developed; Alert, no distress. EYES: no pallor or icterus OROPHARYNX: no thrush or ulceration; good dentition  NECK: supple, no masses felt LUNGS: clear to auscultation and No wheeze or crackles HEART/CVS: regular rate & rhythm and no murmurs; No lower extremity edema ABDOMEN: abdomen soft, non-tender and normal bowel sounds Musculoskeletal:no cyanosis of digits and no clubbing  PSYCH: alert & oriented x 3 with fluent speech NEURO: no focal motor/sensory deficits SKIN: no rashes or significant lesions LABORATORY DATA:  I have reviewed the data as listed    Component Value Date/Time   NA 128* 12/16/2015 1311   K 3.8 12/16/2015 1311   CL 96* 12/16/2015 1311   CO2 25 12/16/2015 1311   GLUCOSE 132* 12/16/2015 1311   BUN 12 12/16/2015 1311   CREATININE 0.87 12/16/2015 1311   CALCIUM 9.3 12/16/2015 1311   PROT 8.1 12/06/2015 0855   ALBUMIN 2.9* 12/06/2015 0855   AST 15 12/06/2015 0855   ALT 9* 12/06/2015 0855   ALKPHOS 74 12/06/2015 0855   BILITOT 0.5 12/06/2015 0855   GFRNONAA >60 12/16/2015 1311   GFRAA >60 12/16/2015 1311    No results found for: SPEP, UPEP  Lab Results  Component Value Date   WBC 21.6* 12/16/2015   NEUTROABS 18.7* 12/16/2015   HGB 9.7* 12/16/2015   HCT 30.0* 12/16/2015   MCV 77.1* 12/16/2015   PLT 347 12/16/2015      Chemistry      Component Value Date/Time   NA 128* 12/16/2015 1311   K 3.8 12/16/2015 1311   CL 96* 12/16/2015 1311   CO2 25 12/16/2015 1311   BUN 12 12/16/2015 1311   CREATININE 0.87 12/16/2015 1311      Component Value Date/Time   CALCIUM 9.3 12/16/2015 1311   ALKPHOS 74 12/06/2015 0855   AST 15 12/06/2015 0855   ALT 9* 12/06/2015 0855   BILITOT 0.5 12/06/2015 0855       ASSESSMENT & PLAN:   # Likely endometrial adenocarcinoma [? Endocervical; given P 16]- stage IV. Status post cycle #2 of carbotaxol. Patient tolerated  chemotherapy with mild to moderate difficulties [C discussion below]. Currently status post cycle #2 approximately 10 days ago. Discussed that patient will get a CT scan after third cycle.  # Nausea vomiting/dehydration- question underlying malignancy/chemotherapy. Currently improved. Hold off any fluids.  # Hyponatremia sodium 128/recommend discussion patient of hydrochlorothiazide. Increased fluid with salt  # Poor appetite weight loss about 5 pounds- patient declines dietary referral; on Marinol.  # Leukocytosis [place] count 21,000- likely from Neulasta.   # Follow-up in approximately 10 days/third cycle of chemotherapy.    Cammie Sickle, MD 12/16/2015 1:50 PM

## 2015-12-16 NOTE — Patient Instructions (Signed)
Please hold the hydrochlorothiazide.

## 2015-12-26 ENCOUNTER — Ambulatory Visit: Payer: Medicare Other | Admitting: Dietician

## 2015-12-27 ENCOUNTER — Inpatient Hospital Stay: Payer: Medicare Other

## 2015-12-27 ENCOUNTER — Ambulatory Visit: Payer: Medicare Other

## 2015-12-27 ENCOUNTER — Inpatient Hospital Stay (HOSPITAL_BASED_OUTPATIENT_CLINIC_OR_DEPARTMENT_OTHER): Payer: Medicare Other | Admitting: Internal Medicine

## 2015-12-27 VITALS — BP 104/84 | HR 92 | Temp 96.5°F | Resp 18 | Wt 143.1 lb

## 2015-12-27 VITALS — BP 122/87 | HR 98 | Temp 96.5°F

## 2015-12-27 DIAGNOSIS — Z8041 Family history of malignant neoplasm of ovary: Secondary | ICD-10-CM

## 2015-12-27 DIAGNOSIS — D72829 Elevated white blood cell count, unspecified: Secondary | ICD-10-CM

## 2015-12-27 DIAGNOSIS — R634 Abnormal weight loss: Secondary | ICD-10-CM

## 2015-12-27 DIAGNOSIS — C541 Malignant neoplasm of endometrium: Secondary | ICD-10-CM

## 2015-12-27 DIAGNOSIS — Z5111 Encounter for antineoplastic chemotherapy: Secondary | ICD-10-CM | POA: Diagnosis not present

## 2015-12-27 DIAGNOSIS — E871 Hypo-osmolality and hyponatremia: Secondary | ICD-10-CM | POA: Diagnosis not present

## 2015-12-27 DIAGNOSIS — Z79899 Other long term (current) drug therapy: Secondary | ICD-10-CM

## 2015-12-27 DIAGNOSIS — R630 Anorexia: Secondary | ICD-10-CM

## 2015-12-27 DIAGNOSIS — R112 Nausea with vomiting, unspecified: Secondary | ICD-10-CM | POA: Diagnosis not present

## 2015-12-27 DIAGNOSIS — C7951 Secondary malignant neoplasm of bone: Secondary | ICD-10-CM

## 2015-12-27 DIAGNOSIS — Z803 Family history of malignant neoplasm of breast: Secondary | ICD-10-CM

## 2015-12-27 DIAGNOSIS — R591 Generalized enlarged lymph nodes: Secondary | ICD-10-CM

## 2015-12-27 DIAGNOSIS — I10 Essential (primary) hypertension: Secondary | ICD-10-CM

## 2015-12-27 DIAGNOSIS — F418 Other specified anxiety disorders: Secondary | ICD-10-CM

## 2015-12-27 DIAGNOSIS — Z7982 Long term (current) use of aspirin: Secondary | ICD-10-CM

## 2015-12-27 LAB — COMPREHENSIVE METABOLIC PANEL
ALT: 10 U/L — AB (ref 14–54)
ANION GAP: 4 — AB (ref 5–15)
AST: 17 U/L (ref 15–41)
Albumin: 3 g/dL — ABNORMAL LOW (ref 3.5–5.0)
Alkaline Phosphatase: 80 U/L (ref 38–126)
BUN: 8 mg/dL (ref 6–20)
CALCIUM: 9.2 mg/dL (ref 8.9–10.3)
CHLORIDE: 103 mmol/L (ref 101–111)
CO2: 27 mmol/L (ref 22–32)
CREATININE: 0.66 mg/dL (ref 0.44–1.00)
GFR calc non Af Amer: >60 mL/min (ref >60)
Glucose, Bld: 127 mg/dL — ABNORMAL HIGH (ref 65–99)
Potassium: 4.1 mmol/L (ref 3.5–5.1)
SODIUM: 134 mmol/L — AB (ref 135–145)
Total Bilirubin: 0.5 mg/dL (ref 0.3–1.2)
Total Protein: 7.6 g/dL (ref 6.5–8.1)

## 2015-12-27 LAB — CBC WITH DIFFERENTIAL/PLATELET
Basophils Absolute: 0.1 10*3/uL (ref 0–0.1)
Basophils Relative: 1 %
EOS ABS: 0 10*3/uL (ref 0–0.7)
EOS PCT: 0 %
HCT: 28.8 % — ABNORMAL LOW (ref 35.0–47.0)
Hemoglobin: 9.6 g/dL — ABNORMAL LOW (ref 12.0–16.0)
LYMPHS ABS: 0.7 10*3/uL — AB (ref 1.0–3.6)
LYMPHS PCT: 11 %
MCH: 26.1 pg (ref 26.0–34.0)
MCHC: 33.3 g/dL (ref 32.0–36.0)
MCV: 78.3 fL — AB (ref 80.0–100.0)
MONO ABS: 0.6 10*3/uL (ref 0.2–0.9)
MONOS PCT: 8 %
Neutro Abs: 5.7 10*3/uL (ref 1.4–6.5)
Neutrophils Relative %: 80 %
PLATELETS: 367 10*3/uL (ref 150–440)
RBC: 3.68 MIL/uL — AB (ref 3.80–5.20)
RDW: 19.6 % — ABNORMAL HIGH (ref 11.5–14.5)
WBC: 7 10*3/uL (ref 3.6–11.0)

## 2015-12-27 MED ORDER — PALONOSETRON HCL INJECTION 0.25 MG/5ML
0.2500 mg | Freq: Once | INTRAVENOUS | Status: AC
  Administered 2015-12-27: 0.25 mg via INTRAVENOUS
  Filled 2015-12-27: qty 5

## 2015-12-27 MED ORDER — FAMOTIDINE IN NACL 20-0.9 MG/50ML-% IV SOLN
20.0000 mg | Freq: Once | INTRAVENOUS | Status: AC
Start: 2015-12-27 — End: 2015-12-27
  Administered 2015-12-27: 20 mg via INTRAVENOUS
  Filled 2015-12-27: qty 50

## 2015-12-27 MED ORDER — SODIUM CHLORIDE 0.9 % IV SOLN
175.0000 mg/m2 | Freq: Once | INTRAVENOUS | Status: AC
  Administered 2015-12-27: 312 mg via INTRAVENOUS
  Filled 2015-12-27: qty 52

## 2015-12-27 MED ORDER — DIPHENHYDRAMINE HCL 50 MG/ML IJ SOLN
50.0000 mg | Freq: Once | INTRAMUSCULAR | Status: AC
  Administered 2015-12-27: 50 mg via INTRAVENOUS
  Filled 2015-12-27: qty 1

## 2015-12-27 MED ORDER — SODIUM CHLORIDE 0.9 % IV SOLN
20.0000 mg | Freq: Once | INTRAVENOUS | Status: AC
  Administered 2015-12-27: 20 mg via INTRAVENOUS
  Filled 2015-12-27: qty 2

## 2015-12-27 MED ORDER — SODIUM CHLORIDE 0.9 % IV SOLN
403.5000 mg | Freq: Once | INTRAVENOUS | Status: AC
  Administered 2015-12-27: 400 mg via INTRAVENOUS
  Filled 2015-12-27: qty 40

## 2015-12-27 MED ORDER — SODIUM CHLORIDE 0.9 % IV SOLN
Freq: Once | INTRAVENOUS | Status: AC
  Administered 2015-12-27: 10:00:00 via INTRAVENOUS
  Filled 2015-12-27: qty 1000

## 2015-12-27 MED ORDER — PEGFILGRASTIM 6 MG/0.6ML ~~LOC~~ PSKT
6.0000 mg | PREFILLED_SYRINGE | Freq: Once | SUBCUTANEOUS | Status: AC
  Administered 2015-12-27: 6 mg via SUBCUTANEOUS
  Filled 2015-12-27: qty 0.6

## 2015-12-27 MED ORDER — LORAZEPAM 2 MG/ML IJ SOLN
1.0000 mg | Freq: Once | INTRAMUSCULAR | Status: AC
  Administered 2015-12-27: 1 mg via INTRAVENOUS
  Filled 2015-12-27: qty 1

## 2015-12-27 NOTE — Progress Notes (Signed)
[place] OFFICE PROGRESS NOTE  Patient Care Team: Petra Kuba, MD as PCP - General (Family Medicine) Clent Jacks, RN as Registered Nurse   SUMMARY OF ONCOLOGIC HISTORY:  # FEB 2017- STAGE IV ENDOMETRIAL ADENO CA [s/p Inguinal LN Bx; s/p endocervical Bx- pos; p16 pos? endocervical] Lymphadenopathy-RP/Ingiunal LN; CT-PET  bil pul nodules [sub-cm]/ MRI- pelvis; March 10th- Carbo-taxol  # L-1spinous process lytic lesion [on PET]  INTERVAL HISTORY:  70 year old female patient with with above history of metastatic endometrial cancer currently on first line chemotherapy with [place] Taxol except for follow-up. Patient currently status post 2 cycle of chemotherapy appx 3w ago.   Patient's appetite is improving. Intermittent nausea with vomiting however overall improving. She has gained about 4 pounds since the last visit. No diarrhea no constipation. No sores in the mouth. No constipation. No further skin rash. She denies any pain. Denies any tingling and numbness.   REVIEW OF SYSTEMS:  A complete 10 point review of system is done which is negative except mentioned above/history of present illness.   PAST MEDICAL HISTORY :  Past Medical History  Diagnosis Date  . Hypertension   . Carpal tunnel syndrome   . Lymphadenopathy 10/25/15  . Decreased appetite   . Weight loss     30 lb weight loss since October 2016  . Anxiety   . Depression   . Nausea & vomiting   . Endometrial cancer ([place]) 11/06/2015  . Chemotherapy-induced nausea and vomiting   . Decrease in appetite   . Weight loss     PAST SURGICAL HISTORY :   Past Surgical History  Procedure Laterality Date  . Carpal tunnel release Left   . Skin cancer excision    . Flexible sigmoidoscopy  05/2015    performed by Dr. Petra Kuba,    FAMILY HISTORY :   Family History  Problem Relation Age of Onset  . Breast cancer Sister   . Hypertension Sister   . Hypertension Brother   . Ovarian cancer  Paternal Aunt   . Diabetes Maternal Grandmother   . Breast cancer Daughter   . Breast cancer Cousin     maternal    SOCIAL HISTORY:   Social History  Substance Use Topics  . Smoking status: Never Smoker   . Smokeless tobacco: Never Used  . Alcohol Use: No    ALLERGIES:  is allergic to ibuprofen and tape.  MEDICATIONS:  Current Outpatient Prescriptions  Medication Sig Dispense Refill  . amLODipine (NORVASC) 10 MG tablet Take 1 tablet by mouth daily. Reported on 11/25/2015    . aspirin EC 81 MG tablet Take 81 mg by mouth daily. Reported on 11/25/2015    . dronabinol (MARINOL) 5 MG capsule Take 1 capsule (5 mg total) by mouth 2 (two) times daily before a meal. 60 capsule 1  . hydrochlorothiazide (HYDRODIURIL) 25 MG tablet Take 1 tablet by mouth daily. Reported on 11/25/2015    . HYDROcodone-acetaminophen (NORCO/VICODIN) 5-325 MG tablet Take 1 tablet by mouth every 6 (six) hours as needed for moderate pain. 40 tablet 0  . losartan (COZAAR) 100 MG tablet Take 1 tablet by mouth daily. Reported on 11/25/2015    . ondansetron (ZOFRAN) 8 MG tablet Start 3 days after chemo (Patient taking differently: Take 8 mg by mouth as needed for nausea or vomiting. Start 3 days after chemo) 40 tablet 0  . predniSONE (DELTASONE) 20 MG tablet Take 3 tablets (60 mg total) by mouth daily with  breakfast. x3 days; 2 pills x3 days; 1 pill x 3 days 30 tablet 0  . prochlorperazine (COMPAZINE) 10 MG tablet Take 1 tablet (10 mg total) by mouth every 6 (six) hours as needed for nausea or vomiting. 40 tablet 0  . promethazine (PHENERGAN) 25 MG tablet Take 1 tablet (25 mg total) by mouth every 6 (six) hours as needed for nausea or vomiting. 20 tablet 0   No current facility-administered medications for this visit.    PHYSICAL EXAMINATION: ECOG PERFORMANCE STATUS: 1 - Symptomatic but completely ambulatory  BP 104/84 mmHg  Pulse 92  Temp(Src) 96.5 F (35.8 C) (Tympanic)  Resp 18  Wt 143 lb 1.3 oz (64.9 kg)  Filed  Weights   12/27/15 0857  Weight: 143 lb 1.3 oz (64.9 kg)   GENERAL: Well-nourished well-developed; Alert, no distress. EYES: no pallor or icterus OROPHARYNX: no thrush or ulceration; good dentition  NECK: supple, no masses felt LUNGS: clear to auscultation and No wheeze or crackles HEART/CVS: regular rate & rhythm and no murmurs; No lower extremity edema ABDOMEN: abdomen soft, non-tender and normal bowel sounds; approximately 2 cm lymph node felt in the left inguinal region./no Significant improvement Musculoskeletal:no cyanosis of digits and no clubbing  PSYCH: alert & oriented x 3 with fluent speech NEURO: no focal motor/sensory deficits SKIN: no rashes or significant lesions LABORATORY DATA:  I have reviewed the data as listed    Component Value Date/Time   NA 134* 12/27/2015 0828   K 4.1 12/27/2015 0828   CL 103 12/27/2015 0828   CO2 27 12/27/2015 0828   GLUCOSE 127* 12/27/2015 0828   BUN 8 12/27/2015 0828   CREATININE 0.66 12/27/2015 0828   CALCIUM 9.2 12/27/2015 0828   PROT 7.6 12/27/2015 0828   ALBUMIN 3.0* 12/27/2015 0828   AST 17 12/27/2015 0828   ALT 10* 12/27/2015 0828   ALKPHOS 80 12/27/2015 0828   BILITOT 0.5 12/27/2015 0828   GFRNONAA >60 12/27/2015 0828   GFRAA >60 12/27/2015 0828    No results found for: SPEP, UPEP  Lab Results  Component Value Date   WBC 7.0 12/27/2015   NEUTROABS 5.7 12/27/2015   HGB 9.6* 12/27/2015   HCT 28.8* 12/27/2015   MCV 78.3* 12/27/2015   PLT 367 12/27/2015      Chemistry      Component Value Date/Time   NA 134* 12/27/2015 0828   K 4.1 12/27/2015 0828   CL 103 12/27/2015 0828   CO2 27 12/27/2015 0828   BUN 8 12/27/2015 0828   CREATININE 0.66 12/27/2015 0828      Component Value Date/Time   CALCIUM 9.2 12/27/2015 0828   ALKPHOS 80 12/27/2015 0828   AST 17 12/27/2015 0828   ALT 10* 12/27/2015 0828   BILITOT 0.5 12/27/2015 0828       ASSESSMENT & PLAN:   # Likely endometrial adenocarcinoma [?  Endocervical; given P 16]- stage IV. Status post cycle #2 of carbotaxol appx 3 weeks.  Patient tolerated chemotherapy with mild to moderate difficulties [C discussion below].Discussed that patient will get a CT scan after third cycle.  # Proceed with cycle #3 today. Labs unremarkable.  # Nausea vomiting/dehydration- question underlying malignancy/chemotherapy. Currently improved. Plan for IV fluids in approximately 10 days with next visit.  # Hyponatremia sodium today 132 improving.  # Poor appetite weight loss -improving. Gained 4 pounds.  # Follow-up in approximately 10 days/possible IV fluids. CAT scan at this time has been ordered to be done prior to cycle #  [place], MD 12/27/2015 9:09 AM

## 2016-01-06 ENCOUNTER — Inpatient Hospital Stay: Payer: Medicare Other

## 2016-01-06 ENCOUNTER — Inpatient Hospital Stay (HOSPITAL_BASED_OUTPATIENT_CLINIC_OR_DEPARTMENT_OTHER): Payer: Medicare Other | Admitting: Internal Medicine

## 2016-01-06 ENCOUNTER — Inpatient Hospital Stay: Payer: Medicare Other | Attending: Internal Medicine

## 2016-01-06 VITALS — BP 158/86 | HR 96 | Temp 97.3°F | Resp 18 | Wt 139.6 lb

## 2016-01-06 DIAGNOSIS — Z8041 Family history of malignant neoplasm of ovary: Secondary | ICD-10-CM | POA: Diagnosis not present

## 2016-01-06 DIAGNOSIS — Z79899 Other long term (current) drug therapy: Secondary | ICD-10-CM | POA: Diagnosis not present

## 2016-01-06 DIAGNOSIS — R112 Nausea with vomiting, unspecified: Secondary | ICD-10-CM | POA: Diagnosis not present

## 2016-01-06 DIAGNOSIS — M899 Disorder of bone, unspecified: Secondary | ICD-10-CM | POA: Insufficient documentation

## 2016-01-06 DIAGNOSIS — C7800 Secondary malignant neoplasm of unspecified lung: Secondary | ICD-10-CM | POA: Diagnosis not present

## 2016-01-06 DIAGNOSIS — Z803 Family history of malignant neoplasm of breast: Secondary | ICD-10-CM | POA: Insufficient documentation

## 2016-01-06 DIAGNOSIS — C530 Malignant neoplasm of endocervix: Secondary | ICD-10-CM | POA: Insufficient documentation

## 2016-01-06 DIAGNOSIS — Z5111 Encounter for antineoplastic chemotherapy: Secondary | ICD-10-CM | POA: Insufficient documentation

## 2016-01-06 DIAGNOSIS — C541 Malignant neoplasm of endometrium: Secondary | ICD-10-CM

## 2016-01-06 DIAGNOSIS — R599 Enlarged lymph nodes, unspecified: Secondary | ICD-10-CM | POA: Diagnosis not present

## 2016-01-06 DIAGNOSIS — Z85828 Personal history of other malignant neoplasm of skin: Secondary | ICD-10-CM | POA: Insufficient documentation

## 2016-01-06 DIAGNOSIS — G620 Drug-induced polyneuropathy: Secondary | ICD-10-CM | POA: Diagnosis not present

## 2016-01-06 DIAGNOSIS — E860 Dehydration: Secondary | ICD-10-CM

## 2016-01-06 DIAGNOSIS — R5383 Other fatigue: Secondary | ICD-10-CM | POA: Insufficient documentation

## 2016-01-06 DIAGNOSIS — Z7689 Persons encountering health services in other specified circumstances: Secondary | ICD-10-CM | POA: Diagnosis not present

## 2016-01-06 DIAGNOSIS — R634 Abnormal weight loss: Secondary | ICD-10-CM | POA: Insufficient documentation

## 2016-01-06 DIAGNOSIS — C779 Secondary and unspecified malignant neoplasm of lymph node, unspecified: Secondary | ICD-10-CM | POA: Diagnosis not present

## 2016-01-06 DIAGNOSIS — I10 Essential (primary) hypertension: Secondary | ICD-10-CM | POA: Diagnosis not present

## 2016-01-06 DIAGNOSIS — R531 Weakness: Secondary | ICD-10-CM | POA: Insufficient documentation

## 2016-01-06 DIAGNOSIS — Z7982 Long term (current) use of aspirin: Secondary | ICD-10-CM | POA: Insufficient documentation

## 2016-01-06 DIAGNOSIS — M791 Myalgia: Secondary | ICD-10-CM | POA: Diagnosis not present

## 2016-01-06 DIAGNOSIS — E871 Hypo-osmolality and hyponatremia: Secondary | ICD-10-CM | POA: Diagnosis not present

## 2016-01-06 DIAGNOSIS — F418 Other specified anxiety disorders: Secondary | ICD-10-CM

## 2016-01-06 DIAGNOSIS — K5900 Constipation, unspecified: Secondary | ICD-10-CM | POA: Diagnosis not present

## 2016-01-06 DIAGNOSIS — T451X5S Adverse effect of antineoplastic and immunosuppressive drugs, sequela: Secondary | ICD-10-CM | POA: Diagnosis not present

## 2016-01-06 DIAGNOSIS — R1900 Intra-abdominal and pelvic swelling, mass and lump, unspecified site: Secondary | ICD-10-CM | POA: Insufficient documentation

## 2016-01-06 LAB — CBC WITH DIFFERENTIAL/PLATELET
BASOS PCT: 0 %
Basophils Absolute: 0.1 10*3/uL (ref 0–0.1)
Eosinophils Absolute: 0 10*3/uL (ref 0–0.7)
Eosinophils Relative: 0 %
HEMATOCRIT: 28.3 % — AB (ref 35.0–47.0)
HEMOGLOBIN: 9.3 g/dL — AB (ref 12.0–16.0)
LYMPHS ABS: 1.4 10*3/uL (ref 1.0–3.6)
LYMPHS PCT: 8 %
MCH: 25.9 pg — AB (ref 26.0–34.0)
MCHC: 32.7 g/dL (ref 32.0–36.0)
MCV: 79.2 fL — AB (ref 80.0–100.0)
MONOS PCT: 4 %
Monocytes Absolute: 0.8 10*3/uL (ref 0.2–0.9)
Neutro Abs: 15.6 10*3/uL — ABNORMAL HIGH (ref 1.4–6.5)
Neutrophils Relative %: 88 %
PLATELETS: 340 10*3/uL (ref 150–440)
RBC: 3.57 MIL/uL — ABNORMAL LOW (ref 3.80–5.20)
RDW: 20.3 % — AB (ref 11.5–14.5)
WBC: 17.8 10*3/uL — ABNORMAL HIGH (ref 3.6–11.0)

## 2016-01-06 LAB — BASIC METABOLIC PANEL
Anion gap: 3 — ABNORMAL LOW (ref 5–15)
BUN: 10 mg/dL (ref 6–20)
CALCIUM: 9.2 mg/dL (ref 8.9–10.3)
CO2: 32 mmol/L (ref 22–32)
Chloride: 99 mmol/L — ABNORMAL LOW (ref 101–111)
Creatinine, Ser: 0.75 mg/dL (ref 0.44–1.00)
GFR calc Af Amer: >60 mL/min (ref >60)
GLUCOSE: 138 mg/dL — AB (ref 65–99)
Potassium: 3.4 mmol/L — ABNORMAL LOW (ref 3.5–5.1)
Sodium: 134 mmol/L — ABNORMAL LOW (ref 135–145)

## 2016-01-06 MED ORDER — PREDNISONE 20 MG PO TABS: ORAL_TABLET | ORAL | Status: DC

## 2016-01-06 MED ORDER — SODIUM CHLORIDE 0.9 % IV SOLN
INTRAVENOUS | Status: DC
  Administered 2016-01-06: 12:00:00 via INTRAVENOUS
  Filled 2016-01-06: qty 1000

## 2016-01-06 NOTE — Progress Notes (Signed)
[place] OFFICE PROGRESS NOTE  Patient Care Team: Petra Kuba, MD as PCP - General (Family Medicine) Clent Jacks, RN as Registered Nurse   SUMMARY OF ONCOLOGIC HISTORY:  # FEB 2017- STAGE IV ENDOMETRIAL ADENO CA [s/p Inguinal LN Bx; s/p endocervical Bx- pos; p16 pos? endocervical] Lymphadenopathy-RP/Ingiunal LN; CT-PET  bil pul nodules [sub-cm]/ MRI- pelvis; March 10th- Carbo-taxol q 3W  # L-1spinous process lytic lesion [on PET]- hold off X-geva sec to multiple issues  INTERVAL HISTORY:  70 year old female patient with with above history of metastatic endometrial cancer currently on first line chemotherapy with [place] Taxol except for follow-up. Patient currently status post 3 cycle of chemotherapy appx 10 days ago.  Patient complains of extreme fatigue. Complains of poor taste-poor appetite. She has lost weight. She just feels extremely weak.  No fever no chills. Intermittent constipation. No Tingling or numbness. No fevers.  Denies any back pain.  REVIEW OF SYSTEMS:  A complete 10 point review of system is done which is negative except mentioned above/history of present illness.   PAST MEDICAL HISTORY :  Past Medical History  Diagnosis Date  . Hypertension   . Carpal tunnel syndrome   . Lymphadenopathy 10/25/15  . Decreased appetite   . Weight loss     30 lb weight loss since October 2016  . Anxiety   . Depression   . Nausea & vomiting   . Endometrial cancer ([place]) 11/06/2015  . Chemotherapy-induced nausea and vomiting   . Decrease in appetite   . Weight loss     PAST SURGICAL HISTORY :   Past Surgical History  Procedure Laterality Date  . Carpal tunnel release Left   . Skin cancer excision    . Flexible sigmoidoscopy  05/2015    performed by Dr. Petra Kuba,    FAMILY HISTORY :   Family History  Problem Relation Age of Onset  . Breast cancer Sister   . Hypertension Sister   . Hypertension Brother   . Ovarian cancer Paternal  Aunt   . Diabetes Maternal Grandmother   . Breast cancer Daughter   . Breast cancer Cousin     maternal    SOCIAL HISTORY:   Social History  Substance Use Topics  . Smoking status: Never Smoker   . Smokeless tobacco: Never Used  . Alcohol Use: No    ALLERGIES:  is allergic to ibuprofen and tape.  MEDICATIONS:  Current Outpatient Prescriptions  Medication Sig Dispense Refill  . amLODipine (NORVASC) 10 MG tablet Take 1 tablet by mouth daily. Reported on 11/25/2015    . aspirin EC 81 MG tablet Take 81 mg by mouth daily. Reported on 11/25/2015    . dronabinol (MARINOL) 5 MG capsule Take 1 capsule (5 mg total) by mouth 2 (two) times daily before a meal. 60 capsule 1  . hydrochlorothiazide (HYDRODIURIL) 25 MG tablet Take 1 tablet by mouth daily. Reported on 11/25/2015    . HYDROcodone-acetaminophen (NORCO/VICODIN) 5-325 MG tablet Take 1 tablet by mouth every 6 (six) hours as needed for moderate pain. 40 tablet 0  . losartan (COZAAR) 100 MG tablet Take 1 tablet by mouth daily. Reported on 11/25/2015    . ondansetron (ZOFRAN) 8 MG tablet Start 3 days after chemo (Patient taking differently: Take 8 mg by mouth as needed for nausea or vomiting. Start 3 days after chemo) 40 tablet 0  . predniSONE (DELTASONE) 20 MG tablet Take 3 tablets (60 mg total) by mouth daily with  breakfast. x3 days; 2 pills x3 days; 1 pill x 3 days 30 tablet 0  . prochlorperazine (COMPAZINE) 10 MG tablet Take 1 tablet (10 mg total) by mouth every 6 (six) hours as needed for nausea or vomiting. 40 tablet 0  . promethazine (PHENERGAN) 25 MG tablet Take 1 tablet (25 mg total) by mouth every 6 (six) hours as needed for nausea or vomiting. 20 tablet 0   No current facility-administered medications for this visit.    PHYSICAL EXAMINATION: ECOG PERFORMANCE STATUS: 1 - Symptomatic but completely ambulatory  BP 158/86 mmHg  Pulse 96  Temp(Src) 97.3 F (36.3 C) (Tympanic)  Resp 18  Wt 139 lb 8.8 oz (63.3 kg)  Filed Weights    01/06/16 1019  Weight: 139 lb 8.8 oz (63.3 kg)   GENERAL: Well-nourished well-developed; Alert, no distress. EYES: no pallor or icterus OROPHARYNX: no thrush or ulceration; good dentition  NECK: supple, no masses felt LUNGS: clear to auscultation and No wheeze or crackles HEART/CVS: regular rate & rhythm and no murmurs; No lower extremity edema ABDOMEN: abdomen soft, non-tender and normal bowel sounds; approximately 2 cm lymph node felt in the left inguinal region./no Significant improvement Musculoskeletal:no cyanosis of digits and no clubbing  PSYCH: alert & oriented x 3 with fluent speech NEURO: no focal motor/sensory deficits SKIN: no rashes or significant lesions LABORATORY DATA:  I have reviewed the data as listed    Component Value Date/Time   NA 134* 01/06/2016 0913   K 3.4* 01/06/2016 0913   CL 99* 01/06/2016 0913   CO2 32 01/06/2016 0913   GLUCOSE 138* 01/06/2016 0913   BUN 10 01/06/2016 0913   CREATININE 0.75 01/06/2016 0913   CALCIUM 9.2 01/06/2016 0913   PROT 7.6 12/27/2015 0828   ALBUMIN 3.0* 12/27/2015 0828   AST 17 12/27/2015 0828   ALT 10* 12/27/2015 0828   ALKPHOS 80 12/27/2015 0828   BILITOT 0.5 12/27/2015 0828   GFRNONAA >60 01/06/2016 0913   GFRAA >60 01/06/2016 0913    No results found for: SPEP, UPEP  Lab Results  Component Value Date   WBC 17.8* 01/06/2016   NEUTROABS 15.6* 01/06/2016   HGB 9.3* 01/06/2016   HCT 28.3* 01/06/2016   MCV 79.2* 01/06/2016   PLT 340 01/06/2016      Chemistry      Component Value Date/Time   NA 134* 01/06/2016 0913   K 3.4* 01/06/2016 0913   CL 99* 01/06/2016 0913   CO2 32 01/06/2016 0913   BUN 10 01/06/2016 0913   CREATININE 0.75 01/06/2016 0913      Component Value Date/Time   CALCIUM 9.2 01/06/2016 0913   ALKPHOS 80 12/27/2015 0828   AST 17 12/27/2015 0828   ALT 10* 12/27/2015 0828   BILITOT 0.5 12/27/2015 0828       ASSESSMENT & PLAN:   # Likely endometrial adenocarcinoma [?  Endocervical; given P 16]- stage IV. Status post cycle #3 of carbotaxol appx 10 days.  Patient tolerated chemotherapy with mild to moderate difficulties [C discussion below]. Patient is due to get a CT scan chest abdomen pelvis next week/ prior to cycle #4.  # Fatigue/poor appetite- start predisone 20mg /d x10 days.   # Poor by mouth intake extreme fatigue/clinically suggestive of likely from chemotherapy Plan for IV fluids i today.    # Hyponatremia sodium today 132 improving.  # Poor appetite weight loss -again discussed with the patient; she continues to avoid dietary referral.  # Follow-up with me as planned/  with cycle #4. CT scan prior.     Cammie Sickle, MD 01/06/2016 11:18 AM

## 2016-01-06 NOTE — Progress Notes (Signed)
Patient states she has no appetite.  Tried to eat fruit yesterday but it gave her diarrhea.  Sometimes will eat a cracker.  Patient states she does not feel good at all.

## 2016-01-13 NOTE — Progress Notes (Unsigned)
Opened in error

## 2016-01-15 ENCOUNTER — Ambulatory Visit
Admission: RE | Admit: 2016-01-15 | Discharge: 2016-01-15 | Disposition: A | Discharge status: Home / Self Care | Payer: Medicare Other | Source: Ambulatory Visit | Attending: Internal Medicine | Admitting: Internal Medicine

## 2016-01-15 DIAGNOSIS — C541 Malignant neoplasm of endometrium: Secondary | ICD-10-CM | POA: Diagnosis present

## 2016-01-15 DIAGNOSIS — K8020 Calculus of gallbladder without cholecystitis without obstruction: Secondary | ICD-10-CM | POA: Insufficient documentation

## 2016-01-15 DIAGNOSIS — D121 Benign neoplasm of appendix: Secondary | ICD-10-CM | POA: Diagnosis not present

## 2016-01-15 DIAGNOSIS — I2510 Atherosclerotic heart disease of native coronary artery without angina pectoris: Secondary | ICD-10-CM | POA: Insufficient documentation

## 2016-01-15 DIAGNOSIS — C7982 Secondary malignant neoplasm of genital organs: Secondary | ICD-10-CM | POA: Diagnosis not present

## 2016-01-15 DIAGNOSIS — C7800 Secondary malignant neoplasm of unspecified lung: Secondary | ICD-10-CM | POA: Insufficient documentation

## 2016-01-15 MED ORDER — IOPAMIDOL (ISOVUE-300) INJECTION 61%
85.0000 mL | Freq: Once | INTRAVENOUS | Status: AC | PRN
  Administered 2016-01-15: 85 mL via INTRAVENOUS

## 2016-01-16 ENCOUNTER — Other Ambulatory Visit: Payer: Self-pay | Admitting: Internal Medicine

## 2016-01-16 DIAGNOSIS — C539 Malignant neoplasm of cervix uteri, unspecified: Secondary | ICD-10-CM | POA: Insufficient documentation

## 2016-01-17 ENCOUNTER — Inpatient Hospital Stay: Payer: Medicare Other

## 2016-01-17 ENCOUNTER — Inpatient Hospital Stay (HOSPITAL_BASED_OUTPATIENT_CLINIC_OR_DEPARTMENT_OTHER): Payer: Medicare Other | Admitting: Internal Medicine

## 2016-01-17 VITALS — BP 145/89 | HR 99 | Temp 95.6°F | Resp 18 | Wt 140.4 lb

## 2016-01-17 DIAGNOSIS — C779 Secondary and unspecified malignant neoplasm of lymph node, unspecified: Secondary | ICD-10-CM

## 2016-01-17 DIAGNOSIS — C541 Malignant neoplasm of endometrium: Secondary | ICD-10-CM | POA: Diagnosis not present

## 2016-01-17 DIAGNOSIS — M899 Disorder of bone, unspecified: Secondary | ICD-10-CM

## 2016-01-17 DIAGNOSIS — Z5111 Encounter for antineoplastic chemotherapy: Secondary | ICD-10-CM | POA: Diagnosis not present

## 2016-01-17 DIAGNOSIS — C7800 Secondary malignant neoplasm of unspecified lung: Secondary | ICD-10-CM | POA: Diagnosis not present

## 2016-01-17 DIAGNOSIS — E871 Hypo-osmolality and hyponatremia: Secondary | ICD-10-CM | POA: Diagnosis not present

## 2016-01-17 DIAGNOSIS — K5900 Constipation, unspecified: Secondary | ICD-10-CM

## 2016-01-17 DIAGNOSIS — R1900 Intra-abdominal and pelvic swelling, mass and lump, unspecified site: Secondary | ICD-10-CM

## 2016-01-17 DIAGNOSIS — Z803 Family history of malignant neoplasm of breast: Secondary | ICD-10-CM

## 2016-01-17 DIAGNOSIS — I10 Essential (primary) hypertension: Secondary | ICD-10-CM

## 2016-01-17 DIAGNOSIS — Z8041 Family history of malignant neoplasm of ovary: Secondary | ICD-10-CM

## 2016-01-17 DIAGNOSIS — R112 Nausea with vomiting, unspecified: Secondary | ICD-10-CM

## 2016-01-17 DIAGNOSIS — R634 Abnormal weight loss: Secondary | ICD-10-CM

## 2016-01-17 DIAGNOSIS — R599 Enlarged lymph nodes, unspecified: Secondary | ICD-10-CM

## 2016-01-17 DIAGNOSIS — R5383 Other fatigue: Secondary | ICD-10-CM

## 2016-01-17 DIAGNOSIS — Z85828 Personal history of other malignant neoplasm of skin: Secondary | ICD-10-CM

## 2016-01-17 DIAGNOSIS — Z7982 Long term (current) use of aspirin: Secondary | ICD-10-CM

## 2016-01-17 DIAGNOSIS — F418 Other specified anxiety disorders: Secondary | ICD-10-CM

## 2016-01-17 DIAGNOSIS — R531 Weakness: Secondary | ICD-10-CM

## 2016-01-17 DIAGNOSIS — Z79899 Other long term (current) drug therapy: Secondary | ICD-10-CM

## 2016-01-17 LAB — COMPREHENSIVE METABOLIC PANEL
ALK PHOS: 83 U/L (ref 38–126)
ALT: 9 U/L — AB (ref 14–54)
AST: 18 U/L (ref 15–41)
Albumin: 3.1 g/dL — ABNORMAL LOW (ref 3.5–5.0)
Anion gap: 8 (ref 5–15)
BILIRUBIN TOTAL: 0.5 mg/dL (ref 0.3–1.2)
BUN: 8 mg/dL (ref 6–20)
CALCIUM: 9.3 mg/dL (ref 8.9–10.3)
CO2: 25 mmol/L (ref 22–32)
CREATININE: 0.69 mg/dL (ref 0.44–1.00)
Chloride: 100 mmol/L — ABNORMAL LOW (ref 101–111)
GFR calc non Af Amer: >60 mL/min (ref >60)
GLUCOSE: 145 mg/dL — AB (ref 65–99)
Potassium: 3.9 mmol/L (ref 3.5–5.1)
SODIUM: 133 mmol/L — AB (ref 135–145)
TOTAL PROTEIN: 8.2 g/dL — AB (ref 6.5–8.1)

## 2016-01-17 LAB — CBC WITH DIFFERENTIAL/PLATELET
Basophils Absolute: 0 10*3/uL (ref 0–0.1)
Basophils Relative: 1 %
EOS ABS: 0 10*3/uL (ref 0–0.7)
Eosinophils Relative: 0 %
HEMATOCRIT: 29.9 % — AB (ref 35.0–47.0)
HEMOGLOBIN: 9.8 g/dL — AB (ref 12.0–16.0)
LYMPHS ABS: 0.8 10*3/uL — AB (ref 1.0–3.6)
LYMPHS PCT: 10 %
MCH: 26.6 pg (ref 26.0–34.0)
MCHC: 32.8 g/dL (ref 32.0–36.0)
MCV: 81.2 fL (ref 80.0–100.0)
MONOS PCT: 8 %
Monocytes Absolute: 0.6 10*3/uL (ref 0.2–0.9)
NEUTROS ABS: 6.3 10*3/uL (ref 1.4–6.5)
NEUTROS PCT: 81 %
Platelets: 299 10*3/uL (ref 150–440)
RBC: 3.68 MIL/uL — AB (ref 3.80–5.20)
RDW: 22 % — ABNORMAL HIGH (ref 11.5–14.5)
WBC: 7.7 10*3/uL (ref 3.6–11.0)

## 2016-01-17 NOTE — Progress Notes (Signed)
Patient here today for CT results. 
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
## 2016-01-20 ENCOUNTER — Other Ambulatory Visit: Payer: Self-pay | Admitting: Internal Medicine

## 2016-01-20 ENCOUNTER — Inpatient Hospital Stay: Payer: Medicare Other

## 2016-01-20 ENCOUNTER — Other Ambulatory Visit: Payer: Self-pay

## 2016-01-20 ENCOUNTER — Other Ambulatory Visit: Payer: Self-pay | Admitting: *Deleted

## 2016-01-20 VITALS — BP 153/79 | HR 105

## 2016-01-20 DIAGNOSIS — C539 Malignant neoplasm of cervix uteri, unspecified: Secondary | ICD-10-CM

## 2016-01-20 DIAGNOSIS — Z5111 Encounter for antineoplastic chemotherapy: Secondary | ICD-10-CM | POA: Diagnosis not present

## 2016-01-20 DIAGNOSIS — C541 Malignant neoplasm of endometrium: Secondary | ICD-10-CM

## 2016-01-20 LAB — PROTEIN, URINE, RANDOM: TOTAL PROTEIN, URINE: 13 mg/dL

## 2016-01-20 MED ORDER — DIPHENHYDRAMINE HCL 50 MG/ML IJ SOLN
50.0000 mg | Freq: Once | INTRAMUSCULAR | Status: AC
  Administered 2016-01-20: 50 mg via INTRAVENOUS
  Filled 2016-01-20: qty 1

## 2016-01-20 MED ORDER — FAMOTIDINE IN NACL 20-0.9 MG/50ML-% IV SOLN
20.0000 mg | Freq: Two times a day (BID) | INTRAVENOUS | Status: DC
  Administered 2016-01-20: 20 mg via INTRAVENOUS
  Filled 2016-01-20: qty 50

## 2016-01-20 MED ORDER — SODIUM CHLORIDE 0.9 % IV SOLN
20.0000 mg | Freq: Once | INTRAVENOUS | Status: AC
  Administered 2016-01-20: 20 mg via INTRAVENOUS
  Filled 2016-01-20: qty 2

## 2016-01-20 MED ORDER — PACLITAXEL CHEMO INJECTION 300 MG/50ML: 175.0000 mg/m2 | Freq: Once | INTRAVENOUS | Status: DC

## 2016-01-20 MED ORDER — SODIUM CHLORIDE 0.9 % IV SOLN
312.0000 mg | Freq: Once | INTRAVENOUS | Status: AC
  Administered 2016-01-20: 312 mg via INTRAVENOUS
  Filled 2016-01-20: qty 52

## 2016-01-20 MED ORDER — PACLITAXEL CHEMO INJECTION 300 MG/50ML: 180.0000 mg/m2 | Freq: Once | INTRAVENOUS | Status: DC

## 2016-01-20 MED ORDER — SODIUM CHLORIDE 0.9 % IV SOLN
Freq: Once | INTRAVENOUS | Status: AC
  Administered 2016-01-20: 10:00:00 via INTRAVENOUS
  Filled 2016-01-20: qty 1000

## 2016-01-20 MED ORDER — TOPOTECAN HCL CHEMO INJECTION 4 MG
0.7500 mg/m2 | Freq: Once | INTRAVENOUS | Status: AC
  Administered 2016-01-20: 1.3 mg via INTRAVENOUS
  Filled 2016-01-20: qty 1.3

## 2016-01-20 MED ORDER — PROCHLORPERAZINE MALEATE 10 MG PO TABS
10.0000 mg | ORAL_TABLET | Freq: Once | ORAL | Status: AC
Start: 2016-01-20 — End: 2016-01-20
  Administered 2016-01-20: 10 mg via ORAL
  Filled 2016-01-20: qty 1

## 2016-01-20 MED ORDER — SODIUM CHLORIDE 0.9 % IV SOLN
15.0000 mg/kg | Freq: Once | INTRAVENOUS | Status: AC
  Administered 2016-01-20: 950 mg via INTRAVENOUS
  Filled 2016-01-20: qty 32

## 2016-01-21 ENCOUNTER — Inpatient Hospital Stay: Payer: Medicare Other

## 2016-01-21 VITALS — BP 158/82 | HR 80 | Temp 97.7°F

## 2016-01-21 DIAGNOSIS — C541 Malignant neoplasm of endometrium: Secondary | ICD-10-CM

## 2016-01-21 DIAGNOSIS — C539 Malignant neoplasm of cervix uteri, unspecified: Secondary | ICD-10-CM

## 2016-01-21 DIAGNOSIS — Z5111 Encounter for antineoplastic chemotherapy: Secondary | ICD-10-CM | POA: Diagnosis not present

## 2016-01-21 MED ORDER — TOPOTECAN HCL CHEMO INJECTION 4 MG
0.7500 mg/m2 | Freq: Once | INTRAVENOUS | Status: AC
  Administered 2016-01-21: 1.3 mg via INTRAVENOUS
  Filled 2016-01-21: qty 1.3

## 2016-01-21 MED ORDER — SODIUM CHLORIDE 0.9 % IV SOLN
Freq: Once | INTRAVENOUS | Status: AC
  Administered 2016-01-21: 14:00:00 via INTRAVENOUS
  Filled 2016-01-21: qty 1000

## 2016-01-21 MED ORDER — PROCHLORPERAZINE MALEATE 10 MG PO TABS
10.0000 mg | ORAL_TABLET | Freq: Once | ORAL | Status: AC
  Administered 2016-01-21: 10 mg via ORAL
  Filled 2016-01-21: qty 1

## 2016-01-22 ENCOUNTER — Inpatient Hospital Stay: Payer: Medicare Other

## 2016-01-22 VITALS — BP 154/96 | HR 80 | Temp 97.7°F

## 2016-01-22 DIAGNOSIS — C539 Malignant neoplasm of cervix uteri, unspecified: Secondary | ICD-10-CM

## 2016-01-22 DIAGNOSIS — Z5111 Encounter for antineoplastic chemotherapy: Secondary | ICD-10-CM | POA: Diagnosis not present

## 2016-01-22 DIAGNOSIS — C541 Malignant neoplasm of endometrium: Secondary | ICD-10-CM

## 2016-01-22 MED ORDER — TOPOTECAN HCL CHEMO INJECTION 4 MG
0.7500 mg/m2 | Freq: Once | INTRAVENOUS | Status: AC
  Administered 2016-01-22: 1.3 mg via INTRAVENOUS
  Filled 2016-01-22: qty 1.3

## 2016-01-22 MED ORDER — PEGFILGRASTIM 6 MG/0.6ML ~~LOC~~ PSKT
6.0000 mg | PREFILLED_SYRINGE | Freq: Once | SUBCUTANEOUS | Status: AC
  Administered 2016-01-22: 6 mg via SUBCUTANEOUS
  Filled 2016-01-22: qty 0.6

## 2016-01-22 MED ORDER — PROCHLORPERAZINE MALEATE 10 MG PO TABS
10.0000 mg | ORAL_TABLET | Freq: Once | ORAL | Status: AC
  Administered 2016-01-22: 10 mg via ORAL
  Filled 2016-01-22: qty 1

## 2016-01-22 MED ORDER — SODIUM CHLORIDE 0.9 % IV SOLN
Freq: Once | INTRAVENOUS | Status: AC
  Administered 2016-01-22: 14:00:00 via INTRAVENOUS
  Filled 2016-01-22: qty 1000

## 2016-01-31 ENCOUNTER — Inpatient Hospital Stay: Payer: Medicare Other

## 2016-01-31 ENCOUNTER — Inpatient Hospital Stay (HOSPITAL_BASED_OUTPATIENT_CLINIC_OR_DEPARTMENT_OTHER): Payer: Medicare Other | Admitting: Internal Medicine

## 2016-01-31 VITALS — BP 158/98 | HR 79 | Temp 97.7°F | Resp 18 | Wt 138.7 lb

## 2016-01-31 DIAGNOSIS — Z8041 Family history of malignant neoplasm of ovary: Secondary | ICD-10-CM

## 2016-01-31 DIAGNOSIS — F418 Other specified anxiety disorders: Secondary | ICD-10-CM

## 2016-01-31 DIAGNOSIS — R112 Nausea with vomiting, unspecified: Secondary | ICD-10-CM

## 2016-01-31 DIAGNOSIS — Z803 Family history of malignant neoplasm of breast: Secondary | ICD-10-CM

## 2016-01-31 DIAGNOSIS — K5900 Constipation, unspecified: Secondary | ICD-10-CM

## 2016-01-31 DIAGNOSIS — R634 Abnormal weight loss: Secondary | ICD-10-CM

## 2016-01-31 DIAGNOSIS — C7800 Secondary malignant neoplasm of unspecified lung: Secondary | ICD-10-CM

## 2016-01-31 DIAGNOSIS — Z7982 Long term (current) use of aspirin: Secondary | ICD-10-CM

## 2016-01-31 DIAGNOSIS — Z85828 Personal history of other malignant neoplasm of skin: Secondary | ICD-10-CM

## 2016-01-31 DIAGNOSIS — Z5111 Encounter for antineoplastic chemotherapy: Secondary | ICD-10-CM | POA: Diagnosis not present

## 2016-01-31 DIAGNOSIS — T451X5S Adverse effect of antineoplastic and immunosuppressive drugs, sequela: Secondary | ICD-10-CM

## 2016-01-31 DIAGNOSIS — Z7689 Persons encountering health services in other specified circumstances: Secondary | ICD-10-CM | POA: Diagnosis not present

## 2016-01-31 DIAGNOSIS — R531 Weakness: Secondary | ICD-10-CM

## 2016-01-31 DIAGNOSIS — Z79899 Other long term (current) drug therapy: Secondary | ICD-10-CM

## 2016-01-31 DIAGNOSIS — M899 Disorder of bone, unspecified: Secondary | ICD-10-CM

## 2016-01-31 DIAGNOSIS — C530 Malignant neoplasm of endocervix: Secondary | ICD-10-CM | POA: Diagnosis not present

## 2016-01-31 DIAGNOSIS — C541 Malignant neoplasm of endometrium: Secondary | ICD-10-CM

## 2016-01-31 DIAGNOSIS — C779 Secondary and unspecified malignant neoplasm of lymph node, unspecified: Secondary | ICD-10-CM

## 2016-01-31 DIAGNOSIS — G620 Drug-induced polyneuropathy: Secondary | ICD-10-CM | POA: Diagnosis not present

## 2016-01-31 DIAGNOSIS — R5383 Other fatigue: Secondary | ICD-10-CM

## 2016-01-31 DIAGNOSIS — I10 Essential (primary) hypertension: Secondary | ICD-10-CM

## 2016-01-31 DIAGNOSIS — R599 Enlarged lymph nodes, unspecified: Secondary | ICD-10-CM

## 2016-01-31 DIAGNOSIS — R1900 Intra-abdominal and pelvic swelling, mass and lump, unspecified site: Secondary | ICD-10-CM

## 2016-01-31 DIAGNOSIS — M791 Myalgia: Secondary | ICD-10-CM

## 2016-01-31 LAB — COMPREHENSIVE METABOLIC PANEL
ALBUMIN: 3.2 g/dL — AB (ref 3.5–5.0)
ALK PHOS: 95 U/L (ref 38–126)
ALT: 10 U/L — AB (ref 14–54)
AST: 21 U/L (ref 15–41)
Anion gap: 6 (ref 5–15)
BUN: 8 mg/dL (ref 6–20)
CALCIUM: 9 mg/dL (ref 8.9–10.3)
CHLORIDE: 104 mmol/L (ref 101–111)
CO2: 25 mmol/L (ref 22–32)
CREATININE: 0.84 mg/dL (ref 0.44–1.00)
GFR calc Af Amer: >60 mL/min (ref >60)
GFR calc non Af Amer: >60 mL/min (ref >60)
GLUCOSE: 126 mg/dL — AB (ref 65–99)
Potassium: 3.6 mmol/L (ref 3.5–5.1)
SODIUM: 135 mmol/L (ref 135–145)
Total Bilirubin: 0.4 mg/dL (ref 0.3–1.2)
Total Protein: 7.8 g/dL (ref 6.5–8.1)

## 2016-01-31 LAB — CBC WITH DIFFERENTIAL/PLATELET
BASOS ABS: 0 10*3/uL (ref 0–0.1)
BASOS PCT: 1 %
EOS ABS: 0 10*3/uL (ref 0–0.7)
Eosinophils Relative: 0 %
HCT: 27.7 % — ABNORMAL LOW (ref 35.0–47.0)
HEMOGLOBIN: 9.2 g/dL — AB (ref 12.0–16.0)
LYMPHS ABS: 1.1 10*3/uL (ref 1.0–3.6)
LYMPHS PCT: 14 %
MCH: 27.6 pg (ref 26.0–34.0)
MCHC: 33.2 g/dL (ref 32.0–36.0)
MCV: 83.1 fL (ref 80.0–100.0)
MONO ABS: 0.4 10*3/uL (ref 0.2–0.9)
MONOS PCT: 5 %
NEUTROS ABS: 6.4 10*3/uL (ref 1.4–6.5)
Neutrophils Relative %: 80 %
PLATELETS: 131 10*3/uL — AB (ref 150–440)
RBC: 3.33 MIL/uL — AB (ref 3.80–5.20)
RDW: 20.4 % — ABNORMAL HIGH (ref 11.5–14.5)
WBC: 8 10*3/uL (ref 3.6–11.0)

## 2016-01-31 NOTE — Patient Instructions (Signed)
You may start taking the hydrochlorothiazide and daily Asprin tablet

## 2016-01-31 NOTE — Progress Notes (Signed)
[place] OFFICE PROGRESS NOTE  Patient Care Team: Petra Kuba, MD as PCP - General (Family Medicine) Clent Jacks, RN as Registered Nurse   SUMMARY OF ONCOLOGIC HISTORY:  # FEB 2017- STAGE IV ENDOCERVICAL ADENO CA vs Endometrial Adeno ca [s/p Left Inguinal LN Bx; s/p endocervical Bx- pos; p16 pos] Lymphadenopathy-RP/Ingiunal LN; CT-PET  bil pul nodules [sub-cm]/ MRI- pelvis; March 10th- Carbo-taxol q 3W x3 cycles  # MAY 10th CT C/A/P-  PROGRESSION - mild increase in size/number of lung nodules/RP adenopathy/bil adenexal masses/ stable endometrial mass. MAY 15th START TOPOTECAN- TAXOL-AVASTIN  # L-1spinous process lytic lesion [on PET]- hold off X-geva sec to multiple issues  INTERVAL HISTORY:  70 year old female patient with with above history of metastatic endocervical cancer currently second line therapy with Avastin topotecan Taxol approximately 10 days ago is here for follow-up.  She complains of body aches/muscle aches cramping in the legs for Few days after days chemotherapy. Mild nausea no vomiting. Appetite is fair. She is on prednisone 10 mg. No fever no chills. Intermittent constipation. No fevers.  Denies any back pain.   She has mild numbness in her legs which is currently improved.  No falls.   REVIEW OF SYSTEMS:  A complete 10 point review of system is done which is negative except mentioned above/history of present illness.   PAST MEDICAL HISTORY :  Past Medical History  Diagnosis Date  . Hypertension   . Carpal tunnel syndrome   . Lymphadenopathy 10/25/15  . Decreased appetite   . Weight loss     30 lb weight loss since October 2016  . Anxiety   . Depression   . Nausea & vomiting   . Chemotherapy-induced nausea and vomiting   . Decrease in appetite   . Weight loss   . Endometrial cancer ([place]) 11/06/2015    PAST SURGICAL HISTORY :   Past Surgical History  Procedure Laterality Date  . Carpal tunnel release Left   . Skin cancer  excision    . Flexible sigmoidoscopy  05/2015    performed by Dr. Petra Kuba,    FAMILY HISTORY :   Family History  Problem Relation Age of Onset  . Breast cancer Sister   . Hypertension Sister   . Hypertension Brother   . Ovarian cancer Paternal Aunt   . Diabetes Maternal Grandmother   . Breast cancer Daughter   . Breast cancer Cousin     maternal    SOCIAL HISTORY:   Social History  Substance Use Topics  . Smoking status: Never Smoker   . Smokeless tobacco: Never Used  . Alcohol Use: No    ALLERGIES:  is allergic to ibuprofen and tape.  MEDICATIONS:  Current Outpatient Prescriptions  Medication Sig Dispense Refill  . amLODipine (NORVASC) 10 MG tablet Take 1 tablet by mouth daily. Reported on 11/25/2015    . aspirin EC 81 MG tablet Take 81 mg by mouth daily. Reported on 11/25/2015    . dronabinol (MARINOL) 5 MG capsule Take 1 capsule (5 mg total) by mouth 2 (two) times daily before a meal. 60 capsule 1  . hydrochlorothiazide (HYDRODIURIL) 25 MG tablet Take 1 tablet by mouth daily. Reported on 11/25/2015    . HYDROcodone-acetaminophen (NORCO/VICODIN) 5-325 MG tablet Take 1 tablet by mouth every 6 (six) hours as needed for moderate pain. 40 tablet 0  . losartan (COZAAR) 100 MG tablet Take 1 tablet by mouth daily. Reported on 11/25/2015    . ondansetron (  ZOFRAN) 8 MG tablet Start 3 days after chemo (Patient taking differently: Take 8 mg by mouth as needed for nausea or vomiting. Start 3 days after chemo) 40 tablet 0  . predniSONE (DELTASONE) 20 MG tablet One pill once a day x10 days. 10 tablet 0  . prochlorperazine (COMPAZINE) 10 MG tablet Take 1 tablet (10 mg total) by mouth every 6 (six) hours as needed for nausea or vomiting. 40 tablet 0  . promethazine (PHENERGAN) 25 MG tablet Take 1 tablet (25 mg total) by mouth every 6 (six) hours as needed for nausea or vomiting. 20 tablet 0   No current facility-administered medications for this visit.    PHYSICAL  EXAMINATION: ECOG PERFORMANCE STATUS: 1 - Symptomatic but completely ambulatory  BP 158/98 mmHg  Pulse 79  Temp(Src) 97.7 F (36.5 C) (Tympanic)  Resp 18  Wt 138 lb 10.7 oz (62.9 kg)  Filed Weights   01/31/16 1001  Weight: 138 lb 10.7 oz (62.9 kg)   GENERAL: Well-nourished well-developed; Alert, no distress. EYES: no pallor or icterus; Accompanied by husband. OROPHARYNX: no thrush or ulceration; good dentition  NECK: supple, no masses felt LUNGS: clear to auscultation and No wheeze or crackles HEART/CVS: regular rate & rhythm and no murmurs; No lower extremity edema ABDOMEN: abdomen soft, non-tender and normal bowel sounds; approximately 2 cm lymph node felt in the left inguinal region./no Significant improvement Musculoskeletal:no cyanosis of digits and no clubbing  PSYCH: alert & oriented x 3 with fluent speech NEURO: no focal motor/sensory deficits SKIN: no rashes or significant lesions LABORATORY DATA:  I have reviewed the data as listed    Component Value Date/Time   NA 135 01/31/2016 0912   K 3.6 01/31/2016 0912   CL 104 01/31/2016 0912   CO2 25 01/31/2016 0912   GLUCOSE 126* 01/31/2016 0912   BUN 8 01/31/2016 0912   CREATININE 0.84 01/31/2016 0912   CALCIUM 9.0 01/31/2016 0912   PROT 7.8 01/31/2016 0912   ALBUMIN 3.2* 01/31/2016 0912   AST 21 01/31/2016 0912   ALT 10* 01/31/2016 0912   ALKPHOS 95 01/31/2016 0912   BILITOT 0.4 01/31/2016 0912   GFRNONAA >60 01/31/2016 0912   GFRAA >60 01/31/2016 0912    No results found for: SPEP, UPEP  Lab Results  Component Value Date   WBC 8.0 01/31/2016   NEUTROABS 6.4 01/31/2016   HGB 9.2* 01/31/2016   HCT 27.7* 01/31/2016   MCV 83.1 01/31/2016   PLT 131* 01/31/2016      Chemistry      Component Value Date/Time   NA 135 01/31/2016 0912   K 3.6 01/31/2016 0912   CL 104 01/31/2016 0912   CO2 25 01/31/2016 0912   BUN 8 01/31/2016 0912   CREATININE 0.84 01/31/2016 0912      Component Value Date/Time    CALCIUM 9.0 01/31/2016 0912   ALKPHOS 95 01/31/2016 0912   AST 21 01/31/2016 0912   ALT 10* 01/31/2016 0912   BILITOT 0.4 01/31/2016 0912       ASSESSMENT & PLAN:   # Likely Endocervical adeno ca [? high grade endometrial adenocarcinoma]  stage IV- platinum and Taxol refractory currently on second line therapy- status post cycle #1 of Taxol-topotecan-Avastin approximately 10 days ago.  # Seems to have tolerated chemotherapy fairly well except for fatigue.   # Myalgias/body aches- Taxol versus Neulasta. Recommend continued hydrocodone as needed.  # Elevated Blood pressure- recommend restarting back on hydrochlorothiazide. Patient is already on Norvasc and Cozaar. This  has to be monitored closely especially when Avastin.  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 Monitor for now.  # Follow-up as planned     Cammie Sickle, MD 01/31/2016 10:21 AM.  And had a minor

## 2016-02-10 ENCOUNTER — Inpatient Hospital Stay (HOSPITAL_BASED_OUTPATIENT_CLINIC_OR_DEPARTMENT_OTHER): Payer: Medicare Other | Admitting: Internal Medicine

## 2016-02-10 ENCOUNTER — Inpatient Hospital Stay: Payer: Medicare Other

## 2016-02-10 ENCOUNTER — Inpatient Hospital Stay: Payer: Medicare Other | Attending: Internal Medicine

## 2016-02-10 VITALS — BP 144/86 | HR 18 | Temp 97.8°F | Resp 18 | Wt 139.6 lb

## 2016-02-10 DIAGNOSIS — Z79899 Other long term (current) drug therapy: Secondary | ICD-10-CM | POA: Insufficient documentation

## 2016-02-10 DIAGNOSIS — Z803 Family history of malignant neoplasm of breast: Secondary | ICD-10-CM

## 2016-02-10 DIAGNOSIS — R1900 Intra-abdominal and pelvic swelling, mass and lump, unspecified site: Secondary | ICD-10-CM | POA: Diagnosis not present

## 2016-02-10 DIAGNOSIS — Z7951 Long term (current) use of inhaled steroids: Secondary | ICD-10-CM

## 2016-02-10 DIAGNOSIS — Z5111 Encounter for antineoplastic chemotherapy: Secondary | ICD-10-CM | POA: Insufficient documentation

## 2016-02-10 DIAGNOSIS — C530 Malignant neoplasm of endocervix: Secondary | ICD-10-CM | POA: Insufficient documentation

## 2016-02-10 DIAGNOSIS — M899 Disorder of bone, unspecified: Secondary | ICD-10-CM | POA: Diagnosis not present

## 2016-02-10 DIAGNOSIS — Z7982 Long term (current) use of aspirin: Secondary | ICD-10-CM | POA: Diagnosis not present

## 2016-02-10 DIAGNOSIS — R21 Rash and other nonspecific skin eruption: Secondary | ICD-10-CM

## 2016-02-10 DIAGNOSIS — Z7689 Persons encountering health services in other specified circumstances: Secondary | ICD-10-CM | POA: Insufficient documentation

## 2016-02-10 DIAGNOSIS — R634 Abnormal weight loss: Secondary | ICD-10-CM | POA: Diagnosis not present

## 2016-02-10 DIAGNOSIS — F418 Other specified anxiety disorders: Secondary | ICD-10-CM | POA: Diagnosis not present

## 2016-02-10 DIAGNOSIS — R591 Generalized enlarged lymph nodes: Secondary | ICD-10-CM

## 2016-02-10 DIAGNOSIS — C539 Malignant neoplasm of cervix uteri, unspecified: Secondary | ICD-10-CM

## 2016-02-10 DIAGNOSIS — Z8041 Family history of malignant neoplasm of ovary: Secondary | ICD-10-CM | POA: Insufficient documentation

## 2016-02-10 DIAGNOSIS — T451X5S Adverse effect of antineoplastic and immunosuppressive drugs, sequela: Secondary | ICD-10-CM | POA: Insufficient documentation

## 2016-02-10 DIAGNOSIS — R918 Other nonspecific abnormal finding of lung field: Secondary | ICD-10-CM

## 2016-02-10 DIAGNOSIS — R630 Anorexia: Secondary | ICD-10-CM

## 2016-02-10 DIAGNOSIS — I10 Essential (primary) hypertension: Secondary | ICD-10-CM | POA: Insufficient documentation

## 2016-02-10 DIAGNOSIS — G629 Polyneuropathy, unspecified: Secondary | ICD-10-CM | POA: Insufficient documentation

## 2016-02-10 DIAGNOSIS — C541 Malignant neoplasm of endometrium: Secondary | ICD-10-CM

## 2016-02-10 DIAGNOSIS — R112 Nausea with vomiting, unspecified: Secondary | ICD-10-CM | POA: Diagnosis not present

## 2016-02-10 DIAGNOSIS — R200 Anesthesia of skin: Secondary | ICD-10-CM | POA: Insufficient documentation

## 2016-02-10 LAB — CBC WITH DIFFERENTIAL/PLATELET
BASOS ABS: 0 10*3/uL (ref 0–0.1)
Basophils Relative: 1 %
EOS ABS: 0 10*3/uL (ref 0–0.7)
EOS PCT: 0 %
HCT: 30 % — ABNORMAL LOW (ref 35.0–47.0)
Hemoglobin: 10 g/dL — ABNORMAL LOW (ref 12.0–16.0)
LYMPHS ABS: 1.2 10*3/uL (ref 1.0–3.6)
Lymphocytes Relative: 14 %
MCH: 28.5 pg (ref 26.0–34.0)
MCHC: 33.4 g/dL (ref 32.0–36.0)
MCV: 85.3 fL (ref 80.0–100.0)
Monocytes Absolute: 0.8 10*3/uL (ref 0.2–0.9)
Monocytes Relative: 9 %
Neutro Abs: 6.5 10*3/uL (ref 1.4–6.5)
Neutrophils Relative %: 76 %
PLATELETS: 484 10*3/uL — AB (ref 150–440)
RBC: 3.51 MIL/uL — AB (ref 3.80–5.20)
RDW: 21.2 % — ABNORMAL HIGH (ref 11.5–14.5)
WBC: 8.5 10*3/uL (ref 3.6–11.0)

## 2016-02-10 LAB — COMPREHENSIVE METABOLIC PANEL
ALT: 8 U/L — AB (ref 14–54)
AST: 19 U/L (ref 15–41)
Albumin: 3.2 g/dL — ABNORMAL LOW (ref 3.5–5.0)
Alkaline Phosphatase: 90 U/L (ref 38–126)
Anion gap: 8 (ref 5–15)
BUN: 12 mg/dL (ref 6–20)
CHLORIDE: 102 mmol/L (ref 101–111)
CO2: 25 mmol/L (ref 22–32)
CREATININE: 0.75 mg/dL (ref 0.44–1.00)
Calcium: 9.5 mg/dL (ref 8.9–10.3)
GFR calc non Af Amer: >60 mL/min (ref >60)
Glucose, Bld: 143 mg/dL — ABNORMAL HIGH (ref 65–99)
Potassium: 3.6 mmol/L (ref 3.5–5.1)
Sodium: 135 mmol/L (ref 135–145)
Total Bilirubin: 0.3 mg/dL (ref 0.3–1.2)
Total Protein: 7.9 g/dL (ref 6.5–8.1)

## 2016-02-10 MED ORDER — TOPOTECAN HCL CHEMO INJECTION 4 MG
0.7500 mg/m2 | Freq: Once | INTRAVENOUS | Status: AC
  Administered 2016-02-10: 1.3 mg via INTRAVENOUS
  Filled 2016-02-10: qty 1.3

## 2016-02-10 MED ORDER — PREDNISONE 20 MG PO TABS: ORAL_TABLET | ORAL | Status: DC

## 2016-02-10 MED ORDER — FAMOTIDINE IN NACL 20-0.9 MG/50ML-% IV SOLN
20.0000 mg | Freq: Two times a day (BID) | INTRAVENOUS | Status: DC
  Administered 2016-02-10: 20 mg via INTRAVENOUS
  Filled 2016-02-10: qty 50

## 2016-02-10 MED ORDER — SODIUM CHLORIDE 0.9 % IV SOLN
Freq: Once | INTRAVENOUS | Status: AC
  Filled 2016-02-10: qty 1000

## 2016-02-10 MED ORDER — DIPHENHYDRAMINE HCL 50 MG/ML IJ SOLN
50.0000 mg | Freq: Once | INTRAMUSCULAR | Status: AC
  Administered 2016-02-10: 50 mg via INTRAVENOUS

## 2016-02-10 MED ORDER — PROCHLORPERAZINE MALEATE 10 MG PO TABS
10.0000 mg | ORAL_TABLET | Freq: Once | ORAL | Status: AC
  Administered 2016-02-10: 10 mg via ORAL
  Filled 2016-02-10: qty 1

## 2016-02-10 MED ORDER — SODIUM CHLORIDE 0.9 % IV SOLN
20.0000 mg | Freq: Once | INTRAVENOUS | Status: AC
  Administered 2016-02-10: 20 mg via INTRAVENOUS
  Filled 2016-02-10: qty 2

## 2016-02-10 MED ORDER — SODIUM CHLORIDE 0.9 % IV SOLN
312.0000 mg | Freq: Once | INTRAVENOUS | Status: AC
  Administered 2016-02-10: 312 mg via INTRAVENOUS
  Filled 2016-02-10: qty 52

## 2016-02-10 MED ORDER — SODIUM CHLORIDE 0.9 % IV SOLN
Freq: Once | INTRAVENOUS | Status: AC
  Administered 2016-02-10: 10:00:00 via INTRAVENOUS
  Filled 2016-02-10: qty 1000

## 2016-02-10 MED ORDER — SODIUM CHLORIDE 0.9 % IV SOLN
15.0000 mg/kg | Freq: Once | INTRAVENOUS | Status: AC
  Administered 2016-02-10: 950 mg via INTRAVENOUS
  Filled 2016-02-10: qty 32

## 2016-02-10 NOTE — Progress Notes (Signed)
Patient states she has "breaking out" on her face and up her nose.  States it itches.  Wants to know if she can be given something for it.  Also complains of fatigue.  States she is eating much better.

## 2016-02-10 NOTE — Progress Notes (Signed)
[facility] OFFICE PROGRESS NOTE  Patient Care Team: Petra Kuba, MD as PCP - General (Family Medicine) Clent Jacks, RN as Registered Nurse   SUMMARY OF ONCOLOGIC HISTORY:  # FEB 2017- STAGE IV ENDOCERVICAL ADENO CA vs Endometrial Adeno ca [s/p Left Inguinal LN Bx; s/p endocervical Bx- pos; p16 pos] Lymphadenopathy-RP/Ingiunal LN; CT-PET  bil pul nodules [sub-cm]/ MRI- pelvis; March 10th- Carbo-taxol q 3W x3 cycles  # MAY 10th CT C/A/P-  PROGRESSION - mild increase in size/number of lung nodules/RP adenopathy/bil adenexal masses/ stable endometrial mass. MAY 15th START TOPOTECAN- TAXOL-AVASTIN  # L-1spinous process lytic lesion [on PET]- hold off X-geva sec to multiple issues  INTERVAL HISTORY:  70 year old female patient with with above history of metastatic endocervical cancer currently second line therapy with Avastin topotecan Taxol approximately 3 weeks ago is here for follow-up.   patient denies any nausea. Denies vomiting.  She denies any  Body aches muscle aches.  She actually feels overall good.   She complains of a skin rash on her  Bilateral cheeks/ face;  And also  A rash on the forearms. Itching.   She has mild numbness in her legs which is currently improved.  No falls.   REVIEW OF SYSTEMS:  A complete 10 point review of system is done which is negative except mentioned above/history of present illness.   PAST MEDICAL HISTORY :  Past Medical History  Diagnosis Date  . Hypertension   . Carpal tunnel syndrome   . Lymphadenopathy 10/25/15  . Decreased appetite   . Weight loss     30 lb weight loss since October 2016  . Anxiety   . Depression   . Nausea & vomiting   . Chemotherapy-induced nausea and vomiting   . Decrease in appetite   . Weight loss   . Endometrial cancer ([place]) 11/06/2015    PAST SURGICAL HISTORY :   Past Surgical History  Procedure Laterality Date  . Carpal tunnel release Left   . Skin cancer excision    . Flexible  sigmoidoscopy  05/2015    performed by Dr. Petra Kuba,    FAMILY HISTORY :   Family History  Problem Relation Age of Onset  . Breast cancer Sister   . Hypertension Sister   . Hypertension Brother   . Ovarian cancer Paternal Aunt   . Diabetes Maternal Grandmother   . Breast cancer Daughter   . Breast cancer Cousin     maternal    SOCIAL HISTORY:   Social History  Substance Use Topics  . Smoking status: Never Smoker   . Smokeless tobacco: Never Used  . Alcohol Use: No    ALLERGIES:  is allergic to ibuprofen and tape.  MEDICATIONS:  Current Outpatient Prescriptions  Medication Sig Dispense Refill  . amLODipine (NORVASC) 10 MG tablet Take 1 tablet by mouth daily. Reported on 11/25/2015    . aspirin EC 81 MG tablet Take 81 mg by mouth daily. Reported on 01/31/2016    . dronabinol (MARINOL) 5 MG capsule Take 1 capsule (5 mg total) by mouth 2 (two) times daily before a meal. 60 capsule 1  . hydrochlorothiazide (HYDRODIURIL) 25 MG tablet Take 1 tablet by mouth daily. Reported on 01/31/2016    . HYDROcodone-acetaminophen (NORCO/VICODIN) 5-325 MG tablet Take 1 tablet by mouth every 6 (six) hours as needed for moderate pain. 40 tablet 0  . losartan (COZAAR) 100 MG tablet Take 1 tablet by mouth daily. Reported on 11/25/2015    .  ondansetron (ZOFRAN) 8 MG tablet Start 3 days after chemo (Patient taking differently: Take 8 mg by mouth as needed for nausea or vomiting. Start 3 days after chemo) 40 tablet 0  . predniSONE (DELTASONE) 20 MG tablet One pill once a day x10 days. 10 tablet 0  . prochlorperazine (COMPAZINE) 10 MG tablet Take 1 tablet (10 mg total) by mouth every 6 (six) hours as needed for nausea or vomiting. 40 tablet 0  . promethazine (PHENERGAN) 25 MG tablet Take 1 tablet (25 mg total) by mouth every 6 (six) hours as needed for nausea or vomiting. 20 tablet 0   No current facility-administered medications for this visit.    PHYSICAL EXAMINATION: ECOG PERFORMANCE STATUS:  1 - Symptomatic but completely ambulatory  BP 144/86 mmHg  Pulse 18  Temp(Src) 97.8 F (36.6 C) (Tympanic)  Resp 18  Wt 139 lb 8.8 oz (63.3 kg)  Filed Weights   02/10/16 0902  Weight: 139 lb 8.8 oz (63.3 kg)   GENERAL: Well-nourished well-developed; Alert, no distress. EYES: no pallor or icterus; Accompanied by husband. OROPHARYNX: no thrush or ulceration; good dentition  NECK: supple, no masses felt LUNGS: clear to auscultation and No wheeze or crackles HEART/CVS: regular rate & rhythm and no murmurs; No lower extremity edema ABDOMEN: abdomen soft, non-tender and normal bowel sounds; approximately 2 cm lymph node felt in the left inguinal region./no Significant improvement Musculoskeletal:no cyanosis of digits and no clubbing  PSYCH: alert & oriented x 3 with fluent speech NEURO: no focal motor/sensory deficits SKIN:  Maculopapular rash [place] area of the face.  LABORATORY DATA:  I have reviewed the data as listed    Component Value Date/Time   NA 135 01/31/2016 0912   K 3.6 01/31/2016 0912   CL 104 01/31/2016 0912   CO2 25 01/31/2016 0912   GLUCOSE 126* 01/31/2016 0912   BUN 8 01/31/2016 0912   CREATININE 0.84 01/31/2016 0912   CALCIUM 9.0 01/31/2016 0912   PROT 7.8 01/31/2016 0912   ALBUMIN 3.2* 01/31/2016 0912   AST 21 01/31/2016 0912   ALT 10* 01/31/2016 0912   ALKPHOS 95 01/31/2016 0912   BILITOT 0.4 01/31/2016 0912   GFRNONAA >60 01/31/2016 0912   GFRAA >60 01/31/2016 0912    No results found for: SPEP, UPEP  Lab Results  Component Value Date   WBC 8.5 02/10/2016   NEUTROABS 6.5 02/10/2016   HGB 10.0* 02/10/2016   HCT 30.0* 02/10/2016   MCV 85.3 02/10/2016   PLT 484* 02/10/2016      Chemistry      Component Value Date/Time   NA 135 01/31/2016 0912   K 3.6 01/31/2016 0912   CL 104 01/31/2016 0912   CO2 25 01/31/2016 0912   BUN 8 01/31/2016 0912   CREATININE 0.84 01/31/2016 0912      Component Value Date/Time   CALCIUM 9.0 01/31/2016 0912    ALKPHOS 95 01/31/2016 0912   AST 21 01/31/2016 0912   ALT 10* 01/31/2016 0912   BILITOT 0.4 01/31/2016 0912       ASSESSMENT & PLAN:   # Likely Endocervical adeno ca [? high grade endometrial adenocarcinoma]  stage IV- platinum and Taxol refractory currently on second line therapy- status post cycle #1 of Taxol-topotecan-Avastin approximately 3 weeks ago.  Seems to have tolerated chemotherapy fairly well except for fatigue.   # Proceed with cycle #2 today. Labs-okay. We will plan to get a  CT scan after 3 cycles.  # skin rash- likley sec  to sun exposure/ on recommend prednisone 20mg /day x 10 days.  New prescription given.  #  Elevated Blood pressure- on hydrochlorothiazide. Patient is already on Norvasc and Cozaar.  Improved. Continue avastin.  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 Monitor for now.  # Follow-up   In approximately 10 days with BMP CBC;   3 weeks CBC CMP chemotherapy.     Cammie Sickle, MD 02/10/2016 9:14 AM.

## 2016-02-11 ENCOUNTER — Inpatient Hospital Stay: Payer: Medicare Other

## 2016-02-11 VITALS — BP 172/89 | HR 70 | Temp 98.0°F | Resp 20

## 2016-02-11 DIAGNOSIS — C541 Malignant neoplasm of endometrium: Secondary | ICD-10-CM

## 2016-02-11 DIAGNOSIS — C539 Malignant neoplasm of cervix uteri, unspecified: Secondary | ICD-10-CM

## 2016-02-11 DIAGNOSIS — Z5111 Encounter for antineoplastic chemotherapy: Secondary | ICD-10-CM | POA: Diagnosis not present

## 2016-02-11 MED ORDER — PROCHLORPERAZINE MALEATE 10 MG PO TABS
10.0000 mg | ORAL_TABLET | Freq: Once | ORAL | Status: AC
  Administered 2016-02-11: 10 mg via ORAL
  Filled 2016-02-11: qty 1

## 2016-02-11 MED ORDER — SODIUM CHLORIDE 0.9 % IV SOLN
0.7500 mg/m2 | Freq: Once | INTRAVENOUS | Status: AC
  Administered 2016-02-11: 1.3 mg via INTRAVENOUS
  Filled 2016-02-11: qty 1.3

## 2016-02-11 MED ORDER — SODIUM CHLORIDE 0.9 % IV SOLN
Freq: Once | INTRAVENOUS | Status: AC
  Administered 2016-02-11: 14:00:00 via INTRAVENOUS
  Filled 2016-02-11: qty 1000

## 2016-02-12 ENCOUNTER — Inpatient Hospital Stay: Payer: Medicare Other

## 2016-02-12 DIAGNOSIS — C539 Malignant neoplasm of cervix uteri, unspecified: Secondary | ICD-10-CM

## 2016-02-12 DIAGNOSIS — C541 Malignant neoplasm of endometrium: Secondary | ICD-10-CM

## 2016-02-12 DIAGNOSIS — Z5111 Encounter for antineoplastic chemotherapy: Secondary | ICD-10-CM | POA: Diagnosis not present

## 2016-02-12 MED ORDER — SODIUM CHLORIDE 0.9 % IV SOLN
0.7500 mg/m2 | Freq: Once | INTRAVENOUS | Status: AC
  Administered 2016-02-12: 1.3 mg via INTRAVENOUS
  Filled 2016-02-12: qty 1.3

## 2016-02-12 MED ORDER — PEGFILGRASTIM 6 MG/0.6ML ~~LOC~~ PSKT
6.0000 mg | PREFILLED_SYRINGE | Freq: Once | SUBCUTANEOUS | Status: AC
  Administered 2016-02-12: 6 mg via SUBCUTANEOUS
  Filled 2016-02-12: qty 0.6

## 2016-02-12 MED ORDER — PROCHLORPERAZINE MALEATE 10 MG PO TABS
10.0000 mg | ORAL_TABLET | Freq: Once | ORAL | Status: AC
Start: 2016-02-12 — End: 2016-02-12
  Administered 2016-02-12: 10 mg via ORAL
  Filled 2016-02-12: qty 1

## 2016-02-12 MED ORDER — SODIUM CHLORIDE 0.9 % IV SOLN
Freq: Once | INTRAVENOUS | Status: AC
  Administered 2016-02-12: 14:00:00 via INTRAVENOUS
  Filled 2016-02-12: qty 1000

## 2016-02-12 MED ORDER — HEPARIN SOD (PORK) LOCK FLUSH 100 UNIT/ML IV SOLN: 500.0000 [IU] | Freq: Once | INTRAVENOUS | Status: DC | PRN

## 2016-02-17 ENCOUNTER — Encounter: Payer: Self-pay | Admitting: Internal Medicine

## 2016-02-17 ENCOUNTER — Other Ambulatory Visit: Payer: Self-pay | Admitting: *Deleted

## 2016-02-17 ENCOUNTER — Other Ambulatory Visit: Payer: Self-pay | Admitting: Internal Medicine

## 2016-02-17 DIAGNOSIS — C541 Malignant neoplasm of endometrium: Secondary | ICD-10-CM

## 2016-02-18 ENCOUNTER — Other Ambulatory Visit: Payer: Self-pay | Admitting: Vascular Surgery

## 2016-02-20 ENCOUNTER — Inpatient Hospital Stay: Payer: Medicare Other

## 2016-02-20 ENCOUNTER — Inpatient Hospital Stay (HOSPITAL_BASED_OUTPATIENT_CLINIC_OR_DEPARTMENT_OTHER): Payer: Medicare Other | Admitting: Internal Medicine

## 2016-02-20 VITALS — BP 160/88 | HR 88 | Temp 97.0°F | Resp 18 | Wt 136.9 lb

## 2016-02-20 DIAGNOSIS — I158 Other secondary hypertension: Secondary | ICD-10-CM

## 2016-02-20 DIAGNOSIS — D630 Anemia in neoplastic disease: Secondary | ICD-10-CM | POA: Insufficient documentation

## 2016-02-20 DIAGNOSIS — C530 Malignant neoplasm of endocervix: Secondary | ICD-10-CM

## 2016-02-20 DIAGNOSIS — R200 Anesthesia of skin: Secondary | ICD-10-CM

## 2016-02-20 DIAGNOSIS — R630 Anorexia: Secondary | ICD-10-CM

## 2016-02-20 DIAGNOSIS — Z7951 Long term (current) use of inhaled steroids: Secondary | ICD-10-CM

## 2016-02-20 DIAGNOSIS — Z789 Other specified health status: Secondary | ICD-10-CM

## 2016-02-20 DIAGNOSIS — R110 Nausea: Secondary | ICD-10-CM

## 2016-02-20 DIAGNOSIS — I10 Essential (primary) hypertension: Secondary | ICD-10-CM | POA: Diagnosis not present

## 2016-02-20 DIAGNOSIS — F418 Other specified anxiety disorders: Secondary | ICD-10-CM

## 2016-02-20 DIAGNOSIS — M899 Disorder of bone, unspecified: Secondary | ICD-10-CM

## 2016-02-20 DIAGNOSIS — R1900 Intra-abdominal and pelvic swelling, mass and lump, unspecified site: Secondary | ICD-10-CM

## 2016-02-20 DIAGNOSIS — Z803 Family history of malignant neoplasm of breast: Secondary | ICD-10-CM

## 2016-02-20 DIAGNOSIS — R112 Nausea with vomiting, unspecified: Secondary | ICD-10-CM

## 2016-02-20 DIAGNOSIS — C541 Malignant neoplasm of endometrium: Secondary | ICD-10-CM

## 2016-02-20 DIAGNOSIS — Z79899 Other long term (current) drug therapy: Secondary | ICD-10-CM

## 2016-02-20 DIAGNOSIS — Z8041 Family history of malignant neoplasm of ovary: Secondary | ICD-10-CM

## 2016-02-20 DIAGNOSIS — R634 Abnormal weight loss: Secondary | ICD-10-CM

## 2016-02-20 DIAGNOSIS — R591 Generalized enlarged lymph nodes: Secondary | ICD-10-CM | POA: Diagnosis not present

## 2016-02-20 DIAGNOSIS — Z5111 Encounter for antineoplastic chemotherapy: Secondary | ICD-10-CM | POA: Diagnosis not present

## 2016-02-20 DIAGNOSIS — R918 Other nonspecific abnormal finding of lung field: Secondary | ICD-10-CM

## 2016-02-20 DIAGNOSIS — Z7982 Long term (current) use of aspirin: Secondary | ICD-10-CM

## 2016-02-20 DIAGNOSIS — C539 Malignant neoplasm of cervix uteri, unspecified: Secondary | ICD-10-CM

## 2016-02-20 LAB — BASIC METABOLIC PANEL
Anion gap: 9 (ref 5–15)
BUN: 8 mg/dL (ref 6–20)
CHLORIDE: 101 mmol/L (ref 101–111)
CO2: 25 mmol/L (ref 22–32)
CREATININE: 0.79 mg/dL (ref 0.44–1.00)
Calcium: 9.4 mg/dL (ref 8.9–10.3)
GFR calc non Af Amer: >60 mL/min (ref >60)
Glucose, Bld: 123 mg/dL — ABNORMAL HIGH (ref 65–99)
POTASSIUM: 3.6 mmol/L (ref 3.5–5.1)
SODIUM: 135 mmol/L (ref 135–145)

## 2016-02-20 LAB — CBC WITH DIFFERENTIAL/PLATELET
BASOS PCT: 0 %
Basophils Absolute: 0 10*3/uL (ref 0–0.1)
EOS ABS: 0 10*3/uL (ref 0–0.7)
Eosinophils Relative: 0 %
HEMATOCRIT: 28.8 % — AB (ref 35.0–47.0)
HEMOGLOBIN: 9.5 g/dL — AB (ref 12.0–16.0)
LYMPHS ABS: 1.6 10*3/uL (ref 1.0–3.6)
Lymphocytes Relative: 13 %
MCH: 28.5 pg (ref 26.0–34.0)
MCHC: 32.9 g/dL (ref 32.0–36.0)
MCV: 86.7 fL (ref 80.0–100.0)
MONOS PCT: 5 %
Monocytes Absolute: 0.6 10*3/uL (ref 0.2–0.9)
NEUTROS ABS: 9.8 10*3/uL — AB (ref 1.4–6.5)
NEUTROS PCT: 82 %
Platelets: 160 10*3/uL (ref 150–440)
RBC: 3.33 MIL/uL — AB (ref 3.80–5.20)
RDW: 18.8 % — ABNORMAL HIGH (ref 11.5–14.5)
WBC: 12 10*3/uL — AB (ref 3.6–11.0)

## 2016-02-20 NOTE — Assessment & Plan Note (Signed)
Systolic [id number] worsened by Avastin. Recommend holding Avastin next cycle/as the patient is planned to get a Mediport placed.

## 2016-02-20 NOTE — Assessment & Plan Note (Signed)
Hemoglobin is 9.8/ multifactorial from malignancy and also from chemotherapy. Monitor for now.

## 2016-02-20 NOTE — Progress Notes (Signed)
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 Patient received cycle #2 approximately 10 days ago.  Patient complains of difficult IV access; interested in getting a port.  Patient otherwise denies any headaches. Denies any nosebleeds or swelling of the legs.  Denies any unusual back pain. Appetite is fair. No significant weight loss since last visit. Chronic mild nausea no vomiting. Denies any significant tingling and numbness in the feet.   REVIEW OF SYSTEMS:  A complete 10 point review of system is done which is negative except mentioned above/history of present illness.   PAST MEDICAL HISTORY :  Past Medical History  Diagnosis Date  . Hypertension   . Carpal tunnel syndrome   . Lymphadenopathy 10/25/15  . Decreased appetite   . Weight loss     30 lb weight loss since October 2016  . Anxiety   . Depression   . Nausea & vomiting   . Chemotherapy-induced nausea and  vomiting   . Decrease in appetite   . Weight loss   . Endometrial cancer ([place]) 11/06/2015    PAST SURGICAL HISTORY :   Past Surgical History  Procedure Laterality Date  . Carpal tunnel release Left   . Skin cancer excision    . Flexible sigmoidoscopy  05/2015    performed by Dr. Petra Kuba,    FAMILY HISTORY :   Family History  Problem Relation Age of Onset  . Breast cancer Sister   . Hypertension Sister   . Hypertension Brother   . Ovarian cancer Paternal Aunt   . Diabetes Maternal Grandmother   . Breast cancer Daughter   . Breast cancer Cousin     maternal    SOCIAL HISTORY:   Social History  Substance Use Topics  . Smoking status: Never Smoker   . Smokeless tobacco: Never Used  . Alcohol Use: No    ALLERGIES:  is allergic to ibuprofen and tape.  MEDICATIONS:  Current Outpatient Prescriptions  Medication Sig Dispense Refill  . amLODipine (NORVASC) 10 MG tablet Take 1 tablet by mouth daily. Reported on 11/25/2015    . aspirin EC 81 MG tablet Take 81 mg by mouth daily. Reported on 01/31/2016    . dronabinol (MARINOL) 5 MG capsule Take 1 capsule (5 mg total) by mouth 2 (two) times daily before a meal. 60 capsule 1  . hydrochlorothiazide (HYDRODIURIL) 25 MG tablet Take 1 tablet by mouth daily. Reported on 01/31/2016    . HYDROcodone-acetaminophen (NORCO/VICODIN) 5-325 MG tablet Take  1 tablet by mouth every 6 (six) hours as needed for moderate pain. 40 tablet 0  . losartan (COZAAR) 100 MG tablet Take 1 tablet by mouth daily. Reported on 11/25/2015    . ondansetron (ZOFRAN) 8 MG tablet Start 3 days after chemo 40 tablet 0  . predniSONE (DELTASONE) 20 MG tablet     . prochlorperazine (COMPAZINE) 10 MG tablet Take 1 tablet (10 mg total) by mouth every 6 (six) hours as needed for nausea or vomiting. 40 tablet 0  . promethazine (PHENERGAN) 25 MG tablet Take 1 tablet (25 mg total) by mouth every 6 (six) hours as needed for nausea or vomiting. 20 tablet 0   No current  facility-administered medications for this visit.    PHYSICAL EXAMINATION: ECOG PERFORMANCE STATUS: 1 - Symptomatic but completely ambulatory  BP 160/88 mmHg  Pulse 88  Temp(Src) 97 F (36.1 C) (Tympanic)  Resp 18  Wt 136 lb 14.5 oz (62.1 kg)  Filed Weights   02/20/16 1115  Weight: 136 lb 14.5 oz (62.1 kg)    GENERAL:Thin built moderately nourished female patient  Alert, no distress and comfortable.   Accompanied by husband.S: no pallor or icterus OROPHARYNX: no thrush or ulceration; good dentition  NECK: supple, no masses felt LYMPH:  no palpable lymphadenopathy in the cervical, axillary region. Left inguinal region 1.5-2 cm lymph node felt.  LUNGS: clear to auscultation and  No wheeze or crackles HEART/CVS: regular rate & rhythm and no murmurs; No lower extremity edema ABDOMEN:abdomen soft, non-tender and normal bowel sounds Musculoskeletal:no cyanosis of digits and no clubbing  PSYCH: alert & oriented x 3 with fluent speech NEURO: no focal motor/sensory deficits SKIN:  no rashes or significant lesions  LABORATORY DATA:  I have reviewed the data as listed    Component Value Date/Time   NA 135 02/20/2016 1000   K 3.6 02/20/2016 1000   CL 101 02/20/2016 1000   CO2 25 02/20/2016 1000   GLUCOSE 123* 02/20/2016 1000   BUN 8 02/20/2016 1000   CREATININE 0.79 02/20/2016 1000   CALCIUM 9.4 02/20/2016 1000   PROT 7.9 02/10/2016 0845   ALBUMIN 3.2* 02/10/2016 0845   AST 19 02/10/2016 0845   ALT 8* 02/10/2016 0845   ALKPHOS 90 02/10/2016 0845   BILITOT 0.3 02/10/2016 0845   GFRNONAA >60 02/20/2016 1000   GFRAA >60 02/20/2016 1000    No results found for: SPEP, UPEP  Lab Results  Component Value Date   WBC 12.0* 02/20/2016   NEUTROABS 9.8* 02/20/2016   HGB 9.5* 02/20/2016   HCT 28.8* 02/20/2016   MCV 86.7 02/20/2016   PLT 160 02/20/2016      Chemistry      Component Value Date/Time   NA 135 02/20/2016 1000   K 3.6 02/20/2016 1000   CL 101 02/20/2016 1000    CO2 25 02/20/2016 1000   BUN 8 02/20/2016 1000   CREATININE 0.79 02/20/2016 1000      Component Value Date/Time   CALCIUM 9.4 02/20/2016 1000   ALKPHOS 90 02/10/2016 0845   AST 19 02/10/2016 0845   ALT 8* 02/10/2016 0845   BILITOT 0.3 02/10/2016 0845       RADIOGRAPHIC STUDIES: I have personally reviewed the radiological images as listed and agreed with the findings in the report. No results found.   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 Patient tolerating chemotherapy fairly well.  Patient's left axillary adenopathy stable in size no obvious progression noted at the same time no significant response noted. Recommend reimaging after 3 cycles.   Nausea without vomiting Systemic chemotherapy currently on antiemetics. Currently stable.   Hypertension Systolic [id number] worsened by Avastin. Recommend holding Avastin next cycle/as the patient is planned to get a Mediport placed.   Poor intravenous access Difficult stick; recommend MediPort. This is planned next week. Hold Avastin next cycle.   No orders of the defined types were placed in this encounter.   All questions were answered. The patient knows to call the clinic with any problems, questions or concerns.Follow-up with me in approximately 10 days proceed with cycle #3.      Cammie Sickle, MD 02/20/2016 5:18 PM        Cammie Sickle, MD 02/20/2016 11:54 AM

## 2016-02-20 NOTE — Assessment & Plan Note (Signed)
Systemic chemotherapy currently on antiemetics. Currently stable.

## 2016-02-20 NOTE — Assessment & Plan Note (Signed)
Difficult stick; recommend MediPort. This is planned next week. Hold Avastin next cycle.

## 2016-02-20 NOTE — Assessment & Plan Note (Signed)
Stage IV metastatic endocervical cancer currently on second line therapy with topotecan Taxol Avastin status post cycle #2 approximately 10 days ago. Patient tolerating chemotherapy fairly well.  Patient's left axillary adenopathy stable in size no obvious progression noted at the same time no significant response noted. Recommend reimaging after 3 cycles.

## 2016-02-24 ENCOUNTER — Encounter: Payer: Self-pay | Admitting: Internal Medicine

## 2016-02-25 ENCOUNTER — Encounter
Admission: RE | Disposition: A | Discharge status: Home / Self Care | Payer: Self-pay | Source: Ambulatory Visit | Attending: Vascular Surgery

## 2016-02-25 ENCOUNTER — Other Ambulatory Visit: Payer: Self-pay | Admitting: *Deleted

## 2016-02-25 ENCOUNTER — Encounter: Payer: Self-pay | Admitting: *Deleted

## 2016-02-25 ENCOUNTER — Ambulatory Visit
Admission: RE | Admit: 2016-02-25 | Discharge: 2016-02-25 | Disposition: A | Discharge status: Home / Self Care | Payer: Medicare Other | Source: Ambulatory Visit | Attending: Vascular Surgery | Admitting: Vascular Surgery

## 2016-02-25 DIAGNOSIS — G5600 Carpal tunnel syndrome, unspecified upper limb: Secondary | ICD-10-CM | POA: Insufficient documentation

## 2016-02-25 DIAGNOSIS — C539 Malignant neoplasm of cervix uteri, unspecified: Secondary | ICD-10-CM | POA: Insufficient documentation

## 2016-02-25 DIAGNOSIS — Z8041 Family history of malignant neoplasm of ovary: Secondary | ICD-10-CM | POA: Diagnosis not present

## 2016-02-25 DIAGNOSIS — Z7982 Long term (current) use of aspirin: Secondary | ICD-10-CM | POA: Insufficient documentation

## 2016-02-25 DIAGNOSIS — Z9109 Other allergy status, other than to drugs and biological substances: Secondary | ICD-10-CM | POA: Insufficient documentation

## 2016-02-25 DIAGNOSIS — I10 Essential (primary) hypertension: Secondary | ICD-10-CM | POA: Insufficient documentation

## 2016-02-25 DIAGNOSIS — Z95828 Presence of other vascular implants and grafts: Secondary | ICD-10-CM

## 2016-02-25 DIAGNOSIS — F418 Other specified anxiety disorders: Secondary | ICD-10-CM | POA: Insufficient documentation

## 2016-02-25 DIAGNOSIS — Z9221 Personal history of antineoplastic chemotherapy: Secondary | ICD-10-CM | POA: Diagnosis not present

## 2016-02-25 DIAGNOSIS — Z8249 Family history of ischemic heart disease and other diseases of the circulatory system: Secondary | ICD-10-CM | POA: Insufficient documentation

## 2016-02-25 DIAGNOSIS — Z803 Family history of malignant neoplasm of breast: Secondary | ICD-10-CM | POA: Diagnosis not present

## 2016-02-25 DIAGNOSIS — R634 Abnormal weight loss: Secondary | ICD-10-CM | POA: Insufficient documentation

## 2016-02-25 DIAGNOSIS — Z886 Allergy status to analgesic agent status: Secondary | ICD-10-CM | POA: Insufficient documentation

## 2016-02-25 HISTORY — PX: PERIPHERAL VASCULAR CATHETERIZATION: SHX172C

## 2016-02-25 SURGERY — PORTA CATH INSERTION
Anesthesia: Moderate Sedation

## 2016-02-25 MED ORDER — FENTANYL CITRATE (PF) 100 MCG/2ML IJ SOLN
INTRAMUSCULAR | Status: AC
  Filled 2016-02-25: qty 2

## 2016-02-25 MED ORDER — FENTANYL CITRATE (PF) 100 MCG/2ML IJ SOLN
INTRAMUSCULAR | Status: DC | PRN
  Administered 2016-02-25 (×2): 50 ug via INTRAVENOUS

## 2016-02-25 MED ORDER — MIDAZOLAM HCL 2 MG/2ML IJ SOLN
INTRAMUSCULAR | Status: DC | PRN
  Administered 2016-02-25: 1.5 mg via INTRAVENOUS
  Administered 2016-02-25: 2 mg via INTRAVENOUS

## 2016-02-25 MED ORDER — SODIUM CHLORIDE 0.9 % IV SOLN
INTRAVENOUS | Status: DC
  Administered 2016-02-25: 09:00:00 via INTRAVENOUS

## 2016-02-25 MED ORDER — MIDAZOLAM HCL 5 MG/5ML IJ SOLN
INTRAMUSCULAR | Status: AC
  Filled 2016-02-25: qty 5

## 2016-02-25 MED ORDER — LIDOCAINE-EPINEPHRINE (PF) 1 %-1:200000 IJ SOLN
INTRAMUSCULAR | Status: AC
  Filled 2016-02-25: qty 30

## 2016-02-25 MED ORDER — SODIUM CHLORIDE 0.9 % IR SOLN
Freq: Once | Status: DC
  Filled 2016-02-25: qty 2

## 2016-02-25 MED ORDER — DEXTROSE 5 % IV SOLN
1.5000 g | INTRAVENOUS | Status: AC
  Administered 2016-02-25: 1.5 g via INTRAVENOUS

## 2016-02-25 MED ORDER — LIDOCAINE-PRILOCAINE 2.5-2.5 % EX CREA: 1.0000 "application " | TOPICAL_CREAM | CUTANEOUS | Status: AC | PRN

## 2016-02-25 MED ORDER — ONDANSETRON HCL 4 MG/2ML IJ SOLN: 4.0000 mg | Freq: Four times a day (QID) | INTRAMUSCULAR | Status: DC | PRN

## 2016-02-25 MED ORDER — HYDROMORPHONE HCL 1 MG/ML IJ SOLN: 1.0000 mg | Freq: Once | INTRAMUSCULAR | Status: DC

## 2016-02-25 SURGICAL SUPPLY — 9 items
BAG DECANTER STRL (MISCELLANEOUS) ×3 IMPLANT
DRAPE INCISE IOBAN 66X45 STRL (DRAPES) ×6 IMPLANT
KIT PORT POWER 8FR ISP CVUE (Catheter) ×3 IMPLANT
PACK ANGIOGRAPHY (CUSTOM PROCEDURE TRAY) ×3 IMPLANT
PREP CHG 10.5 TEAL (MISCELLANEOUS) ×3 IMPLANT
SUT MNCRL AB 4-0 PS2 18 (SUTURE) ×3 IMPLANT
SUT PROLENE 0 CT 1 30 (SUTURE) ×3 IMPLANT
SUTURE VIC 3-0 (SUTURE) ×3 IMPLANT
TOWEL OR 17X26 4PK STRL BLUE (TOWEL DISPOSABLE) ×3 IMPLANT

## 2016-02-25 NOTE — H&P (Signed)
[facility] History & Physical Update  The patient was interviewed and re-examined.  The patient's previous History and Physical has been reviewed and is unchanged.  There is no change in the plan of care. We plan to proceed with the scheduled procedure.  Tiffnay Bossi, Dolores Lory, MD  02/25/2016, 10:27 AM

## 2016-02-25 NOTE — Op Note (Signed)
OPERATIVE NOTE   PROCEDURE: 1. Placement of a right IJ Infuse-a-Port  PRE-OPERATIVE DIAGNOSIS: Metastatic cervical carcinoma  POST-OPERATIVE DIAGNOSIS: Same  SURGEON: Katha Cabal M.D.  ANESTHESIA: Conscious sedation combined with 1% lidocaine with epinephrine  ESTIMATED BLOOD LOSS: Minimal   FINDING(S): 1.  Patent vein  SPECIMEN(S): None  INDICATIONS:   Jennifer Berry is a 70 y.o. female who presents with metastatic cervical carcinoma.  She is to undergo chemotherapy and therefore requires appropriate IV access. The risks and benefits of been reviewed all questions answered patient agrees to proceed.  DESCRIPTION: After obtaining full informed written consent, the patient was brought back to the special procedure suite and placed in the supine position. The patient's right neck and chest wall are prepped and draped in sterile fashion. Appropriate timeout was called.  Ultrasound is placed in a sterile sleeve, ultrasound is utilized to avoid vascular injury as well as secondary to lack of appropriate landmarks. The right internal jugular vein is identified. It is echolucent and homogeneous as well as easily compressible indicating patency. 1% lidocaine is infiltrated into the soft tissue at the base of the neck as well as on the chest wall.  Under direct ultrasound visualization Seldinger needle is inserted into the right internal jugular vein. J-wire is advanced under fluoroscopic guidance. A small counterincision was created at the wire insertion site. A transverse incision is created 2 fingerbreadths below the scapula and a pocket is fashioned using both blunt and sharp dissection. The pocket is tested for appropriate size with the hub of the Infuse-a-Port. The tunneling device is then used to pull the intravascular portion of the catheter from the pocket to the neck counterincision.  Dilator and peel-away sheath were then inserted over the wire and the wire is removed. Catheter  is then advanced into the venous system without difficulty. Peel-away sheath was then removed.  Catheter is then positioned under fluoroscopic guidance at the atrial caval junction. It is then transected connected to the hub and the hope is slipped into the subcutaneous pocket on the chest wall. The hub was then accessed percutaneously and aspirates easily and flushes well and is flushed with 30 cc of heparinized saline. The pocket incision is then closed in layers using interrupted 3-0 Vicryl for the subcutaneous tissues and 4-0 Monocryl subcuticular for skin closure. Dermabond is applied. The neck counterincision was closed with 4-0 Monocryl subcuticular and Dermabond as well.  The patient tolerated the procedure well and there were no immediate complications.  COMPLICATIONS: None  CONDITION: Unchanged  Katha Cabal M.D. [facility] Office: [phone]   02/25/2016, 12:17 PM

## 2016-02-26 ENCOUNTER — Encounter: Payer: Self-pay | Admitting: Vascular Surgery

## 2016-02-26 ENCOUNTER — Encounter: Payer: Self-pay | Admitting: *Deleted

## 2016-02-26 NOTE — Progress Notes (Signed)
emla cream prior auth.  Ref. Number. [id number]. Josem Kaufmann is good until 05/26/2016.  [place] informed.

## 2016-02-27 ENCOUNTER — Other Ambulatory Visit: Payer: Medicare Other

## 2016-03-02 ENCOUNTER — Inpatient Hospital Stay: Payer: Medicare Other

## 2016-03-02 ENCOUNTER — Inpatient Hospital Stay (HOSPITAL_BASED_OUTPATIENT_CLINIC_OR_DEPARTMENT_OTHER): Payer: Medicare Other | Admitting: Internal Medicine

## 2016-03-02 VITALS — BP 148/85 | HR 79 | Temp 97.6°F | Resp 18 | Wt 138.2 lb

## 2016-03-02 VITALS — BP 123/72 | HR 60 | Resp 18

## 2016-03-02 DIAGNOSIS — I10 Essential (primary) hypertension: Secondary | ICD-10-CM

## 2016-03-02 DIAGNOSIS — C539 Malignant neoplasm of cervix uteri, unspecified: Secondary | ICD-10-CM

## 2016-03-02 DIAGNOSIS — Z7689 Persons encountering health services in other specified circumstances: Secondary | ICD-10-CM

## 2016-03-02 DIAGNOSIS — R21 Rash and other nonspecific skin eruption: Secondary | ICD-10-CM

## 2016-03-02 DIAGNOSIS — C541 Malignant neoplasm of endometrium: Secondary | ICD-10-CM

## 2016-03-02 DIAGNOSIS — R591 Generalized enlarged lymph nodes: Secondary | ICD-10-CM

## 2016-03-02 DIAGNOSIS — Z7951 Long term (current) use of inhaled steroids: Secondary | ICD-10-CM | POA: Diagnosis not present

## 2016-03-02 DIAGNOSIS — Z8041 Family history of malignant neoplasm of ovary: Secondary | ICD-10-CM

## 2016-03-02 DIAGNOSIS — G629 Polyneuropathy, unspecified: Secondary | ICD-10-CM

## 2016-03-02 DIAGNOSIS — R200 Anesthesia of skin: Secondary | ICD-10-CM

## 2016-03-02 DIAGNOSIS — R1900 Intra-abdominal and pelvic swelling, mass and lump, unspecified site: Secondary | ICD-10-CM

## 2016-03-02 DIAGNOSIS — C530 Malignant neoplasm of endocervix: Secondary | ICD-10-CM | POA: Diagnosis not present

## 2016-03-02 DIAGNOSIS — R918 Other nonspecific abnormal finding of lung field: Secondary | ICD-10-CM

## 2016-03-02 DIAGNOSIS — R634 Abnormal weight loss: Secondary | ICD-10-CM

## 2016-03-02 DIAGNOSIS — Z803 Family history of malignant neoplasm of breast: Secondary | ICD-10-CM

## 2016-03-02 DIAGNOSIS — Z7982 Long term (current) use of aspirin: Secondary | ICD-10-CM

## 2016-03-02 DIAGNOSIS — Z5111 Encounter for antineoplastic chemotherapy: Secondary | ICD-10-CM | POA: Diagnosis not present

## 2016-03-02 DIAGNOSIS — Z79899 Other long term (current) drug therapy: Secondary | ICD-10-CM

## 2016-03-02 DIAGNOSIS — F418 Other specified anxiety disorders: Secondary | ICD-10-CM

## 2016-03-02 DIAGNOSIS — R112 Nausea with vomiting, unspecified: Secondary | ICD-10-CM

## 2016-03-02 DIAGNOSIS — T451X5S Adverse effect of antineoplastic and immunosuppressive drugs, sequela: Secondary | ICD-10-CM

## 2016-03-02 DIAGNOSIS — M899 Disorder of bone, unspecified: Secondary | ICD-10-CM

## 2016-03-02 DIAGNOSIS — R630 Anorexia: Secondary | ICD-10-CM

## 2016-03-02 LAB — CBC WITH DIFFERENTIAL/PLATELET
BASOS PCT: 1 %
Basophils Absolute: 0 10*3/uL (ref 0–0.1)
EOS ABS: 0 10*3/uL (ref 0–0.7)
EOS PCT: 0 %
HCT: 27.6 % — ABNORMAL LOW (ref 35.0–47.0)
HEMOGLOBIN: 9.4 g/dL — AB (ref 12.0–16.0)
LYMPHS ABS: 0.7 10*3/uL — AB (ref 1.0–3.6)
Lymphocytes Relative: 9 %
MCH: 29.9 pg (ref 26.0–34.0)
MCHC: 34.1 g/dL (ref 32.0–36.0)
MCV: 87.6 fL (ref 80.0–100.0)
MONO ABS: 0.9 10*3/uL (ref 0.2–0.9)
MONOS PCT: 12 %
NEUTROS PCT: 78 %
Neutro Abs: 6 10*3/uL (ref 1.4–6.5)
Platelets: 457 10*3/uL — ABNORMAL HIGH (ref 150–440)
RBC: 3.15 MIL/uL — ABNORMAL LOW (ref 3.80–5.20)
RDW: 18.8 % — AB (ref 11.5–14.5)
WBC: 7.7 10*3/uL (ref 3.6–11.0)

## 2016-03-02 LAB — COMPREHENSIVE METABOLIC PANEL
ALK PHOS: 78 U/L (ref 38–126)
ALT: 8 U/L — ABNORMAL LOW (ref 14–54)
AST: 16 U/L (ref 15–41)
Albumin: 3 g/dL — ABNORMAL LOW (ref 3.5–5.0)
Anion gap: 6 (ref 5–15)
BILIRUBIN TOTAL: 0.5 mg/dL (ref 0.3–1.2)
BUN: 12 mg/dL (ref 6–20)
CALCIUM: 9 mg/dL (ref 8.9–10.3)
CO2: 25 mmol/L (ref 22–32)
CREATININE: 0.69 mg/dL (ref 0.44–1.00)
Chloride: 102 mmol/L (ref 101–111)
Glucose, Bld: 136 mg/dL — ABNORMAL HIGH (ref 65–99)
Potassium: 3.7 mmol/L (ref 3.5–5.1)
SODIUM: 133 mmol/L — AB (ref 135–145)
TOTAL PROTEIN: 7.7 g/dL (ref 6.5–8.1)

## 2016-03-02 LAB — PROTEIN, URINE, RANDOM: Total Protein, Urine: 34 mg/dL

## 2016-03-02 MED ORDER — DIPHENHYDRAMINE HCL 50 MG/ML IJ SOLN
50.0000 mg | Freq: Once | INTRAMUSCULAR | Status: AC
  Administered 2016-03-02: 50 mg via INTRAVENOUS
  Filled 2016-03-02: qty 1

## 2016-03-02 MED ORDER — FAMOTIDINE IN NACL 20-0.9 MG/50ML-% IV SOLN
20.0000 mg | Freq: Two times a day (BID) | INTRAVENOUS | Status: DC
  Administered 2016-03-02: 20 mg via INTRAVENOUS
  Filled 2016-03-02: qty 50

## 2016-03-02 MED ORDER — SODIUM CHLORIDE 0.9 % IV SOLN
312.0000 mg | Freq: Once | INTRAVENOUS | Status: AC
  Administered 2016-03-02: 312 mg via INTRAVENOUS
  Filled 2016-03-02: qty 52

## 2016-03-02 MED ORDER — PROCHLORPERAZINE MALEATE 10 MG PO TABS
10.0000 mg | ORAL_TABLET | Freq: Once | ORAL | Status: AC
  Administered 2016-03-02: 10 mg via ORAL
  Filled 2016-03-02: qty 1

## 2016-03-02 MED ORDER — SODIUM CHLORIDE 0.9 % IV SOLN
15.0000 mg/kg | Freq: Once | INTRAVENOUS | Status: AC
  Administered 2016-03-02: 950 mg via INTRAVENOUS
  Filled 2016-03-02: qty 32

## 2016-03-02 MED ORDER — HEPARIN SOD (PORK) LOCK FLUSH 100 UNIT/ML IV SOLN
500.0000 [IU] | Freq: Once | INTRAVENOUS | Status: DC | PRN
Start: 2016-03-02 — End: 2016-03-02

## 2016-03-02 MED ORDER — HYDROCODONE-ACETAMINOPHEN 5-325 MG PO TABS: 1.0000 | ORAL_TABLET | Freq: Four times a day (QID) | ORAL | Status: DC | PRN

## 2016-03-02 MED ORDER — TOPOTECAN HCL CHEMO INJECTION 4 MG
0.7500 mg/m2 | Freq: Once | INTRAVENOUS | Status: AC
  Administered 2016-03-02: 1.3 mg via INTRAVENOUS
  Filled 2016-03-02: qty 1.3

## 2016-03-02 MED ORDER — SODIUM CHLORIDE 0.9% FLUSH
10.0000 mL | Freq: Once | INTRAVENOUS | Status: AC
  Administered 2016-03-02: 10 mL via INTRAVENOUS
  Filled 2016-03-02: qty 10

## 2016-03-02 MED ORDER — HEPARIN SOD (PORK) LOCK FLUSH 100 UNIT/ML IV SOLN
500.0000 [IU] | Freq: Once | INTRAVENOUS | Status: AC
  Administered 2016-03-02: 500 [IU] via INTRAVENOUS
  Filled 2016-03-02: qty 5

## 2016-03-02 MED ORDER — HYDROCODONE-ACETAMINOPHEN 5-325 MG PO TABS
2.0000 | ORAL_TABLET | Freq: Once | ORAL | Status: AC
  Administered 2016-03-02: 2 via ORAL
  Filled 2016-03-02: qty 2

## 2016-03-02 MED ORDER — SODIUM CHLORIDE 0.9 % IV SOLN
Freq: Once | INTRAVENOUS | Status: AC
  Administered 2016-03-02: 10:00:00 via INTRAVENOUS
  Filled 2016-03-02: qty 1000

## 2016-03-02 MED ORDER — SODIUM CHLORIDE 0.9 % IV SOLN
20.0000 mg | Freq: Once | INTRAVENOUS | Status: AC
  Administered 2016-03-02: 20 mg via INTRAVENOUS
  Filled 2016-03-02: qty 2

## 2016-03-02 NOTE — Progress Notes (Signed)
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 endocervical] Lymphadenopathy-RP/Ingiunal LN; CT-PET bil pul nodules [sub-cm]/ MRI- pelvis; March 10th- Carbo-taxol x3 cycles- MAY CT- PROGRESSION  # MAY 2017- TAXOL- TOPOTECAN-AVASTIN  # L-1spinous process lytic lesion [on PET]- not on X-geva.      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 Patient received cycle #2 approximately 3 weeks ago.  Patient complains of difficult IV access; got port on 20th.   patient's nausea is improved. Appetite is good. She is not losing more weight.  She has mild numbness in her hand and feet.  No difficulty walking  Patient otherwise denies any headaches. Denies any nosebleeds or swelling of the legs.   patient noted to have increasing back pain in the last few days. She has been taking Tylenol without any significant benefit.  REVIEW OF SYSTEMS:  A complete 10 point review of system is done which is negative except mentioned above/history of present illness.   PAST MEDICAL HISTORY :  Past Medical History  Diagnosis Date  . Hypertension   . Carpal tunnel syndrome   . Lymphadenopathy 10/25/15  . Decreased appetite   . Weight loss     30 lb weight  loss since October 2016  . Anxiety   . Depression   . Nausea & vomiting   . Chemotherapy-induced nausea and vomiting   . Decrease in appetite   . Weight loss   . Endometrial cancer ([place]) 11/06/2015  . Port-a-cath in place     PAST SURGICAL HISTORY :   Past Surgical History  Procedure Laterality Date  . Carpal tunnel release Left   . Skin cancer excision    . Flexible sigmoidoscopy  05/2015    performed by Dr. Petra Kuba,  . Peripheral vascular catheterization N/A 02/25/2016    Procedure: Glori Luis Cath Insertion;  Surgeon: Katha Cabal, MD;  Location: [place] [facility];  Service: Cardiovascular;  Laterality: N/A;    FAMILY HISTORY :   Family History  Problem Relation Age of Onset  . Breast cancer Sister   . Hypertension Sister   . Hypertension Brother   . Ovarian cancer Paternal Aunt   . Diabetes Maternal Grandmother   . Breast cancer Daughter   . Breast cancer Cousin     maternal    SOCIAL HISTORY:   Social History  Substance Use Topics  . Smoking status: Never Smoker   . Smokeless tobacco: Never Used  . Alcohol Use: No    ALLERGIES:  is allergic to ibuprofen and tape.  MEDICATIONS:  Current Outpatient Prescriptions  Medication Sig Dispense Refill  . amLODipine (NORVASC) 10 MG tablet Take 1 tablet by mouth daily. Reported on 11/25/2015    . aspirin EC 81  MG tablet Take 81 mg by mouth daily. Reported on 01/31/2016    . dronabinol (MARINOL) 5 MG capsule Take 1 capsule (5 mg total) by mouth 2 (two) times daily before a meal. 60 capsule 1  . hydrochlorothiazide (HYDRODIURIL) 25 MG tablet Take 1 tablet by mouth daily. Reported on 01/31/2016    . HYDROcodone-acetaminophen (NORCO/VICODIN) 5-325 MG tablet Take 1 tablet by mouth every 6 (six) hours as needed for moderate pain. 40 tablet 0  . lidocaine-prilocaine (EMLA) cream Apply 1 application topically as needed. To port site 30 g 0  . losartan (COZAAR) 100 MG tablet Take 1 tablet by mouth daily. Reported on  11/25/2015    . ondansetron (ZOFRAN) 8 MG tablet Start 3 days after chemo 40 tablet 0  . predniSONE (DELTASONE) 20 MG tablet     . prochlorperazine (COMPAZINE) 10 MG tablet Take 1 tablet (10 mg total) by mouth every 6 (six) hours as needed for nausea or vomiting. 40 tablet 0  . promethazine (PHENERGAN) 25 MG tablet Take 1 tablet (25 mg total) by mouth every 6 (six) hours as needed for nausea or vomiting. 20 tablet 0   No current facility-administered medications for this visit.   Facility-Administered Medications Ordered in Other Visits  Medication Dose Route Frequency Provider Last Rate Last Dose  . famotidine (PEPCID) IVPB 20 mg premix  20 mg Intravenous Q12H Cammie Sickle, MD   20 mg at 03/02/16 1001  . heparin lock flush 100 unit/mL  500 Units Intracatheter Once PRN Cammie Sickle, MD        PHYSICAL EXAMINATION: ECOG PERFORMANCE STATUS: 1 - Symptomatic but completely ambulatory  BP 148/85 mmHg  Pulse 79  Temp(Src) 97.6 F (36.4 C) (Tympanic)  Resp 18  Wt 138 lb 3.7 oz (62.7 kg)  Filed Weights   03/02/16 0835  Weight: 138 lb 3.7 oz (62.7 kg)    GENERAL:Thin built moderately nourished female patient  Alert, no distress and comfortable.   Accompanied by husband.S: no pallor or icterus OROPHARYNX: no thrush or ulceration; good dentition  NECK: supple, no masses felt LYMPH:  no palpable lymphadenopathy in the cervical, axillary region. Left inguinal region 1.5-2 cm lymph node felt.  LUNGS: clear to auscultation and  No wheeze or crackles HEART/CVS: regular rate & rhythm and no murmurs; No lower extremity edema ABDOMEN:abdomen soft, non-tender and normal bowel sounds Musculoskeletal:no cyanosis of digits and no clubbing  PSYCH: alert & oriented x 3 with fluent speech NEURO: no focal motor/sensory deficits SKIN:  no rashes or significant lesions  LABORATORY DATA:  I have reviewed the data as listed    Component Value Date/Time   NA 133* 03/02/2016 0814   K 3.7  03/02/2016 0814   CL 102 03/02/2016 0814   CO2 25 03/02/2016 0814   GLUCOSE 136* 03/02/2016 0814   BUN 12 03/02/2016 0814   CREATININE 0.69 03/02/2016 0814   CALCIUM 9.0 03/02/2016 0814   PROT 7.7 03/02/2016 0814   ALBUMIN 3.0* 03/02/2016 0814   AST 16 03/02/2016 0814   ALT 8* 03/02/2016 0814   ALKPHOS 78 03/02/2016 0814   BILITOT 0.5 03/02/2016 0814   GFRNONAA >60 03/02/2016 0814   GFRAA >60 03/02/2016 0814    No results found for: SPEP, UPEP  Lab Results  Component Value Date   WBC 7.7 03/02/2016   NEUTROABS 6.0 03/02/2016   HGB 9.4* 03/02/2016   HCT 27.6* 03/02/2016   MCV 87.6 03/02/2016   PLT 457* 03/02/2016  Chemistry      Component Value Date/Time   NA 133* 03/02/2016 0814   K 3.7 03/02/2016 0814   CL 102 03/02/2016 0814   CO2 25 03/02/2016 0814   BUN 12 03/02/2016 0814   CREATININE 0.69 03/02/2016 0814      Component Value Date/Time   CALCIUM 9.0 03/02/2016 0814   ALKPHOS 78 03/02/2016 0814   AST 16 03/02/2016 0814   ALT 8* 03/02/2016 0814   BILITOT 0.5 03/02/2016 0814       RADIOGRAPHIC STUDIES: I have personally reviewed the radiological images as listed and agreed with the findings in the report. No results found.   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  Clinically no obvious progression noted;  However  Lumbar pain- visit concern [ see discussion below]  # proceed with cycle # 3 today- Taxol Avastin to petechia and  Patient tolerating chemotherapy fairly well;   Except for mildly elevated blood pressure  #  Low back pain/ L1 spinous metastases on previous scan noted.  Given her prescription for hydrocodone. Recommend a referral to radiation oncology.  Also start Xgeva every 6 weeks.  #  Plan follow-up with me in approximately 10 days with labs.  CT scan 2 weeks;   Follow-up with me / labs/  Chemotherapy cycle #4 in 4 weeks.       Orders Placed This Encounter  Procedures  . CT ABDOMEN PELVIS W CONTRAST    Standing Status: Future     Number of Occurrences:      Standing Expiration Date: 06/01/2017    Order Specific Question:  Reason for Exam (SYMPTOM  OR DIAGNOSIS REQUIRED)    Answer:  cervical cancer metastatic    Order Specific Question:  Preferred imaging location?    Answer:  [facility]  . CT CHEST W CONTRAST    Standing Status: Future     Number of Occurrences:      Standing Expiration Date: 05/02/2017    Order Specific Question:  Reason for Exam (SYMPTOM  OR DIAGNOSIS REQUIRED)    Answer:  cervical cancer metastatic    Order Specific Question:  Preferred imaging location?    Answer:  [facility]  . CBC with Differential    Standing Status: Future     Number of Occurrences:      Standing Expiration Date: 03/02/2017  . Basic metabolic panel    Standing Status: Future     Number of Occurrences:      Standing Expiration Date: 03/02/2017  . CBC with Differential    Standing Status: Future     Number of Occurrences:      Standing Expiration Date: 03/02/2017  . Comprehensive metabolic panel    Standing Status: Future     Number of Occurrences:      Standing Expiration Date: 03/02/2017   All questions were answered. The patient knows to call the clinic with any problems, questions or concerns    Cammie Sickle, MD 03/02/2016 5:53 PM        Cammie Sickle, MD 03/02/2016 5:53 PM

## 2016-03-02 NOTE — Assessment & Plan Note (Addendum)
Stage IV metastatic endocervical cancer currently on therapy with topotecan Taxol Avastin status post cycle #2 approximately 3 week ago.  Clinically no obvious progression noted;  However  Lumbar pain- visit concern [ see discussion below]  # proceed with cycle # 3 today- Taxol Avastin to petechia and  Patient tolerating chemotherapy fairly well;   Except for mildly elevated blood pressure  #  Low back pain/ L1 spinous metastases on previous scan noted.  Given her prescription for hydrocodone. Recommend a referral to radiation oncology.  Also start Xgeva every 6 weeks.  #  Plan follow-up with me in approximately 10 days with labs.  CT scan 2 weeks;  Follow-up with me / labs/  Chemotherapy cycle #4 in 4 weeks.

## 2016-03-02 NOTE — Progress Notes (Signed)
Patient stated she started having some left sided mid back pain last week.  She has had to increase her pain medicine since then.

## 2016-03-03 ENCOUNTER — Inpatient Hospital Stay: Payer: Medicare Other

## 2016-03-03 VITALS — BP 168/72 | HR 67 | Temp 96.8°F | Resp 20

## 2016-03-03 DIAGNOSIS — Z5111 Encounter for antineoplastic chemotherapy: Secondary | ICD-10-CM | POA: Diagnosis not present

## 2016-03-03 DIAGNOSIS — C539 Malignant neoplasm of cervix uteri, unspecified: Secondary | ICD-10-CM

## 2016-03-03 MED ORDER — PROCHLORPERAZINE MALEATE 10 MG PO TABS
10.0000 mg | ORAL_TABLET | Freq: Once | ORAL | Status: AC
  Administered 2016-03-03: 10 mg via ORAL
  Filled 2016-03-03: qty 1

## 2016-03-03 MED ORDER — TOPOTECAN HCL CHEMO INJECTION 4 MG
0.7500 mg/m2 | Freq: Once | INTRAVENOUS | Status: AC
  Administered 2016-03-03: 1.3 mg via INTRAVENOUS
  Filled 2016-03-03: qty 1.3

## 2016-03-03 MED ORDER — SODIUM CHLORIDE 0.9 % IV SOLN
Freq: Once | INTRAVENOUS | Status: AC
  Administered 2016-03-03: 14:00:00 via INTRAVENOUS
  Filled 2016-03-03: qty 1000

## 2016-03-03 MED ORDER — SODIUM CHLORIDE 0.9% FLUSH
10.0000 mL | INTRAVENOUS | Status: DC | PRN
  Administered 2016-03-03: 10 mL
  Filled 2016-03-03: qty 10

## 2016-03-03 MED ORDER — HEPARIN SOD (PORK) LOCK FLUSH 100 UNIT/ML IV SOLN
500.0000 [IU] | Freq: Once | INTRAVENOUS | Status: AC | PRN
  Administered 2016-03-03: 500 [IU]
  Filled 2016-03-03: qty 5

## 2016-03-04 ENCOUNTER — Ambulatory Visit
Admission: RE | Admit: 2016-03-04 | Discharge: 2016-03-04 | Disposition: A | Discharge status: Home / Self Care | Payer: Medicare Other | Source: Ambulatory Visit | Attending: Radiation Oncology | Admitting: Radiation Oncology

## 2016-03-04 ENCOUNTER — Inpatient Hospital Stay: Payer: Medicare Other

## 2016-03-04 ENCOUNTER — Encounter: Payer: Self-pay | Admitting: Radiation Oncology

## 2016-03-04 VITALS — BP 177/96 | HR 98 | Temp 96.0°F | Wt 139.7 lb

## 2016-03-04 VITALS — BP 174/94

## 2016-03-04 DIAGNOSIS — R627 Adult failure to thrive: Secondary | ICD-10-CM | POA: Diagnosis not present

## 2016-03-04 DIAGNOSIS — I10 Essential (primary) hypertension: Secondary | ICD-10-CM | POA: Insufficient documentation

## 2016-03-04 DIAGNOSIS — Z510 Encounter for antineoplastic radiation therapy: Secondary | ICD-10-CM | POA: Diagnosis present

## 2016-03-04 DIAGNOSIS — C7951 Secondary malignant neoplasm of bone: Secondary | ICD-10-CM | POA: Diagnosis not present

## 2016-03-04 DIAGNOSIS — Z7952 Long term (current) use of systemic steroids: Secondary | ICD-10-CM | POA: Insufficient documentation

## 2016-03-04 DIAGNOSIS — Z803 Family history of malignant neoplasm of breast: Secondary | ICD-10-CM | POA: Diagnosis not present

## 2016-03-04 DIAGNOSIS — R634 Abnormal weight loss: Secondary | ICD-10-CM | POA: Insufficient documentation

## 2016-03-04 DIAGNOSIS — G5600 Carpal tunnel syndrome, unspecified upper limb: Secondary | ICD-10-CM | POA: Diagnosis not present

## 2016-03-04 DIAGNOSIS — Z5111 Encounter for antineoplastic chemotherapy: Secondary | ICD-10-CM | POA: Diagnosis not present

## 2016-03-04 DIAGNOSIS — R110 Nausea: Secondary | ICD-10-CM | POA: Diagnosis not present

## 2016-03-04 DIAGNOSIS — C541 Malignant neoplasm of endometrium: Secondary | ICD-10-CM | POA: Diagnosis not present

## 2016-03-04 DIAGNOSIS — Z7982 Long term (current) use of aspirin: Secondary | ICD-10-CM | POA: Insufficient documentation

## 2016-03-04 DIAGNOSIS — R630 Anorexia: Secondary | ICD-10-CM | POA: Insufficient documentation

## 2016-03-04 DIAGNOSIS — M545 Low back pain: Secondary | ICD-10-CM | POA: Insufficient documentation

## 2016-03-04 DIAGNOSIS — Z79899 Other long term (current) drug therapy: Secondary | ICD-10-CM | POA: Insufficient documentation

## 2016-03-04 DIAGNOSIS — Z8041 Family history of malignant neoplasm of ovary: Secondary | ICD-10-CM | POA: Insufficient documentation

## 2016-03-04 DIAGNOSIS — Z85828 Personal history of other malignant neoplasm of skin: Secondary | ICD-10-CM | POA: Insufficient documentation

## 2016-03-04 DIAGNOSIS — C539 Malignant neoplasm of cervix uteri, unspecified: Secondary | ICD-10-CM

## 2016-03-04 DIAGNOSIS — R918 Other nonspecific abnormal finding of lung field: Secondary | ICD-10-CM | POA: Insufficient documentation

## 2016-03-04 DIAGNOSIS — F418 Other specified anxiety disorders: Secondary | ICD-10-CM | POA: Diagnosis not present

## 2016-03-04 DIAGNOSIS — R591 Generalized enlarged lymph nodes: Secondary | ICD-10-CM | POA: Insufficient documentation

## 2016-03-04 LAB — SURGICAL PATHOLOGY

## 2016-03-04 MED ORDER — HEPARIN SOD (PORK) LOCK FLUSH 100 UNIT/ML IV SOLN
500.0000 [IU] | Freq: Once | INTRAVENOUS | Status: AC | PRN
  Administered 2016-03-04: 500 [IU]
  Filled 2016-03-04: qty 5

## 2016-03-04 MED ORDER — PROCHLORPERAZINE MALEATE 10 MG PO TABS
10.0000 mg | ORAL_TABLET | Freq: Once | ORAL | Status: AC
  Administered 2016-03-04: 10 mg via ORAL
  Filled 2016-03-04: qty 1

## 2016-03-04 MED ORDER — SODIUM CHLORIDE 0.9 % IV SOLN
Freq: Once | INTRAVENOUS | Status: AC
  Administered 2016-03-04: 14:00:00 via INTRAVENOUS
  Filled 2016-03-04: qty 1000

## 2016-03-04 MED ORDER — TOPOTECAN HCL CHEMO INJECTION 4 MG
0.7500 mg/m2 | Freq: Once | INTRAVENOUS | Status: AC
  Administered 2016-03-04: 1.3 mg via INTRAVENOUS
  Filled 2016-03-04: qty 1.3

## 2016-03-04 MED ORDER — PEGFILGRASTIM 6 MG/0.6ML ~~LOC~~ PSKT
6.0000 mg | PREFILLED_SYRINGE | Freq: Once | SUBCUTANEOUS | Status: AC
  Administered 2016-03-04: 6 mg via SUBCUTANEOUS
  Filled 2016-03-04: qty 0.6

## 2016-03-04 NOTE — Consult Note (Signed)
Except an outstanding is perfect of Radiation Oncology NEW PATIENT EVALUATION  Name: Jennifer Berry  MRN: [id number]  Date:   03/04/2016     DOB: 1946/09/02   This 70 y.o. 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  REFERRING PHYSICIAN: Petra Kuba, MD  CHIEF COMPLAINT:  Chief Complaint  Patient presents with  . Cancer    evaluation for treatment to spine    DIAGNOSIS: The encounter diagnosis was Primary cervical cancer with metastasis to other site [facility]).   PREVIOUS INVESTIGATIONS:  PET CT scan and CT scans reviewed Pathology report reviewed Clinical notes reviewed  HPI: Patient is a 70 year old female originally presented back in February 2017 which was seen in the emergency room for nausea and failure to thrive. CT scan the abdomen and pelvis showed multiple retroperitoneal lymph nodes pelvic adenopathy including inguinal adenopathy as well as nodular contour the uterus with possible adnexal mass. Biopsy of inguinal node was positive for adenocarcinoma thought to be of endometrial origin. She had a PET CT scan showing hypermetabolic masses 70 seated with the and the knee treatment cervix extending to both sides of the uterus concerning for primary gynecologic malignancy such as endometrial adenocarcinoma. There is also hypermetabolic abdominal and pelvic lymph nodes as well as a lytic bone metastasis is L1 vertebral body. She also had small pulmonary nodules too small to Characterize but cannot rule out malignancy. She is currently being treated with Taxol Toprol keep T can and Avastin and she has done fairly well. She's been having increasing lower back pain I been asked to evaluate patient for possible palliative radiation therapy to her L1 vertebral body. She specifically denies any numbness or loss of motor strength in her lower extremities.  PLANNED TREATMENT REGIMEN: Palliative  radiation therapy to T12-L2 inclusive  PAST MEDICAL HISTORY:  has a past medical history of Hypertension; Carpal tunnel syndrome; Lymphadenopathy (10/25/15); Decreased appetite; Weight loss; Anxiety; Depression; Nausea & vomiting; Chemotherapy-induced nausea and vomiting; Decrease in appetite; Weight loss; Endometrial cancer ([place]) (11/06/2015); and Port-a-cath in place.    PAST SURGICAL HISTORY:  Past Surgical History  Procedure Laterality Date  . Carpal tunnel release Left   . Skin cancer excision    . Flexible sigmoidoscopy  05/2015    performed by Dr. Petra Kuba,  . Peripheral vascular catheterization N/A 02/25/2016    Procedure: Glori Luis Cath Insertion;  Surgeon: Katha Cabal, MD;  Location: [place] [facility];  Service: Cardiovascular;  Laterality: N/A;    FAMILY HISTORY: family history includes Breast cancer in her cousin, daughter, and sister; Diabetes in her maternal grandmother; Hypertension in her brother and sister; Ovarian cancer in her paternal aunt.  SOCIAL HISTORY:  reports that she has never smoked. She has never used smokeless tobacco. She reports that she does not drink alcohol or use illicit drugs.  ALLERGIES: Ibuprofen and Tape  MEDICATIONS:  Current Outpatient Prescriptions  Medication Sig Dispense Refill  . amLODipine (NORVASC) 10 MG tablet Take 1 tablet by mouth daily. Reported on 11/25/2015    . aspirin EC 81 MG tablet Take 81 mg by mouth daily. Reported on 01/31/2016    . dronabinol (MARINOL) 5 MG capsule Take 1 capsule (5 mg total) by mouth 2 (two) times daily before a meal. 60 capsule 1  . hydrochlorothiazide (HYDRODIURIL) 25 MG tablet Take 1 tablet by mouth daily. Reported on 01/31/2016    . HYDROcodone-acetaminophen (  NORCO/VICODIN) 5-325 MG tablet Take 1 tablet by mouth every 6 (six) hours as needed for moderate pain. 40 tablet 0  . lidocaine-prilocaine (EMLA) cream Apply 1 application topically as needed. To port site 30 g 0  . losartan (COZAAR) 100 MG  tablet Take 1 tablet by mouth daily. Reported on 11/25/2015    . ondansetron (ZOFRAN) 8 MG tablet Start 3 days after chemo 40 tablet 0  . predniSONE (DELTASONE) 20 MG tablet     . prochlorperazine (COMPAZINE) 10 MG tablet Take 1 tablet (10 mg total) by mouth every 6 (six) hours as needed for nausea or vomiting. 40 tablet 0  . promethazine (PHENERGAN) 25 MG tablet Take 1 tablet (25 mg total) by mouth every 6 (six) hours as needed for nausea or vomiting. 20 tablet 0   No current facility-administered medications for this encounter.   Facility-Administered Medications Ordered in Other Encounters  Medication Dose Route Frequency Provider Last Rate Last Dose  . 0.9 %  sodium chloride infusion   Intravenous Once Cammie Sickle, MD      . pegfilgrastim (NEULASTA ONPRO KIT) injection 6 mg  6 mg Subcutaneous Once Cammie Sickle, MD      . prochlorperazine (COMPAZINE) tablet 10 mg  10 mg Oral Once Cammie Sickle, MD      . topotecan (HYCAMTIN) 1.3 mg in sodium chloride 0.9 % 100 mL chemo infusion  0.75 mg/m2 (Treatment Plan Actual) Intravenous Once Cammie Sickle, MD        ECOG PERFORMANCE STATUS:  1 - Symptomatic but completely ambulatory  REVIEW OF SYSTEMS:  Patient denies any weight loss, fatigue, weakness, fever, chills or night sweats. Patient denies any loss of vision, blurred vision. Patient denies any ringing  of the ears or hearing loss. No irregular heartbeat. Patient denies heart murmur or history of fainting. Patient denies any chest pain or pain radiating to her upper extremities. Patient denies any shortness of breath, difficulty breathing at night, cough or hemoptysis. Patient denies any swelling in the lower legs. Patient denies any nausea vomiting, vomiting of blood, or coffee ground material in the vomitus. Patient denies any stomach pain. Patient states has had normal bowel movements no significant constipation or diarrhea. Patient denies any dysuria, hematuria or  significant nocturia. Patient denies any problems walking, swelling in the joints or loss of balance. Patient denies any skin changes, loss of hair or loss of weight. Patient denies any excessive worrying or anxiety or significant depression. Patient denies any problems with insomnia. Patient denies excessive thirst, polyuria, polydipsia. Patient denies any swollen glands, patient denies easy bruising or easy bleeding. Patient denies any recent infections, allergies or URI. Patient "s visual fields have not changed significantly in recent time.    PHYSICAL EXAM: BP 177/96 mmHg  Pulse 98  Temp(Src) 96 F (35.6 C)  Wt 139 lb 10.6 oz (63.35 kg) Motor sensory and DTR levels are equal and symmetric in the upper lower extremities. No pain is elicited on deep palpation of her spine. Proprioception is intact.  Well-developed well-nourished patient in NAD. HEENT reveals PERLA, EOMI, discs not visualized.  Oral cavity is clear. No oral mucosal lesions are identified. Neck is clear without evidence of cervical or supraclavicular adenopathy. Lungs are clear to A&P. Cardiac examination is essentially unremarkable with regular rate and rhythm without murmur rub or thrill. Abdomen is benign with no organomegaly or masses noted. Motor sensory and DTR levels are equal and symmetric in the upper and lower extremities.  Cranial nerves II through XII are grossly intact. Proprioception is intact. No peripheral adenopathy or edema is identified. No motor or sensory levels are noted. Crude visual fields are within normal range.  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 I will treat T12-L2 inclusive up to 3000 cGy in 10 fractions. Risks and benefits of treatment including possible  diarrhea fatigue skin reaction alteration of blood counts all were described in detail to the patient and her husband. They both seem to comprehend my treatment plan well.There will be extra effort by both professional staff as well as technical staff to coordinate and manage concurrent chemoradiation and ensuing side effects during his treatments. I personally ordered CT simulation for tomorrow.  I would like to take this opportunity to thank you for allowing me to participate in the care of your patient.Armstead Peaks., MD

## 2016-03-05 ENCOUNTER — Ambulatory Visit
Admission: RE | Admit: 2016-03-05 | Discharge: 2016-03-05 | Disposition: A | Discharge status: Home / Self Care | Payer: Medicare Other | Source: Ambulatory Visit | Attending: Radiation Oncology | Admitting: Radiation Oncology

## 2016-03-05 DIAGNOSIS — Z510 Encounter for antineoplastic radiation therapy: Secondary | ICD-10-CM | POA: Diagnosis not present

## 2016-03-06 DIAGNOSIS — Z510 Encounter for antineoplastic radiation therapy: Secondary | ICD-10-CM | POA: Diagnosis not present

## 2016-03-11 ENCOUNTER — Ambulatory Visit
Admission: RE | Admit: 2016-03-11 | Discharge: 2016-03-11 | Disposition: A | Discharge status: Home / Self Care | Payer: Medicare Other | Source: Ambulatory Visit | Attending: Radiation Oncology | Admitting: Radiation Oncology

## 2016-03-11 DIAGNOSIS — Z510 Encounter for antineoplastic radiation therapy: Secondary | ICD-10-CM | POA: Diagnosis not present

## 2016-03-12 ENCOUNTER — Other Ambulatory Visit: Payer: Self-pay

## 2016-03-12 ENCOUNTER — Inpatient Hospital Stay: Payer: Medicare Other | Attending: Internal Medicine

## 2016-03-12 ENCOUNTER — Inpatient Hospital Stay (HOSPITAL_BASED_OUTPATIENT_CLINIC_OR_DEPARTMENT_OTHER): Payer: Medicare Other | Admitting: Internal Medicine

## 2016-03-12 ENCOUNTER — Ambulatory Visit
Admission: RE | Admit: 2016-03-12 | Discharge: 2016-03-12 | Disposition: A | Discharge status: Home / Self Care | Payer: Medicare Other | Source: Ambulatory Visit | Attending: Radiation Oncology | Admitting: Radiation Oncology

## 2016-03-12 VITALS — BP 168/80 | HR 71 | Temp 98.0°F | Resp 18 | Wt 138.4 lb

## 2016-03-12 DIAGNOSIS — C539 Malignant neoplasm of cervix uteri, unspecified: Secondary | ICD-10-CM

## 2016-03-12 DIAGNOSIS — Z7982 Long term (current) use of aspirin: Secondary | ICD-10-CM | POA: Diagnosis not present

## 2016-03-12 DIAGNOSIS — Z803 Family history of malignant neoplasm of breast: Secondary | ICD-10-CM | POA: Insufficient documentation

## 2016-03-12 DIAGNOSIS — R630 Anorexia: Secondary | ICD-10-CM | POA: Insufficient documentation

## 2016-03-12 DIAGNOSIS — R200 Anesthesia of skin: Secondary | ICD-10-CM | POA: Insufficient documentation

## 2016-03-12 DIAGNOSIS — R202 Paresthesia of skin: Secondary | ICD-10-CM | POA: Diagnosis not present

## 2016-03-12 DIAGNOSIS — C530 Malignant neoplasm of endocervix: Secondary | ICD-10-CM | POA: Insufficient documentation

## 2016-03-12 DIAGNOSIS — M545 Low back pain: Secondary | ICD-10-CM

## 2016-03-12 DIAGNOSIS — I2510 Atherosclerotic heart disease of native coronary artery without angina pectoris: Secondary | ICD-10-CM | POA: Insufficient documentation

## 2016-03-12 DIAGNOSIS — Z79899 Other long term (current) drug therapy: Secondary | ICD-10-CM | POA: Diagnosis not present

## 2016-03-12 DIAGNOSIS — Z7689 Persons encountering health services in other specified circumstances: Secondary | ICD-10-CM | POA: Insufficient documentation

## 2016-03-12 DIAGNOSIS — R599 Enlarged lymph nodes, unspecified: Secondary | ICD-10-CM

## 2016-03-12 DIAGNOSIS — I10 Essential (primary) hypertension: Secondary | ICD-10-CM | POA: Diagnosis not present

## 2016-03-12 DIAGNOSIS — R634 Abnormal weight loss: Secondary | ICD-10-CM | POA: Insufficient documentation

## 2016-03-12 DIAGNOSIS — Z8041 Family history of malignant neoplasm of ovary: Secondary | ICD-10-CM | POA: Insufficient documentation

## 2016-03-12 DIAGNOSIS — C7800 Secondary malignant neoplasm of unspecified lung: Secondary | ICD-10-CM

## 2016-03-12 DIAGNOSIS — I700 Atherosclerosis of aorta: Secondary | ICD-10-CM | POA: Insufficient documentation

## 2016-03-12 DIAGNOSIS — G5600 Carpal tunnel syndrome, unspecified upper limb: Secondary | ICD-10-CM

## 2016-03-12 DIAGNOSIS — M549 Dorsalgia, unspecified: Secondary | ICD-10-CM

## 2016-03-12 DIAGNOSIS — Z5111 Encounter for antineoplastic chemotherapy: Secondary | ICD-10-CM | POA: Diagnosis not present

## 2016-03-12 DIAGNOSIS — Z510 Encounter for antineoplastic radiation therapy: Secondary | ICD-10-CM | POA: Diagnosis not present

## 2016-03-12 LAB — CBC WITH DIFFERENTIAL/PLATELET
BASOS PCT: 0 %
Basophils Absolute: 0.1 10*3/uL (ref 0–0.1)
EOS ABS: 0 10*3/uL (ref 0–0.7)
EOS PCT: 0 %
HCT: 28.3 % — ABNORMAL LOW (ref 35.0–47.0)
Hemoglobin: 9.2 g/dL — ABNORMAL LOW (ref 12.0–16.0)
Lymphocytes Relative: 14 %
Lymphs Abs: 1.8 10*3/uL (ref 1.0–3.6)
MCH: 28.7 pg (ref 26.0–34.0)
MCHC: 32.5 g/dL (ref 32.0–36.0)
MCV: 88.2 fL (ref 80.0–100.0)
MONO ABS: 0.5 10*3/uL (ref 0.2–0.9)
MONOS PCT: 4 %
NEUTROS PCT: 82 %
Neutro Abs: 10.2 10*3/uL — ABNORMAL HIGH (ref 1.4–6.5)
PLATELETS: 126 10*3/uL — AB (ref 150–440)
RBC: 3.21 MIL/uL — ABNORMAL LOW (ref 3.80–5.20)
RDW: 17.9 % — AB (ref 11.5–14.5)
WBC: 12.6 10*3/uL — ABNORMAL HIGH (ref 3.6–11.0)

## 2016-03-12 LAB — BASIC METABOLIC PANEL
Anion gap: 5 (ref 5–15)
BUN: 10 mg/dL (ref 6–20)
CALCIUM: 9 mg/dL (ref 8.9–10.3)
CO2: 25 mmol/L (ref 22–32)
CREATININE: 0.72 mg/dL (ref 0.44–1.00)
Chloride: 104 mmol/L (ref 101–111)
GFR calc non Af Amer: >60 mL/min (ref >60)
Glucose, Bld: 97 mg/dL (ref 65–99)
Potassium: 3.7 mmol/L (ref 3.5–5.1)
SODIUM: 134 mmol/L — AB (ref 135–145)

## 2016-03-12 NOTE — Assessment & Plan Note (Addendum)
Stage IV metastatic endocervical cancer currently on therapy with topotecan Taxol Avastin status post cycle #3 approximately 10 days ago.   Clinically no obvious progression noted; question improvement of left inguinal adenopathy. Await CT scan planned next Monday.   # Lumbar pain -start RT- started this morning; plan start x-geva at next visit.   #.Elevated BPs- Especially on Avastincall [place] back re: log Monday/10th.  # patient will follow-up with me on the 17th for chemotherapy cycle #4.

## 2016-03-12 NOTE — Progress Notes (Signed)
[place] OFFICE PROGRESS NOTE  Patient Care Team: Petra Kuba, MD as PCP - General (Family Medicine) Clent Jacks, RN as Registered Nurse  Primary cervical cancer with metastasis to other site [facility])   Staging form: Cervix Uteri, AJCC 7th Edition     Clinical: No stage assigned - Unsigned    Oncology History   # FEB 2017- STAGE IV ENDOCERVICAL vs endometrial ADENO CA [s/p Inguinal LN Bx; s/p endocervical Bx- pos; p16 pos? endocervical] Lymphadenopathy-RP/Ingiunal LN; CT-PET bil pul nodules [sub-cm]/ MRI- pelvis; March 10th- Carbo-taxol x3 cycles- MAY CT- PROGRESSION  # MAY 2017- TAXOL- TOPOTECAN-AVASTIN  # L-1spinous process lytic lesion [on PET]- not on X-geva.      Primary cervical cancer with metastasis to other site [facility])   01/16/2016 Initial Diagnosis Primary cervical cancer with metastasis to other site [facility])     INTERVAL HISTORY:  A very pleasant 70 year old female patient with above history of endocervical cancer currently on second line therapy with Taxol topotecan Avastin is here for follow-up. Patient received cycle #3 approximately 10 days ago  Patient's appetite is good. Nausea resolved. Mild tingling and numbness not any worse. With increasing back pain- patient has been evaluated by radiation oncology started radiation this morning.  Patient otherwise denies any headaches. Denies any nosebleeds or swelling of the legs.  She has noted improvement of her inguinal adenopathy.  REVIEW OF SYSTEMS:  A complete 10 point review of system is done which is negative except mentioned above/history of present illness.   PAST MEDICAL HISTORY :  Past Medical History  Diagnosis Date  . Hypertension   . Carpal tunnel syndrome   . Lymphadenopathy 10/25/15  . Decreased appetite   . Weight loss     30 lb weight loss since October 2016  . Anxiety   . Depression   . Nausea & vomiting   .  Chemotherapy-induced nausea and vomiting   . Decrease in appetite   . Weight loss   . Endometrial cancer ([place]) 11/06/2015  . Port-a-cath in place     PAST SURGICAL HISTORY :   Past Surgical History  Procedure Laterality Date  . Carpal tunnel release Left   . Skin cancer excision    . Flexible sigmoidoscopy  05/2015    performed by Dr. Petra Kuba,  . Peripheral vascular catheterization N/A 02/25/2016    Procedure: Glori Luis Cath Insertion;  Surgeon: Katha Cabal, MD;  Location: [place] [facility];  Service: Cardiovascular;  Laterality: N/A;    FAMILY HISTORY :   Family History  Problem Relation Age of Onset  . Breast cancer Sister   . Hypertension Sister   . Hypertension Brother   . Ovarian cancer Paternal Aunt   . Diabetes Maternal Grandmother   . Breast cancer Daughter   . Breast cancer Cousin     maternal    SOCIAL HISTORY:   Social History  Substance Use Topics  . Smoking status: Never Smoker   . Smokeless tobacco: Never Used  . Alcohol Use: No    ALLERGIES:  is allergic to ibuprofen and tape.  MEDICATIONS:  Current Outpatient Prescriptions  Medication Sig Dispense Refill  . amLODipine (NORVASC) 10 MG tablet Take 1 tablet by mouth daily. Reported on 11/25/2015    . aspirin EC 81 MG tablet Take 81 mg by mouth daily. Reported on 01/31/2016    . dronabinol (MARINOL) 5 MG capsule Take 1 capsule (5 mg  total) by mouth 2 (two) times daily before a meal. 60 capsule 1  . hydrochlorothiazide (HYDRODIURIL) 25 MG tablet Take 1 tablet by mouth daily. Reported on 01/31/2016    . HYDROcodone-acetaminophen (NORCO/VICODIN) 5-325 MG tablet Take 1 tablet by mouth every 6 (six) hours as needed for moderate pain. 40 tablet 0  . lidocaine-prilocaine (EMLA) cream Apply 1 application topically as needed. To port site 30 g 0  . losartan (COZAAR) 100 MG tablet Take 1 tablet by mouth daily. Reported on 11/25/2015    . ondansetron (ZOFRAN) 8 MG tablet Start 3 days after chemo 40 tablet 0   . predniSONE (DELTASONE) 20 MG tablet     . prochlorperazine (COMPAZINE) 10 MG tablet Take 1 tablet (10 mg total) by mouth every 6 (six) hours as needed for nausea or vomiting. 40 tablet 0  . promethazine (PHENERGAN) 25 MG tablet Take 1 tablet (25 mg total) by mouth every 6 (six) hours as needed for nausea or vomiting. 20 tablet 0   No current facility-administered medications for this visit.    PHYSICAL EXAMINATION: ECOG PERFORMANCE STATUS: 1 - Symptomatic but completely ambulatory  BP 168/80 mmHg  Pulse 71  Temp(Src) 98 F (36.7 C) (Tympanic)  Resp 18  Wt 138 lb 7.2 oz (62.8 kg)  Filed Weights   03/12/16 1434  Weight: 138 lb 7.2 oz (62.8 kg)    GENERAL:Thin built moderately nourished female patient  Alert, no distress and comfortable.   Accompanied by husband.S: no pallor or icterus OROPHARYNX: no thrush or ulceration; good dentition  NECK: supple, no masses felt LYMPH:  no palpable lymphadenopathy in the cervical, axillary region. Left inguinal region 1.5-2 cm lymph node felt.  LUNGS: clear to auscultation and  No wheeze or crackles HEART/CVS: regular rate & rhythm and no murmurs; No lower extremity edema ABDOMEN:abdomen soft, non-tender and normal bowel sounds Musculoskeletal:no cyanosis of digits and no clubbing  PSYCH: alert & oriented x 3 with fluent speech NEURO: no focal motor/sensory deficits SKIN:  no rashes or significant lesions  LABORATORY DATA:  I have reviewed the data as listed    Component Value Date/Time   NA 134* 03/12/2016 1350   K 3.7 03/12/2016 1350   CL 104 03/12/2016 1350   CO2 25 03/12/2016 1350   GLUCOSE 97 03/12/2016 1350   BUN 10 03/12/2016 1350   CREATININE 0.72 03/12/2016 1350   CALCIUM 9.0 03/12/2016 1350   PROT 7.7 03/02/2016 0814   ALBUMIN 3.0* 03/02/2016 0814   AST 16 03/02/2016 0814   ALT 8* 03/02/2016 0814   ALKPHOS 78 03/02/2016 0814   BILITOT 0.5 03/02/2016 0814   GFRNONAA >60 03/12/2016 1350   GFRAA >60 03/12/2016 1350     No results found for: SPEP, UPEP  Lab Results  Component Value Date   WBC 12.6* 03/12/2016   NEUTROABS 10.2* 03/12/2016   HGB 9.2* 03/12/2016   HCT 28.3* 03/12/2016   MCV 88.2 03/12/2016   PLT 126* 03/12/2016      Chemistry      Component Value Date/Time   NA 134* 03/12/2016 1350   K 3.7 03/12/2016 1350   CL 104 03/12/2016 1350   CO2 25 03/12/2016 1350   BUN 10 03/12/2016 1350   CREATININE 0.72 03/12/2016 1350      Component Value Date/Time   CALCIUM 9.0 03/12/2016 1350   ALKPHOS 78 03/02/2016 0814   AST 16 03/02/2016 0814   ALT 8* 03/02/2016 0814   BILITOT 0.5 03/02/2016 [id number]  RADIOGRAPHIC STUDIES: I have personally reviewed the radiological images as listed and agreed with the findings in the report. No results found.   ASSESSMENT & PLAN:  Primary cervical cancer with metastasis to other site [facility]) Stage IV metastatic endocervical cancer currently on therapy with topotecan Taxol Avastin status post cycle #3 approximately 10 days ago.   Clinically no obvious progression noted; question improvement of left inguinal adenopathy. Await CT scan planned next Monday.   # Lumbar pain -start RT- started this morning; plan start x-geva at next visit.   #.Elevated BPs- Especially on Avastincall [place] back re: log Monday/10th.  # patient will follow-up with me on the 17th for chemotherapy cycle #4.    No orders of the defined types were placed in this encounter.   All questions were answered. The patient knows to call the clinic with any problems, questions or concerns    Cammie Sickle, MD 03/12/2016 6:32 PM        Cammie Sickle, MD 03/12/2016 6:32 PM

## 2016-03-13 ENCOUNTER — Ambulatory Visit
Admission: RE | Admit: 2016-03-13 | Discharge: 2016-03-13 | Disposition: A | Discharge status: Home / Self Care | Payer: Medicare Other | Source: Ambulatory Visit | Attending: Radiation Oncology | Admitting: Radiation Oncology

## 2016-03-13 DIAGNOSIS — Z510 Encounter for antineoplastic radiation therapy: Secondary | ICD-10-CM | POA: Diagnosis not present

## 2016-03-16 ENCOUNTER — Ambulatory Visit
Admission: RE | Admit: 2016-03-16 | Discharge: 2016-03-16 | Disposition: A | Discharge status: Home / Self Care | Payer: Medicare Other | Source: Ambulatory Visit | Attending: Internal Medicine | Admitting: Internal Medicine

## 2016-03-16 ENCOUNTER — Ambulatory Visit
Admission: RE | Admit: 2016-03-16 | Discharge: 2016-03-16 | Disposition: A | Discharge status: Home / Self Care | Payer: Medicare Other | Source: Ambulatory Visit | Attending: Radiation Oncology | Admitting: Radiation Oncology

## 2016-03-16 DIAGNOSIS — I2510 Atherosclerotic heart disease of native coronary artery without angina pectoris: Secondary | ICD-10-CM | POA: Diagnosis not present

## 2016-03-16 DIAGNOSIS — C7800 Secondary malignant neoplasm of unspecified lung: Secondary | ICD-10-CM | POA: Diagnosis not present

## 2016-03-16 DIAGNOSIS — C539 Malignant neoplasm of cervix uteri, unspecified: Secondary | ICD-10-CM | POA: Insufficient documentation

## 2016-03-16 DIAGNOSIS — C775 Secondary and unspecified malignant neoplasm of intrapelvic lymph nodes: Secondary | ICD-10-CM | POA: Diagnosis not present

## 2016-03-16 DIAGNOSIS — C799 Secondary malignant neoplasm of unspecified site: Secondary | ICD-10-CM | POA: Diagnosis present

## 2016-03-16 DIAGNOSIS — I700 Atherosclerosis of aorta: Secondary | ICD-10-CM | POA: Diagnosis not present

## 2016-03-16 DIAGNOSIS — Z510 Encounter for antineoplastic radiation therapy: Secondary | ICD-10-CM | POA: Diagnosis not present

## 2016-03-16 MED ORDER — IOPAMIDOL (ISOVUE-300) INJECTION 61%
85.0000 mL | Freq: Once | INTRAVENOUS | Status: AC | PRN
  Administered 2016-03-16: 85 mL via INTRAVENOUS

## 2016-03-17 ENCOUNTER — Ambulatory Visit
Admission: RE | Admit: 2016-03-17 | Discharge: 2016-03-17 | Disposition: A | Discharge status: Home / Self Care | Payer: Medicare Other | Source: Ambulatory Visit | Attending: Radiation Oncology | Admitting: Radiation Oncology

## 2016-03-17 DIAGNOSIS — Z510 Encounter for antineoplastic radiation therapy: Secondary | ICD-10-CM | POA: Diagnosis not present

## 2016-03-18 ENCOUNTER — Ambulatory Visit
Admission: RE | Admit: 2016-03-18 | Discharge: 2016-03-18 | Disposition: A | Discharge status: Home / Self Care | Payer: Medicare Other | Source: Ambulatory Visit | Attending: Radiation Oncology | Admitting: Radiation Oncology

## 2016-03-18 DIAGNOSIS — Z510 Encounter for antineoplastic radiation therapy: Secondary | ICD-10-CM | POA: Diagnosis not present

## 2016-03-19 ENCOUNTER — Ambulatory Visit
Admission: RE | Admit: 2016-03-19 | Discharge: 2016-03-19 | Disposition: A | Discharge status: Home / Self Care | Payer: Medicare Other | Source: Ambulatory Visit | Attending: Radiation Oncology | Admitting: Radiation Oncology

## 2016-03-19 DIAGNOSIS — Z510 Encounter for antineoplastic radiation therapy: Secondary | ICD-10-CM | POA: Diagnosis not present

## 2016-03-20 ENCOUNTER — Ambulatory Visit
Admission: RE | Admit: 2016-03-20 | Discharge: 2016-03-20 | Disposition: A | Discharge status: Home / Self Care | Payer: Medicare Other | Source: Ambulatory Visit | Attending: Radiation Oncology | Admitting: Radiation Oncology

## 2016-03-20 DIAGNOSIS — Z510 Encounter for antineoplastic radiation therapy: Secondary | ICD-10-CM | POA: Diagnosis not present

## 2016-03-23 ENCOUNTER — Inpatient Hospital Stay (HOSPITAL_BASED_OUTPATIENT_CLINIC_OR_DEPARTMENT_OTHER): Payer: Medicare Other | Admitting: Internal Medicine

## 2016-03-23 ENCOUNTER — Inpatient Hospital Stay: Payer: Medicare Other

## 2016-03-23 ENCOUNTER — Ambulatory Visit
Admission: RE | Admit: 2016-03-23 | Discharge: 2016-03-23 | Disposition: A | Discharge status: Home / Self Care | Payer: Medicare Other | Source: Ambulatory Visit | Attending: Radiation Oncology | Admitting: Radiation Oncology

## 2016-03-23 VITALS — BP 130/72 | HR 88 | Resp 20

## 2016-03-23 VITALS — BP 131/84 | HR 90 | Temp 96.2°F | Resp 18 | Wt 134.5 lb

## 2016-03-23 DIAGNOSIS — Z803 Family history of malignant neoplasm of breast: Secondary | ICD-10-CM

## 2016-03-23 DIAGNOSIS — Z8041 Family history of malignant neoplasm of ovary: Secondary | ICD-10-CM

## 2016-03-23 DIAGNOSIS — M549 Dorsalgia, unspecified: Secondary | ICD-10-CM

## 2016-03-23 DIAGNOSIS — M545 Low back pain: Secondary | ICD-10-CM

## 2016-03-23 DIAGNOSIS — R634 Abnormal weight loss: Secondary | ICD-10-CM | POA: Insufficient documentation

## 2016-03-23 DIAGNOSIS — I700 Atherosclerosis of aorta: Secondary | ICD-10-CM

## 2016-03-23 DIAGNOSIS — Z510 Encounter for antineoplastic radiation therapy: Secondary | ICD-10-CM | POA: Diagnosis not present

## 2016-03-23 DIAGNOSIS — C530 Malignant neoplasm of endocervix: Secondary | ICD-10-CM | POA: Diagnosis not present

## 2016-03-23 DIAGNOSIS — C539 Malignant neoplasm of cervix uteri, unspecified: Secondary | ICD-10-CM

## 2016-03-23 DIAGNOSIS — I10 Essential (primary) hypertension: Secondary | ICD-10-CM | POA: Diagnosis not present

## 2016-03-23 DIAGNOSIS — C7800 Secondary malignant neoplasm of unspecified lung: Secondary | ICD-10-CM | POA: Diagnosis not present

## 2016-03-23 DIAGNOSIS — Z5111 Encounter for antineoplastic chemotherapy: Secondary | ICD-10-CM | POA: Diagnosis not present

## 2016-03-23 DIAGNOSIS — I2510 Atherosclerotic heart disease of native coronary artery without angina pectoris: Secondary | ICD-10-CM

## 2016-03-23 DIAGNOSIS — R599 Enlarged lymph nodes, unspecified: Secondary | ICD-10-CM

## 2016-03-23 DIAGNOSIS — R630 Anorexia: Secondary | ICD-10-CM

## 2016-03-23 DIAGNOSIS — R202 Paresthesia of skin: Secondary | ICD-10-CM

## 2016-03-23 DIAGNOSIS — C541 Malignant neoplasm of endometrium: Secondary | ICD-10-CM

## 2016-03-23 DIAGNOSIS — R200 Anesthesia of skin: Secondary | ICD-10-CM

## 2016-03-23 DIAGNOSIS — G5600 Carpal tunnel syndrome, unspecified upper limb: Secondary | ICD-10-CM

## 2016-03-23 DIAGNOSIS — Z79899 Other long term (current) drug therapy: Secondary | ICD-10-CM

## 2016-03-23 DIAGNOSIS — Z7982 Long term (current) use of aspirin: Secondary | ICD-10-CM

## 2016-03-23 LAB — COMPREHENSIVE METABOLIC PANEL
ALBUMIN: 3.6 g/dL (ref 3.5–5.0)
ALK PHOS: 89 U/L (ref 38–126)
ALT: 12 U/L — ABNORMAL LOW (ref 14–54)
ANION GAP: 6 (ref 5–15)
AST: 20 U/L (ref 15–41)
BUN: 23 mg/dL — ABNORMAL HIGH (ref 6–20)
CALCIUM: 9.1 mg/dL (ref 8.9–10.3)
CHLORIDE: 100 mmol/L — AB (ref 101–111)
CO2: 25 mmol/L (ref 22–32)
Creatinine, Ser: 0.79 mg/dL (ref 0.44–1.00)
GFR calc non Af Amer: >60 mL/min (ref >60)
GLUCOSE: 148 mg/dL — AB (ref 65–99)
POTASSIUM: 3.8 mmol/L (ref 3.5–5.1)
SODIUM: 131 mmol/L — AB (ref 135–145)
Total Bilirubin: 0.5 mg/dL (ref 0.3–1.2)
Total Protein: 7.9 g/dL (ref 6.5–8.1)

## 2016-03-23 LAB — CBC WITH DIFFERENTIAL/PLATELET
BASOS ABS: 0 10*3/uL (ref 0–0.1)
Basophils Relative: 1 %
EOS PCT: 1 %
Eosinophils Absolute: 0.1 10*3/uL (ref 0–0.7)
HCT: 30.3 % — ABNORMAL LOW (ref 35.0–47.0)
Hemoglobin: 10.2 g/dL — ABNORMAL LOW (ref 12.0–16.0)
LYMPHS PCT: 8 %
Lymphs Abs: 0.5 10*3/uL — ABNORMAL LOW (ref 1.0–3.6)
MCH: 29.7 pg (ref 26.0–34.0)
MCHC: 33.7 g/dL (ref 32.0–36.0)
MCV: 88.3 fL (ref 80.0–100.0)
Monocytes Absolute: 0.8 10*3/uL (ref 0.2–0.9)
Monocytes Relative: 12 %
Neutro Abs: 5.3 10*3/uL (ref 1.4–6.5)
Neutrophils Relative %: 78 %
PLATELETS: 352 10*3/uL (ref 150–440)
RBC: 3.43 MIL/uL — AB (ref 3.80–5.20)
RDW: 18.6 % — ABNORMAL HIGH (ref 11.5–14.5)
WBC: 6.8 10*3/uL (ref 3.6–11.0)

## 2016-03-23 MED ORDER — SODIUM CHLORIDE 0.9 % IV SOLN
20.0000 mg | Freq: Once | INTRAVENOUS | Status: AC
  Administered 2016-03-23: 20 mg via INTRAVENOUS
  Filled 2016-03-23: qty 2

## 2016-03-23 MED ORDER — FAMOTIDINE IN NACL 20-0.9 MG/50ML-% IV SOLN
20.0000 mg | Freq: Two times a day (BID) | INTRAVENOUS | Status: DC
  Administered 2016-03-23: 20 mg via INTRAVENOUS
  Filled 2016-03-23: qty 50

## 2016-03-23 MED ORDER — SODIUM CHLORIDE 0.9 % IV SOLN
15.0000 mg/kg | Freq: Once | INTRAVENOUS | Status: AC
  Administered 2016-03-23: 950 mg via INTRAVENOUS
  Filled 2016-03-23: qty 32

## 2016-03-23 MED ORDER — SODIUM CHLORIDE 0.9 % IV SOLN
312.0000 mg | Freq: Once | INTRAVENOUS | Status: AC
  Administered 2016-03-23: 312 mg via INTRAVENOUS
  Filled 2016-03-23: qty 52

## 2016-03-23 MED ORDER — HEPARIN SOD (PORK) LOCK FLUSH 100 UNIT/ML IV SOLN
500.0000 [IU] | Freq: Once | INTRAVENOUS | Status: AC | PRN
  Administered 2016-03-23: 500 [IU]
  Filled 2016-03-23: qty 5

## 2016-03-23 MED ORDER — PROCHLORPERAZINE MALEATE 10 MG PO TABS
10.0000 mg | ORAL_TABLET | Freq: Once | ORAL | Status: AC
  Administered 2016-03-23: 10 mg via ORAL
  Filled 2016-03-23: qty 1

## 2016-03-23 MED ORDER — SODIUM CHLORIDE 0.9 % IV SOLN
Freq: Once | INTRAVENOUS | Status: AC
  Administered 2016-03-23: 10:00:00 via INTRAVENOUS
  Filled 2016-03-23: qty 1000

## 2016-03-23 MED ORDER — DIPHENHYDRAMINE HCL 50 MG/ML IJ SOLN
50.0000 mg | Freq: Once | INTRAMUSCULAR | Status: AC
  Administered 2016-03-23: 50 mg via INTRAVENOUS
  Filled 2016-03-23: qty 1

## 2016-03-23 MED ORDER — TOPOTECAN HCL CHEMO INJECTION 4 MG
0.7500 mg/m2 | Freq: Once | INTRAVENOUS | Status: AC
  Administered 2016-03-23: 1.3 mg via INTRAVENOUS
  Filled 2016-03-23: qty 1.3

## 2016-03-23 MED ORDER — SODIUM CHLORIDE 0.9 % IV SOLN
Freq: Once | INTRAVENOUS | Status: AC
  Filled 2016-03-23: qty 1000

## 2016-03-23 NOTE — Assessment & Plan Note (Addendum)
Stage IV metastatic endocervical cancer currently on therapy with topotecan Taxol Avastin status post cycle #3; July 10th CT- PR. Patient/family interested in surgery; I think this is less likely given continued metastatic disease noted in the lung. However we will check with Dr.Berchuck.   # Continue Taxol-Topotecan- Avastin. Labs reviewed.  # Lumbar pain -on RT; improved. plan start x-geva today; q 6 W. Recommend taking ca+cit D BID  #.Elevated BPs-sec to avastin-  better controlled.   # weight loss- recommend dietary referral.  # follow up with me in 3 weeks/cbc-cmp/UA.    I reviewed the images myself and with the patient and family in detail. A copy of this report was given.

## 2016-03-23 NOTE — Progress Notes (Signed)
Patient here today for CT results. 

## 2016-03-23 NOTE — Progress Notes (Signed)
[place] OFFICE PROGRESS NOTE  Patient Care Team: Petra Kuba, MD as PCP - General (Family Medicine) Clent Jacks, RN as Registered Nurse  Primary cervical cancer with metastasis to other site [facility])   Staging form: Cervix Uteri, AJCC 7th Edition     Clinical: No stage assigned - Unsigned    Oncology History   # FEB 2017- STAGE IV ENDOCERVICAL vs endometrial ADENO CA [s/p Inguinal LN Bx; s/p endocervical Bx- pos; p16 pos? endocervical] Lymphadenopathy-RP/Ingiunal LN; CT-PET bil pul nodules [sub-cm]/ MRI- pelvis; March 10th- Carbo-taxol x3 cycles- MAY CT- PROGRESSION  # MAY 2017- TAXOL- TOPOTECAN-AVASTIN; 03/16/2016-CT-Partial Response  # L-1spinous process lytic lesion [on PET]- not on X-geva.    # MOLECULAR TESTING: MSI- STABLE     Primary cervical cancer with metastasis to other site [facility])     INTERVAL HISTORY:  A very pleasant 70 year old female patient with above history of endocervical cancer currently on second line therapy with Taxol topotecan Avastin is here for follow-up. Patient received cycle #3 approximately 3 weeks ago/Is here to review the results of the restaging CAT scan  Patient's back pain has improved since starting radiation. She has 2 more fractions to go. She has poor appetite. She has not been drinking boost as recommended. Lost about 4 pounds.  Nausea resolved. Mild tingling and numbness not any worse.   Patient otherwise denies any headaches. Denies any nosebleeds or swelling of the legs.  She has noted improvement of her inguinal adenopathy.  REVIEW OF SYSTEMS:  A complete 10 point review of system is done which is negative except mentioned above/history of present illness.   PAST MEDICAL HISTORY :  Past Medical History  Diagnosis Date  . Hypertension   . Carpal tunnel syndrome   . Lymphadenopathy 10/25/15  . Decreased appetite   . Weight loss     30 lb weight loss  since October 2016  . Anxiety   . Depression   . Nausea & vomiting   . Chemotherapy-induced nausea and vomiting   . Decrease in appetite   . Weight loss   . Port-a-cath in place   . Endometrial cancer ([place]) 11/06/2015    PAST SURGICAL HISTORY :   Past Surgical History  Procedure Laterality Date  . Carpal tunnel release Left   . Skin cancer excision    . Flexible sigmoidoscopy  05/2015    performed by Dr. Petra Kuba,  . Peripheral vascular catheterization N/A 02/25/2016    Procedure: Glori Luis Cath Insertion;  Surgeon: Katha Cabal, MD;  Location: [place] CV LAB;  Service: Cardiovascular;  Laterality: N/A;    FAMILY HISTORY :   Family History  Problem Relation Age of Onset  . Breast cancer Sister   . Hypertension Sister   . Hypertension Brother   . Ovarian cancer Paternal Aunt   . Diabetes Maternal Grandmother   . Breast cancer Daughter   . Breast cancer Cousin     maternal    SOCIAL HISTORY:   Social History  Substance Use Topics  . Smoking status: Never Smoker   . Smokeless tobacco: Never Used  . Alcohol Use: No    ALLERGIES:  is allergic to ibuprofen and tape.  MEDICATIONS:  Current Outpatient Prescriptions  Medication Sig Dispense Refill  . amLODipine (NORVASC) 10 MG tablet Take 1 tablet by mouth daily. Reported on 11/25/2015    . aspirin EC 81 MG tablet Take 81 mg by  mouth daily. Reported on 01/31/2016    . dronabinol (MARINOL) 5 MG capsule Take 1 capsule (5 mg total) by mouth 2 (two) times daily before a meal. 60 capsule 1  . hydrochlorothiazide (HYDRODIURIL) 25 MG tablet Take 1 tablet by mouth daily. Reported on 01/31/2016    . HYDROcodone-acetaminophen (NORCO/VICODIN) 5-325 MG tablet Take 1 tablet by mouth every 6 (six) hours as needed for moderate pain. 40 tablet 0  . lidocaine-prilocaine (EMLA) cream Apply 1 application topically as needed. To port site 30 g 0  . losartan (COZAAR) 100 MG tablet Take 1 tablet by mouth daily. Reported on 11/25/2015     . ondansetron (ZOFRAN) 8 MG tablet Start 3 days after chemo 40 tablet 0  . predniSONE (DELTASONE) 20 MG tablet     . prochlorperazine (COMPAZINE) 10 MG tablet Take 1 tablet (10 mg total) by mouth every 6 (six) hours as needed for nausea or vomiting. 40 tablet 0  . promethazine (PHENERGAN) 25 MG tablet Take 1 tablet (25 mg total) by mouth every 6 (six) hours as needed for nausea or vomiting. 20 tablet 0   No current facility-administered medications for this visit.   Facility-Administered Medications Ordered in Other Visits  Medication Dose Route Frequency Provider Last Rate Last Dose  . famotidine (PEPCID) IVPB 20 mg premix  20 mg Intravenous Q12H Cammie Sickle, MD   20 mg at 03/23/16 1015  . heparin lock flush 100 unit/mL  500 Units Intracatheter Once PRN Cammie Sickle, MD      . PACLitaxel (TAXOL) 312 mg in sodium chloride 0.9 % 500 mL chemo infusion (> 52m/m2)  312 mg Intravenous Once GCammie Sickle MD 184 mL/hr at 03/23/16 1203 312 mg at 03/23/16 1203  . topotecan (HYCAMTIN) 1.3 mg in sodium chloride 0.9 % 100 mL chemo infusion  0.75 mg/m2 (Treatment Plan Actual) Intravenous Once GCammie Sickle MD        PHYSICAL EXAMINATION: ECOG PERFORMANCE STATUS: 1 - Symptomatic but completely ambulatory  BP 131/84 mmHg  Pulse 90  Temp(Src) 96.2 F (35.7 C) (Tympanic)  Resp 18  Wt 134 lb 8 oz (61.009 kg)  Filed Weights   03/23/16 0854  Weight: 134 lb 8 oz (61.009 kg)    GENERAL:Thin built moderately nourished female patient  Alert, no distress and comfortable.   Accompanied by husband.S: no pallor or icterus OROPHARYNX: no thrush or ulceration; good dentition  NECK: supple, no masses felt LYMPH:  no palpable lymphadenopathy in the cervical, axillary region. Left inguinal region 1.5-2 cm lymph node felt.  LUNGS: clear to auscultation and  No wheeze or crackles HEART/CVS: regular rate & rhythm and no murmurs; No lower extremity edema ABDOMEN:abdomen soft,  non-tender and normal bowel sounds Musculoskeletal:no cyanosis of digits and no clubbing  PSYCH: alert & oriented x 3 with fluent speech NEURO: no focal motor/sensory deficits SKIN:  no rashes or significant lesions  LABORATORY DATA:  I have reviewed the data as listed    Component Value Date/Time   NA 131* 03/23/2016 0834   K 3.8 03/23/2016 0834   CL 100* 03/23/2016 0834   CO2 25 03/23/2016 0834   GLUCOSE 148* 03/23/2016 0834   BUN 23* 03/23/2016 0834   CREATININE 0.79 03/23/2016 0834   CALCIUM 9.1 03/23/2016 0834   PROT 7.9 03/23/2016 0834   ALBUMIN 3.6 03/23/2016 0834   AST 20 03/23/2016 0834   ALT 12* 03/23/2016 0834   ALKPHOS 89 03/23/2016 0834   BILITOT 0.5 03/23/2016 06962  GFRNONAA >60 03/23/2016 0834   GFRAA >60 03/23/2016 0834    No results found for: SPEP, UPEP  Lab Results  Component Value Date   WBC 6.8 03/23/2016   NEUTROABS 5.3 03/23/2016   HGB 10.2* 03/23/2016   HCT 30.3* 03/23/2016   MCV 88.3 03/23/2016   PLT 352 03/23/2016      Chemistry      Component Value Date/Time   NA 131* 03/23/2016 0834   K 3.8 03/23/2016 0834   CL 100* 03/23/2016 0834   CO2 25 03/23/2016 0834   BUN 23* 03/23/2016 0834   CREATININE 0.79 03/23/2016 0834      Component Value Date/Time   CALCIUM 9.1 03/23/2016 0834   ALKPHOS 89 03/23/2016 0834   AST 20 03/23/2016 0834   ALT 12* 03/23/2016 0834   BILITOT 0.5 03/23/2016 0834     IMPRESSION: 1. Partial interval treatment response. Pulmonary metastases, retroperitoneal and bilateral pelvic nodal metastases, endocervical mass, bilateral adnexal masses and appendiceal tip serosal implants are all mildly decreased in size. No new or progressive metastatic disease. 2. Additional findings include coronary atherosclerosis and aortic atherosclerosis.   Electronically Signed  By: Ilona Sorrel M.D.  On: 03/16/2016 15:40  RADIOGRAPHIC STUDIES: I have personally reviewed the radiological images as listed and agreed  with the findings in the report. No results found.   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 Patient/family interested in surgery; I think this is less likely given continued metastatic disease noted in the lung. However we will check with Dr.Berchuck.   # Continue Taxol-Topotecan- Avastin. Labs reviewed.  # Lumbar pain -on RT; improved. plan start x-geva today; q 6 W. Recommend taking ca+cit D BID  #.Elevated BPs-sec to avastin-  better controlled.   # weight loss- recommend dietary referral.  # follow up with me in 3 weeks/cbc-cmp/UA.     Orders Placed This Encounter  Procedures  . Comprehensive metabolic panel    Standing Status: Standing     Number of Occurrences: 4     Standing Expiration Date: 03/23/2017    Order Specific Question:  Has the patient fasted?    Answer:  No  . CBC with Differential    Standing Status: Standing     Number of Occurrences: 5     Standing Expiration Date: 03/23/2017  . Protein Electro, Random Urine    Standing Status: Standing     Number of Occurrences: 6     Standing Expiration Date: 03/23/2017  . Amb Referral to Nutrition and Diabetic E    Referral Priority:  Urgent    Referral Type:  Consultation    Referral Reason:  Specialty Services Required    Number of Visits Requested:  1   All questions were answered. The patient knows to call the clinic with any problems, questions or concerns    Cammie Sickle, MD 03/23/2016 1:33 PM        Cammie Sickle, MD 03/23/2016 1:33 PM

## 2016-03-24 ENCOUNTER — Inpatient Hospital Stay: Payer: Medicare Other

## 2016-03-24 ENCOUNTER — Ambulatory Visit
Admission: RE | Admit: 2016-03-24 | Discharge: 2016-03-24 | Disposition: A | Discharge status: Home / Self Care | Payer: Medicare Other | Source: Ambulatory Visit | Attending: Radiation Oncology | Admitting: Radiation Oncology

## 2016-03-24 VITALS — BP 134/76 | HR 74 | Resp 20

## 2016-03-24 DIAGNOSIS — Z5111 Encounter for antineoplastic chemotherapy: Secondary | ICD-10-CM | POA: Diagnosis not present

## 2016-03-24 DIAGNOSIS — Z510 Encounter for antineoplastic radiation therapy: Secondary | ICD-10-CM | POA: Diagnosis not present

## 2016-03-24 DIAGNOSIS — C539 Malignant neoplasm of cervix uteri, unspecified: Secondary | ICD-10-CM

## 2016-03-24 MED ORDER — TOPOTECAN HCL CHEMO INJECTION 4 MG
0.7500 mg/m2 | Freq: Once | INTRAVENOUS | Status: AC
  Administered 2016-03-24: 1.3 mg via INTRAVENOUS
  Filled 2016-03-24: qty 1.3

## 2016-03-24 MED ORDER — PROCHLORPERAZINE MALEATE 10 MG PO TABS
10.0000 mg | ORAL_TABLET | Freq: Once | ORAL | Status: AC
  Administered 2016-03-24: 10 mg via ORAL
  Filled 2016-03-24: qty 1

## 2016-03-24 MED ORDER — HEPARIN SOD (PORK) LOCK FLUSH 100 UNIT/ML IV SOLN
500.0000 [IU] | Freq: Once | INTRAVENOUS | Status: AC
  Administered 2016-03-24: 500 [IU] via INTRAVENOUS

## 2016-03-24 MED ORDER — SODIUM CHLORIDE 0.9 % IV SOLN
Freq: Once | INTRAVENOUS | Status: AC
  Administered 2016-03-24: 14:00:00 via INTRAVENOUS
  Filled 2016-03-24: qty 1000

## 2016-03-24 MED ORDER — HEPARIN SOD (PORK) LOCK FLUSH 100 UNIT/ML IV SOLN
INTRAVENOUS | Status: AC
  Filled 2016-03-24: qty 5

## 2016-03-24 MED ORDER — SODIUM CHLORIDE 0.9% FLUSH
10.0000 mL | INTRAVENOUS | Status: DC | PRN
  Administered 2016-03-24: 10 mL via INTRAVENOUS
  Filled 2016-03-24: qty 10

## 2016-03-25 ENCOUNTER — Ambulatory Visit
Admission: RE | Admit: 2016-03-25 | Discharge: 2016-03-25 | Disposition: A | Discharge status: Home / Self Care | Payer: Medicare Other | Source: Ambulatory Visit | Attending: Radiation Oncology | Admitting: Radiation Oncology

## 2016-03-25 ENCOUNTER — Inpatient Hospital Stay: Payer: Medicare Other

## 2016-03-25 DIAGNOSIS — Z5111 Encounter for antineoplastic chemotherapy: Secondary | ICD-10-CM | POA: Diagnosis not present

## 2016-03-25 DIAGNOSIS — C539 Malignant neoplasm of cervix uteri, unspecified: Secondary | ICD-10-CM

## 2016-03-25 DIAGNOSIS — Z510 Encounter for antineoplastic radiation therapy: Secondary | ICD-10-CM | POA: Diagnosis not present

## 2016-03-25 MED ORDER — PROCHLORPERAZINE MALEATE 10 MG PO TABS
10.0000 mg | ORAL_TABLET | Freq: Once | ORAL | Status: AC
  Administered 2016-03-25: 10 mg via ORAL
  Filled 2016-03-25: qty 1

## 2016-03-25 MED ORDER — PEGFILGRASTIM 6 MG/0.6ML ~~LOC~~ PSKT
6.0000 mg | PREFILLED_SYRINGE | Freq: Once | SUBCUTANEOUS | Status: AC
  Administered 2016-03-25: 6 mg via SUBCUTANEOUS
  Filled 2016-03-25: qty 0.6

## 2016-03-25 MED ORDER — TOPOTECAN HCL CHEMO INJECTION 4 MG
0.7500 mg/m2 | Freq: Once | INTRAVENOUS | Status: AC
  Administered 2016-03-25: 1.3 mg via INTRAVENOUS
  Filled 2016-03-25: qty 1.3

## 2016-03-25 MED ORDER — HEPARIN SOD (PORK) LOCK FLUSH 100 UNIT/ML IV SOLN
500.0000 [IU] | Freq: Once | INTRAVENOUS | Status: AC | PRN
  Administered 2016-03-25: 500 [IU]

## 2016-03-25 MED ORDER — SODIUM CHLORIDE 0.9 % IV SOLN
Freq: Once | INTRAVENOUS | Status: AC
  Administered 2016-03-25: 14:00:00 via INTRAVENOUS
  Filled 2016-03-25: qty 1000

## 2016-04-06 ENCOUNTER — Encounter: Payer: Self-pay | Admitting: Dietician

## 2016-04-06 ENCOUNTER — Encounter: Payer: Medicare Other | Attending: Internal Medicine | Admitting: Dietician

## 2016-04-06 VITALS — Ht 67.0 in | Wt 136.1 lb

## 2016-04-06 DIAGNOSIS — C539 Malignant neoplasm of cervix uteri, unspecified: Secondary | ICD-10-CM

## 2016-04-06 NOTE — Patient Instructions (Signed)
   Add some extra calories in foods by adding some dry milk or protein powder, peanut butter or peanut butter powder (like PB2) or some dried fruit like raisins.   Eat something every 2-3 hours during the day, and make sure to have a snack before going to sleep.   Keep some foods convenient, close by, like crackers, graham crackers, fruits, yogurts, ice cream.  If you feel down about yourself and your weight, ask to meet with a counselor, they can help find ways to feel more positive.

## 2016-04-06 NOTE — Progress Notes (Signed)
Medical Nutrition Therapy: Visit start time: [id number]  end time: 1130  Assessment:  Diagnosis: cervical cancer Past medical history: HTN Psychosocial issues/ stress concerns: Patient reports stress due to weight loss and inability to eat much.  Preferred learning method:  . No preference indicated  Current weight: 136.0lbs  Height: 5'7" Medications, supplements: reviewed list in chart  Progress and evaluation: Patient reports weight has recently stabilized, but that she was at 188lbs prior to diagnosis. She feels that she looks bad at this point, does not want to look in a mirror.  She reports symptoms of difficulty swallowing, some taste changes, frequent nausea and poor appetite. She has just finished radiation treatments, but will be starting additional chemo in one week. She feels the radiation has affected her eating more than the chemo.    Physical activity: none   Dietary Intake:  She had been drinking some Ensure, but states she is very tired of it now, does not feel like she can manage drinking any.  She is generally grazing throughout the day on crackers, fruit, peanut butter.  Most meals are eaten out right now.  She ate an Egg McMuffin this morning.  [employer] seems to do well for her, does drink some water too.   Nutrition Care Education: Topics covered: nutrition during cancer treatments Basic nutrition: appropriate meal and snack schedule Cancer nutrition: eating small meals often during the day, every 3 hours; importance of adequate protein; choosing easy-to-chew and swallow foods; controlling odors from foods; making foods convenient to reach and eat; time outdoors and light activity as ways to stimulate appetite;   Nutritional Diagnosis:  Benson-3.2 Unintentional weight loss As related to cancer and cancer treatments.  As evidenced by patient report.  Intervention: Instruction as noted above.   Set some goals with patient input.    Encouraged her to call with any questions or  concerns.  Education Materials given:  Marland Kitchen Eating Hints (NCI) . Goals/ instructions  Learner/ who was taught:  . Patient  . Spouse/ partner  Level of understanding: Marland Kitchen Verbalizes/ demonstrates competency  Demonstrated degree of understanding via:   Teach back Learning barriers: . None  Willingness to learn/ readiness for change: . Eager, change in progress  Monitoring and Evaluation:  Dietary intake, and body weight      follow up: prn

## 2016-04-13 ENCOUNTER — Inpatient Hospital Stay: Payer: Medicare Other | Attending: Internal Medicine | Admitting: Internal Medicine

## 2016-04-13 ENCOUNTER — Inpatient Hospital Stay: Payer: Medicare Other

## 2016-04-13 VITALS — BP 135/83 | HR 86 | Temp 97.8°F | Resp 18 | Wt 138.0 lb

## 2016-04-13 VITALS — BP 156/82 | HR 90 | Resp 20

## 2016-04-13 DIAGNOSIS — R112 Nausea with vomiting, unspecified: Secondary | ICD-10-CM | POA: Insufficient documentation

## 2016-04-13 DIAGNOSIS — C7800 Secondary malignant neoplasm of unspecified lung: Secondary | ICD-10-CM | POA: Diagnosis not present

## 2016-04-13 DIAGNOSIS — Z923 Personal history of irradiation: Secondary | ICD-10-CM | POA: Insufficient documentation

## 2016-04-13 DIAGNOSIS — R200 Anesthesia of skin: Secondary | ICD-10-CM | POA: Diagnosis not present

## 2016-04-13 DIAGNOSIS — I2510 Atherosclerotic heart disease of native coronary artery without angina pectoris: Secondary | ICD-10-CM | POA: Diagnosis not present

## 2016-04-13 DIAGNOSIS — Z7982 Long term (current) use of aspirin: Secondary | ICD-10-CM | POA: Diagnosis not present

## 2016-04-13 DIAGNOSIS — C775 Secondary and unspecified malignant neoplasm of intrapelvic lymph nodes: Secondary | ICD-10-CM | POA: Diagnosis not present

## 2016-04-13 DIAGNOSIS — G5600 Carpal tunnel syndrome, unspecified upper limb: Secondary | ICD-10-CM | POA: Diagnosis not present

## 2016-04-13 DIAGNOSIS — C539 Malignant neoplasm of cervix uteri, unspecified: Secondary | ICD-10-CM | POA: Diagnosis not present

## 2016-04-13 DIAGNOSIS — Z79899 Other long term (current) drug therapy: Secondary | ICD-10-CM | POA: Insufficient documentation

## 2016-04-13 DIAGNOSIS — Z7689 Persons encountering health services in other specified circumstances: Secondary | ICD-10-CM | POA: Diagnosis not present

## 2016-04-13 DIAGNOSIS — F419 Anxiety disorder, unspecified: Secondary | ICD-10-CM

## 2016-04-13 DIAGNOSIS — R202 Paresthesia of skin: Secondary | ICD-10-CM | POA: Diagnosis not present

## 2016-04-13 DIAGNOSIS — C799 Secondary malignant neoplasm of unspecified site: Secondary | ICD-10-CM | POA: Diagnosis not present

## 2016-04-13 DIAGNOSIS — F329 Major depressive disorder, single episode, unspecified: Secondary | ICD-10-CM | POA: Diagnosis not present

## 2016-04-13 DIAGNOSIS — R591 Generalized enlarged lymph nodes: Secondary | ICD-10-CM | POA: Diagnosis not present

## 2016-04-13 DIAGNOSIS — I10 Essential (primary) hypertension: Secondary | ICD-10-CM | POA: Diagnosis not present

## 2016-04-13 DIAGNOSIS — Z5111 Encounter for antineoplastic chemotherapy: Secondary | ICD-10-CM | POA: Insufficient documentation

## 2016-04-13 DIAGNOSIS — M545 Low back pain: Secondary | ICD-10-CM | POA: Diagnosis not present

## 2016-04-13 DIAGNOSIS — Z803 Family history of malignant neoplasm of breast: Secondary | ICD-10-CM | POA: Diagnosis not present

## 2016-04-13 LAB — COMPREHENSIVE METABOLIC PANEL
ALK PHOS: 79 U/L (ref 38–126)
ALT: 8 U/L — AB (ref 14–54)
ANION GAP: 6 (ref 5–15)
AST: 18 U/L (ref 15–41)
Albumin: 3 g/dL — ABNORMAL LOW (ref 3.5–5.0)
BILIRUBIN TOTAL: 0.3 mg/dL (ref 0.3–1.2)
BUN: 8 mg/dL (ref 6–20)
CALCIUM: 8.4 mg/dL — AB (ref 8.9–10.3)
CO2: 22 mmol/L (ref 22–32)
CREATININE: 0.7 mg/dL (ref 0.44–1.00)
Chloride: 105 mmol/L (ref 101–111)
GFR calc non Af Amer: >60 mL/min (ref >60)
Glucose, Bld: 133 mg/dL — ABNORMAL HIGH (ref 65–99)
Potassium: 3.8 mmol/L (ref 3.5–5.1)
SODIUM: 133 mmol/L — AB (ref 135–145)
TOTAL PROTEIN: 6.7 g/dL (ref 6.5–8.1)

## 2016-04-13 LAB — CBC WITH DIFFERENTIAL/PLATELET
Basophils Absolute: 0 10*3/uL (ref 0–0.1)
Basophils Relative: 0 %
EOS PCT: 3 %
Eosinophils Absolute: 0.2 10*3/uL (ref 0–0.7)
HCT: 26.1 % — ABNORMAL LOW (ref 35.0–47.0)
Hemoglobin: 8.9 g/dL — ABNORMAL LOW (ref 12.0–16.0)
LYMPHS ABS: 0.4 10*3/uL — AB (ref 1.0–3.6)
LYMPHS PCT: 7 %
MCH: 30.4 pg (ref 26.0–34.0)
MCHC: 34 g/dL (ref 32.0–36.0)
MCV: 89.6 fL (ref 80.0–100.0)
MONO ABS: 0.8 10*3/uL (ref 0.2–0.9)
MONOS PCT: 11 %
Neutro Abs: 5.3 10*3/uL (ref 1.4–6.5)
Neutrophils Relative %: 79 %
PLATELETS: 346 10*3/uL (ref 150–440)
RBC: 2.91 MIL/uL — ABNORMAL LOW (ref 3.80–5.20)
RDW: 18.1 % — AB (ref 11.5–14.5)
WBC: 6.8 10*3/uL (ref 3.6–11.0)

## 2016-04-13 MED ORDER — SODIUM CHLORIDE 0.9 % IV SOLN
312.0000 mg | Freq: Once | INTRAVENOUS | Status: AC
  Administered 2016-04-13: 312 mg via INTRAVENOUS
  Filled 2016-04-13: qty 52

## 2016-04-13 MED ORDER — DEXAMETHASONE SODIUM PHOSPHATE 100 MG/10ML IJ SOLN
20.0000 mg | Freq: Once | INTRAMUSCULAR | Status: AC
  Administered 2016-04-13: 20 mg via INTRAVENOUS
  Filled 2016-04-13: qty 2

## 2016-04-13 MED ORDER — SODIUM CHLORIDE 0.9% FLUSH
10.0000 mL | Freq: Once | INTRAVENOUS | Status: AC
  Administered 2016-04-13: 10 mL via INTRAVENOUS
  Filled 2016-04-13: qty 10

## 2016-04-13 MED ORDER — TOPOTECAN HCL CHEMO INJECTION 4 MG
0.7500 mg/m2 | Freq: Once | INTRAVENOUS | Status: AC
  Administered 2016-04-13: 1.3 mg via INTRAVENOUS
  Filled 2016-04-13: qty 1.3

## 2016-04-13 MED ORDER — SODIUM CHLORIDE 0.9 % IV SOLN
Freq: Once | INTRAVENOUS | Status: AC
  Administered 2016-04-13: 10:00:00 via INTRAVENOUS
  Filled 2016-04-13: qty 1000

## 2016-04-13 MED ORDER — PROCHLORPERAZINE MALEATE 10 MG PO TABS
10.0000 mg | ORAL_TABLET | Freq: Once | ORAL | Status: AC
Start: 2016-04-13 — End: 2016-04-13
  Administered 2016-04-13: 10 mg via ORAL
  Filled 2016-04-13: qty 1

## 2016-04-13 MED ORDER — SODIUM CHLORIDE 0.9 % IV SOLN
15.0000 mg/kg | Freq: Once | INTRAVENOUS | Status: AC
  Administered 2016-04-13: 950 mg via INTRAVENOUS
  Filled 2016-04-13: qty 38

## 2016-04-13 MED ORDER — DIPHENHYDRAMINE HCL 50 MG/ML IJ SOLN
50.0000 mg | Freq: Once | INTRAMUSCULAR | Status: AC
Start: 2016-04-13 — End: 2016-04-13
  Administered 2016-04-13: 50 mg via INTRAVENOUS
  Filled 2016-04-13: qty 1

## 2016-04-13 MED ORDER — FAMOTIDINE IN NACL 20-0.9 MG/50ML-% IV SOLN
20.0000 mg | Freq: Two times a day (BID) | INTRAVENOUS | Status: DC
  Administered 2016-04-13: 20 mg via INTRAVENOUS
  Filled 2016-04-13: qty 50

## 2016-04-13 MED ORDER — HEPARIN SOD (PORK) LOCK FLUSH 100 UNIT/ML IV SOLN
500.0000 [IU] | Freq: Once | INTRAVENOUS | Status: AC
  Administered 2016-04-13: 500 [IU] via INTRAVENOUS
  Filled 2016-04-13: qty 5

## 2016-04-13 MED ORDER — HEPARIN SOD (PORK) LOCK FLUSH 100 UNIT/ML IV SOLN
500.0000 [IU] | Freq: Once | INTRAVENOUS | Status: AC
  Administered 2016-04-13: 500 [IU] via INTRAVENOUS

## 2016-04-13 NOTE — Progress Notes (Signed)
Appetite has improved.  Feeling much better.

## 2016-04-13 NOTE — Progress Notes (Signed)
[place] OFFICE PROGRESS NOTE  Patient Care Team: Petra Kuba, MD as PCP - General (Family Medicine) Clent Jacks, RN as Registered Nurse  Primary cervical cancer with metastasis to other site [facility])   Staging form: Cervix Uteri, AJCC 7th Edition     Clinical: No stage assigned - Unsigned    Oncology History   # FEB 2017- STAGE IV ENDOCERVICAL vs endometrial ADENO CA [s/p Inguinal LN Bx; s/p endocervical Bx- pos; p16 pos? endocervical] Lymphadenopathy-RP/Ingiunal LN; CT-PET bil pul nodules [sub-cm]/ MRI- pelvis; March 10th- Carbo-taxol x3 cycles- MAY CT- PROGRESSION  # MAY 2017- TAXOL- TOPOTECAN-AVASTIN; 03/16/2016-CT-Partial Response  # L-1spinous process lytic lesion [on PET]- not on X-geva.    # MOLECULAR TESTING: MSI- STABLE     Primary cervical cancer with metastasis to other site [facility])     INTERVAL HISTORY:  A very pleasant 70 year old female patient with above history of endocervical cancer currently on second line therapy with Taxol topotecan Avastin is here for follow-up. Patient received cycle #4 approximately 3 weeks ago/Is here For follow-up.  Patient's back pain has resolved. She finished radiation. She is gaining weight. Gained about 4 pounds. She has met with the nutritionist. Mild tingling and numbness not any worse.   Patient otherwise denies any headaches. Denies any nosebleeds or swelling of the legs.  She has noted improvement of her inguinal adenopathy.  REVIEW OF SYSTEMS:  A complete 10 point review of system is done which is negative except mentioned above/history of present illness.   PAST MEDICAL HISTORY :  Past Medical History:  Diagnosis Date  . Anxiety   . Carpal tunnel syndrome   . Chemotherapy-induced nausea and vomiting   . Decrease in appetite   . Decreased appetite   . Depression   . Endometrial cancer ([place]) 11/06/2015  . Hypertension   . Lymphadenopathy  10/25/15  . Nausea & vomiting   . Port-a-cath in place   . Weight loss    30 lb weight loss since October 2016  . Weight loss     PAST SURGICAL HISTORY :   Past Surgical History:  Procedure Laterality Date  . CARPAL TUNNEL RELEASE Left   . FLEXIBLE SIGMOIDOSCOPY  05/2015   performed by Dr. Petra Kuba,  . PERIPHERAL VASCULAR CATHETERIZATION N/A 02/25/2016   Procedure: Glori Luis Cath Insertion;  Surgeon: Katha Cabal, MD;  Location: [place] CV LAB;  Service: Cardiovascular;  Laterality: N/A;  . SKIN CANCER EXCISION      FAMILY HISTORY :   Family History  Problem Relation Age of Onset  . Breast cancer Sister   . Hypertension Sister   . Hypertension Brother   . Ovarian cancer Paternal Aunt   . Diabetes Maternal Grandmother   . Breast cancer Daughter   . Breast cancer Cousin     maternal    SOCIAL HISTORY:   Social History  Substance Use Topics  . Smoking status: Never Smoker  . Smokeless tobacco: Never Used  . Alcohol use No    ALLERGIES:  is allergic to ibuprofen and tape.  MEDICATIONS:  Current Outpatient Prescriptions  Medication Sig Dispense Refill  . amLODipine (NORVASC) 10 MG tablet Take 1 tablet by mouth daily. Reported on 11/25/2015    . aspirin EC 81 MG tablet Take 81 mg by mouth daily. Reported on 01/31/2016    . dronabinol (MARINOL) 5 MG capsule Take 1 capsule (5 mg total) by mouth 2 (  two) times daily before a meal. 60 capsule 1  . hydrochlorothiazide (HYDRODIURIL) 25 MG tablet Take 1 tablet by mouth daily. Reported on 01/31/2016    . HYDROcodone-acetaminophen (NORCO/VICODIN) 5-325 MG tablet Take 1 tablet by mouth every 6 (six) hours as needed for moderate pain. 40 tablet 0  . lidocaine-prilocaine (EMLA) cream Apply 1 application topically as needed. To port site 30 g 0  . losartan (COZAAR) 100 MG tablet Take 1 tablet by mouth daily. Reported on 11/25/2015    . ondansetron (ZOFRAN) 8 MG tablet Start 3 days after chemo 40 tablet 0  . predniSONE  (DELTASONE) 20 MG tablet     . prochlorperazine (COMPAZINE) 10 MG tablet Take 1 tablet (10 mg total) by mouth every 6 (six) hours as needed for nausea or vomiting. 40 tablet 0  . promethazine (PHENERGAN) 25 MG tablet Take 1 tablet (25 mg total) by mouth every 6 (six) hours as needed for nausea or vomiting. 20 tablet 0   No current facility-administered medications for this visit.    Facility-Administered Medications Ordered in Other Visits  Medication Dose Route Frequency Provider Last Rate Last Dose  . famotidine (PEPCID) IVPB 20 mg premix  20 mg Intravenous Q12H Cammie Sickle, MD   20 mg at 04/13/16 1003  . heparin lock flush 100 unit/mL  500 Units Intravenous Once Cammie Sickle, MD      . PACLitaxel (TAXOL) 312 mg in sodium chloride 0.9 % 500 mL chemo infusion (> 80m/m2)  312 mg Intravenous Once GCammie Sickle MD 184 mL/hr at 04/13/16 1203 312 mg at 04/13/16 1203  . topotecan (HYCAMTIN) 1.3 mg in sodium chloride 0.9 % 100 mL chemo infusion  0.75 mg/m2 (Treatment Plan Recorded) Intravenous Once GCammie Sickle MD        PHYSICAL EXAMINATION: ECOG PERFORMANCE STATUS: 1 - Symptomatic but completely ambulatory  BP 135/83 (BP Location: Left Arm, Patient Position: Sitting)   Pulse 86   Temp 97.8 F (36.6 C) (Tympanic)   Resp 18   Wt 138 lb (62.6 kg)   BMI 21.61 kg/m   Filed Weights   04/13/16 0856  Weight: 138 lb (62.6 kg)    GENERAL:Thin built moderately nourished female patient  Alert, no distress and comfortable.   Accompanied by husband.S: no pallor or icterus OROPHARYNX: no thrush or ulceration; good dentition  NECK: supple, no masses felt LYMPH:  no palpable lymphadenopathy in the cervical, axillary region. Left inguinal region 1.5-2 cm lymph node felt.  LUNGS: clear to auscultation and  No wheeze or crackles HEART/CVS: regular rate & rhythm and no murmurs; No lower extremity edema ABDOMEN:abdomen soft, non-tender and normal bowel  sounds Musculoskeletal:no cyanosis of digits and no clubbing  PSYCH: alert & oriented x 3 with fluent speech NEURO: no focal motor/sensory deficits SKIN:  no rashes or significant lesions  LABORATORY DATA:  I have reviewed the data as listed    Component Value Date/Time   NA 133 (L) 04/13/2016 0825   K 3.8 04/13/2016 0825   CL 105 04/13/2016 0825   CO2 22 04/13/2016 0825   GLUCOSE 133 (H) 04/13/2016 0825   BUN 8 04/13/2016 0825   CREATININE 0.70 04/13/2016 0825   CALCIUM 8.4 (L) 04/13/2016 0825   PROT 6.7 04/13/2016 0825   ALBUMIN 3.0 (L) 04/13/2016 0825   AST 18 04/13/2016 0825   ALT 8 (L) 04/13/2016 0825   ALKPHOS 79 04/13/2016 0825   BILITOT 0.3 04/13/2016 0825   GFRNONAA >60 04/13/2016 0825  GFRAA >60 04/13/2016 0825    No results found for: SPEP, UPEP  Lab Results  Component Value Date   WBC 6.8 04/13/2016   NEUTROABS 5.3 04/13/2016   HGB 8.9 (L) 04/13/2016   HCT 26.1 (L) 04/13/2016   MCV 89.6 04/13/2016   PLT 346 04/13/2016      Chemistry      Component Value Date/Time   NA 133 (L) 04/13/2016 0825   K 3.8 04/13/2016 0825   CL 105 04/13/2016 0825   CO2 22 04/13/2016 0825   BUN 8 04/13/2016 0825   CREATININE 0.70 04/13/2016 0825      Component Value Date/Time   CALCIUM 8.4 (L) 04/13/2016 0825   ALKPHOS 79 04/13/2016 0825   AST 18 04/13/2016 0825   ALT 8 (L) 04/13/2016 0825   BILITOT 0.3 04/13/2016 0825     IMPRESSION: 1. Partial interval treatment response. Pulmonary metastases, retroperitoneal and bilateral pelvic nodal metastases, endocervical mass, bilateral adnexal masses and appendiceal tip serosal implants are all mildly decreased in size. No new or progressive metastatic disease. 2. Additional findings include coronary atherosclerosis and aortic atherosclerosis.   Electronically Signed  By: Ilona Sorrel M.D.  On: 03/16/2016 15:40  RADIOGRAPHIC STUDIES: I have personally reviewed the radiological images as listed and agreed with  the findings in the report. No results found.   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 Discussed with Gyn-Onc- Given wide spread mets/Not a surgical candidate.   # Continue Taxol-Topotecan- Avastin # 5. Labs reviewed. Will plan to get CT after 6 cycles. Discussed re: maintenance avastin vs. Chemo break.   # Lumbar pain -on RT; improved. On X-geva q 6 W  #.Elevated BPs-sec to avastin-  better controlled.   # weight loss- improving.   # follow up with me in 3 weeks/cbc-cmp/UA.    No orders of the defined types were placed in this encounter.  All questions were answered. The patient knows to call the clinic with any problems, questions or concerns    Cammie Sickle, MD 04/13/2016 1:43 PM        Cammie Sickle, MD 04/13/2016 1:43 PM

## 2016-04-13 NOTE — Assessment & Plan Note (Addendum)
Stage IV metastatic endocervical cancer currently on therapy with topotecan Taxol Avastin status post cycle #3; July 10th CT- PR. Discussed with Gyn-Onc- Given wide spread mets/Not a surgical candidate.   # Continue Taxol-Topotecan- Avastin # 5. Labs reviewed. Will plan to get CT after 6 cycles. Discussed re: maintenance avastin vs. Chemo break.   # Lumbar pain -on RT; improved. On X-geva q 6 W  #.Elevated BPs-sec to avastin-  better controlled.   # weight loss- improving.   # follow up with me in 3 weeks/cbc-cmp/UA.

## 2016-04-14 ENCOUNTER — Inpatient Hospital Stay: Payer: Medicare Other

## 2016-04-14 VITALS — BP 158/76 | HR 74 | Temp 96.5°F | Resp 18

## 2016-04-14 DIAGNOSIS — Z5111 Encounter for antineoplastic chemotherapy: Secondary | ICD-10-CM | POA: Diagnosis not present

## 2016-04-14 DIAGNOSIS — C539 Malignant neoplasm of cervix uteri, unspecified: Secondary | ICD-10-CM

## 2016-04-14 MED ORDER — PROCHLORPERAZINE MALEATE 10 MG PO TABS
10.0000 mg | ORAL_TABLET | Freq: Once | ORAL | Status: AC
Start: 2016-04-14 — End: 2016-04-14
  Administered 2016-04-14: 10 mg via ORAL
  Filled 2016-04-14: qty 1

## 2016-04-14 MED ORDER — SODIUM CHLORIDE 0.9 % IV SOLN
Freq: Once | INTRAVENOUS | Status: AC
  Administered 2016-04-14: 15:00:00 via INTRAVENOUS
  Filled 2016-04-14: qty 1000

## 2016-04-14 MED ORDER — TOPOTECAN HCL CHEMO INJECTION 4 MG
0.7500 mg/m2 | Freq: Once | INTRAVENOUS | Status: AC
  Administered 2016-04-14: 1.3 mg via INTRAVENOUS
  Filled 2016-04-14: qty 1.3

## 2016-04-14 MED ORDER — HEPARIN SOD (PORK) LOCK FLUSH 100 UNIT/ML IV SOLN
500.0000 [IU] | Freq: Once | INTRAVENOUS | Status: AC | PRN
  Administered 2016-04-14: 500 [IU]

## 2016-04-15 ENCOUNTER — Inpatient Hospital Stay: Payer: Medicare Other

## 2016-04-15 VITALS — BP 150/74 | HR 79 | Temp 96.8°F | Resp 20

## 2016-04-15 DIAGNOSIS — C539 Malignant neoplasm of cervix uteri, unspecified: Secondary | ICD-10-CM

## 2016-04-15 DIAGNOSIS — Z5111 Encounter for antineoplastic chemotherapy: Secondary | ICD-10-CM | POA: Diagnosis not present

## 2016-04-15 LAB — PROTEIN ELECTRO, RANDOM URINE
ALPHA-1-GLOBULIN, U: 2.5 %
ALPHA-2-GLOBULIN, U: 15.7 %
Albumin ELP, Urine: 27.4 %
BETA GLOBULIN, U: 24.2 %
GAMMA GLOBULIN, U: 30.3 %
Total Protein, Urine: 19.4 mg/dL

## 2016-04-15 MED ORDER — PROCHLORPERAZINE MALEATE 10 MG PO TABS
10.0000 mg | ORAL_TABLET | Freq: Once | ORAL | Status: AC
  Administered 2016-04-15: 10 mg via ORAL
  Filled 2016-04-15: qty 1

## 2016-04-15 MED ORDER — TOPOTECAN HCL CHEMO INJECTION 4 MG
0.7500 mg/m2 | Freq: Once | INTRAVENOUS | Status: AC
  Administered 2016-04-15: 1.3 mg via INTRAVENOUS
  Filled 2016-04-15: qty 1.3

## 2016-04-15 MED ORDER — PEGFILGRASTIM 6 MG/0.6ML ~~LOC~~ PSKT
6.0000 mg | PREFILLED_SYRINGE | Freq: Once | SUBCUTANEOUS | Status: AC
  Administered 2016-04-15: 6 mg via SUBCUTANEOUS
  Filled 2016-04-15: qty 0.6

## 2016-04-15 MED ORDER — HEPARIN SOD (PORK) LOCK FLUSH 100 UNIT/ML IV SOLN
500.0000 [IU] | Freq: Once | INTRAVENOUS | Status: AC | PRN
  Administered 2016-04-15: 500 [IU]
  Filled 2016-04-15: qty 5

## 2016-04-15 MED ORDER — SODIUM CHLORIDE 0.9% FLUSH
10.0000 mL | INTRAVENOUS | Status: DC | PRN
  Administered 2016-04-15: 10 mL
  Filled 2016-04-15: qty 10

## 2016-04-15 MED ORDER — SODIUM CHLORIDE 0.9 % IV SOLN
Freq: Once | INTRAVENOUS | Status: AC
Start: 2016-04-15 — End: 2016-04-15
  Administered 2016-04-15: 14:00:00 via INTRAVENOUS
  Filled 2016-04-15: qty 1000

## 2016-04-30 ENCOUNTER — Ambulatory Visit
Admission: RE | Admit: 2016-04-30 | Discharge: 2016-04-30 | Disposition: A | Discharge status: Home / Self Care | Payer: Medicare Other | Source: Ambulatory Visit | Attending: Radiation Oncology | Admitting: Radiation Oncology

## 2016-04-30 ENCOUNTER — Encounter: Payer: Self-pay | Admitting: Radiation Oncology

## 2016-04-30 VITALS — BP 150/78 | HR 89 | Temp 98.0°F | Wt 134.5 lb

## 2016-04-30 DIAGNOSIS — C539 Malignant neoplasm of cervix uteri, unspecified: Secondary | ICD-10-CM

## 2016-04-30 NOTE — Progress Notes (Signed)
Radiation Oncology Follow up Note  Name: Jennifer Berry   Date:   04/30/2016 MRN:  [id number] DOB: 08/14/1946    This 70 y.o. female presents to the clinic today for one-month follow-up for palliative radiation therapy to her L-spine.  REFERRING PROVIDER: Petra Kuba, MD  HPI: Patient is a 70 year old female with known stage IV cervical cancer with bone involvement. She's 1 month out from palliative radiation therapy to her lumbar spine. She states she's had a complete response as far as pain is concerned. She's having no trouble ambulating. 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 She has no other sites of pain at this time she specifically denies diarrhea dysuria or any other GI/GU complaints..  COMPLICATIONS OF TREATMENT: none  FOLLOW UP COMPLIANCE: keeps appointments   PHYSICAL EXAM:  BP (!) 150/78   Pulse 89   Temp 98 F (36.7 C)   Wt 134 lb 7.7 oz (61 kg)   BMI 21.06 kg/m  Well-developed well-nourished patient in NAD. HEENT reveals PERLA, EOMI, discs not visualized.  Oral cavity is clear. No oral mucosal lesions are identified. Neck is clear without evidence of cervical or supraclavicular adenopathy. Lungs are clear to A&P. Cardiac examination is essentially unremarkable with regular rate and rhythm without murmur rub or thrill. Abdomen is benign with no organomegaly or masses noted. Motor sensory and DTR levels are equal and symmetric in the upper and lower extremities. Cranial nerves II through XII are grossly intact. Proprioception is intact. No peripheral adenopathy or edema is identified. No motor or sensory levels are noted. Crude visual fields are within normal range.  RADIOLOGY RESULTS: No current films for review  PLAN: At this time I will turn over follow-up care to medical oncology. I would be happy to reevaluate the patient at any time for further  palliative treatment. Patient knows to call with any concerns.  I would like to take this  opportunity to thank you for allowing me to participate in the care of your patient.Armstead Peaks., MD

## 2016-05-04 ENCOUNTER — Inpatient Hospital Stay: Payer: Medicare Other

## 2016-05-04 ENCOUNTER — Inpatient Hospital Stay (HOSPITAL_BASED_OUTPATIENT_CLINIC_OR_DEPARTMENT_OTHER): Payer: Medicare Other | Admitting: Internal Medicine

## 2016-05-04 ENCOUNTER — Telehealth: Payer: Self-pay | Admitting: Pharmacist

## 2016-05-04 VITALS — BP 152/98 | HR 90

## 2016-05-04 DIAGNOSIS — F419 Anxiety disorder, unspecified: Secondary | ICD-10-CM

## 2016-05-04 DIAGNOSIS — Z923 Personal history of irradiation: Secondary | ICD-10-CM

## 2016-05-04 DIAGNOSIS — C539 Malignant neoplasm of cervix uteri, unspecified: Secondary | ICD-10-CM | POA: Diagnosis not present

## 2016-05-04 DIAGNOSIS — I2510 Atherosclerotic heart disease of native coronary artery without angina pectoris: Secondary | ICD-10-CM

## 2016-05-04 DIAGNOSIS — C799 Secondary malignant neoplasm of unspecified site: Secondary | ICD-10-CM | POA: Diagnosis not present

## 2016-05-04 DIAGNOSIS — R202 Paresthesia of skin: Secondary | ICD-10-CM

## 2016-05-04 DIAGNOSIS — R591 Generalized enlarged lymph nodes: Secondary | ICD-10-CM

## 2016-05-04 DIAGNOSIS — R112 Nausea with vomiting, unspecified: Secondary | ICD-10-CM

## 2016-05-04 DIAGNOSIS — Z5111 Encounter for antineoplastic chemotherapy: Secondary | ICD-10-CM | POA: Diagnosis not present

## 2016-05-04 DIAGNOSIS — F329 Major depressive disorder, single episode, unspecified: Secondary | ICD-10-CM

## 2016-05-04 DIAGNOSIS — C7800 Secondary malignant neoplasm of unspecified lung: Secondary | ICD-10-CM

## 2016-05-04 DIAGNOSIS — C775 Secondary and unspecified malignant neoplasm of intrapelvic lymph nodes: Secondary | ICD-10-CM

## 2016-05-04 DIAGNOSIS — R200 Anesthesia of skin: Secondary | ICD-10-CM

## 2016-05-04 DIAGNOSIS — M545 Low back pain: Secondary | ICD-10-CM

## 2016-05-04 DIAGNOSIS — I10 Essential (primary) hypertension: Secondary | ICD-10-CM

## 2016-05-04 DIAGNOSIS — Z803 Family history of malignant neoplasm of breast: Secondary | ICD-10-CM

## 2016-05-04 DIAGNOSIS — G5600 Carpal tunnel syndrome, unspecified upper limb: Secondary | ICD-10-CM

## 2016-05-04 DIAGNOSIS — Z7982 Long term (current) use of aspirin: Secondary | ICD-10-CM

## 2016-05-04 DIAGNOSIS — Z79899 Other long term (current) drug therapy: Secondary | ICD-10-CM

## 2016-05-04 DIAGNOSIS — Z7689 Persons encountering health services in other specified circumstances: Secondary | ICD-10-CM

## 2016-05-04 LAB — CBC WITH DIFFERENTIAL/PLATELET
BASOS ABS: 0 10*3/uL (ref 0–0.1)
Basophils Relative: 0 %
EOS PCT: 2 %
Eosinophils Absolute: 0.1 10*3/uL (ref 0–0.7)
HCT: 28 % — ABNORMAL LOW (ref 35.0–47.0)
Hemoglobin: 9.4 g/dL — ABNORMAL LOW (ref 12.0–16.0)
LYMPHS PCT: 10 %
Lymphs Abs: 0.5 10*3/uL — ABNORMAL LOW (ref 1.0–3.6)
MCH: 30 pg (ref 26.0–34.0)
MCHC: 33.6 g/dL (ref 32.0–36.0)
MCV: 89.4 fL (ref 80.0–100.0)
MONO ABS: 0.6 10*3/uL (ref 0.2–0.9)
Monocytes Relative: 11 %
Neutro Abs: 4.4 10*3/uL (ref 1.4–6.5)
Neutrophils Relative %: 77 %
PLATELETS: 358 10*3/uL (ref 150–440)
RBC: 3.13 MIL/uL — ABNORMAL LOW (ref 3.80–5.20)
RDW: 18.2 % — AB (ref 11.5–14.5)
WBC: 5.7 10*3/uL (ref 3.6–11.0)

## 2016-05-04 LAB — URINALYSIS COMPLETE WITH MICROSCOPIC (ARMC ONLY)
BILIRUBIN URINE: NEGATIVE
GLUCOSE, UA: NEGATIVE mg/dL
HGB URINE DIPSTICK: NEGATIVE
Ketones, ur: NEGATIVE mg/dL
NITRITE: NEGATIVE
PH: 5 (ref 5.0–8.0)
Protein, ur: NEGATIVE mg/dL
SPECIFIC GRAVITY, URINE: 1.02 (ref 1.005–1.030)

## 2016-05-04 LAB — COMPREHENSIVE METABOLIC PANEL
ALBUMIN: 3.2 g/dL — AB (ref 3.5–5.0)
ALK PHOS: 81 U/L (ref 38–126)
ALT: 8 U/L — ABNORMAL LOW (ref 14–54)
AST: 16 U/L (ref 15–41)
Anion gap: 7 (ref 5–15)
BILIRUBIN TOTAL: 0.3 mg/dL (ref 0.3–1.2)
BUN: 9 mg/dL (ref 6–20)
CALCIUM: 8.9 mg/dL (ref 8.9–10.3)
CO2: 25 mmol/L (ref 22–32)
Chloride: 104 mmol/L (ref 101–111)
Creatinine, Ser: 0.75 mg/dL (ref 0.44–1.00)
GFR calc Af Amer: >60 mL/min (ref >60)
GFR calc non Af Amer: >60 mL/min (ref >60)
GLUCOSE: 108 mg/dL — AB (ref 65–99)
POTASSIUM: 4 mmol/L (ref 3.5–5.1)
Sodium: 136 mmol/L (ref 135–145)
TOTAL PROTEIN: 7.2 g/dL (ref 6.5–8.1)

## 2016-05-04 MED ORDER — SODIUM CHLORIDE 0.9 % IV SOLN
312.0000 mg | Freq: Once | INTRAVENOUS | Status: AC
  Administered 2016-05-04: 312 mg via INTRAVENOUS
  Filled 2016-05-04: qty 52

## 2016-05-04 MED ORDER — TOPOTECAN HCL CHEMO INJECTION 4 MG
0.7500 mg/m2 | Freq: Once | INTRAVENOUS | Status: AC
  Administered 2016-05-04: 1.3 mg via INTRAVENOUS
  Filled 2016-05-04: qty 1.3

## 2016-05-04 MED ORDER — FAMOTIDINE IN NACL 20-0.9 MG/50ML-% IV SOLN
20.0000 mg | Freq: Two times a day (BID) | INTRAVENOUS | Status: DC
  Administered 2016-05-04: 20 mg via INTRAVENOUS
  Filled 2016-05-04: qty 50

## 2016-05-04 MED ORDER — DIPHENHYDRAMINE HCL 50 MG/ML IJ SOLN
50.0000 mg | Freq: Once | INTRAMUSCULAR | Status: AC
  Administered 2016-05-04: 50 mg via INTRAVENOUS
  Filled 2016-05-04: qty 1

## 2016-05-04 MED ORDER — SODIUM CHLORIDE 0.9 % IV SOLN
20.0000 mg | Freq: Once | INTRAVENOUS | Status: AC
  Administered 2016-05-04: 20 mg via INTRAVENOUS
  Filled 2016-05-04: qty 2

## 2016-05-04 MED ORDER — HEPARIN SOD (PORK) LOCK FLUSH 100 UNIT/ML IV SOLN
500.0000 [IU] | Freq: Once | INTRAVENOUS | Status: AC | PRN
  Administered 2016-05-04: 500 [IU]
  Filled 2016-05-04: qty 5

## 2016-05-04 MED ORDER — PROCHLORPERAZINE MALEATE 10 MG PO TABS
10.0000 mg | ORAL_TABLET | Freq: Once | ORAL | Status: AC
  Administered 2016-05-04: 10 mg via ORAL
  Filled 2016-05-04: qty 1

## 2016-05-04 MED ORDER — DEXTROSE 5 % IV SOLN
312.0000 mg | Freq: Once | INTRAVENOUS | Status: DC
  Filled 2016-05-04: qty 52

## 2016-05-04 MED ORDER — SODIUM CHLORIDE 0.9 % IV SOLN
15.0000 mg/kg | Freq: Once | INTRAVENOUS | Status: AC
  Administered 2016-05-04: 950 mg via INTRAVENOUS
  Filled 2016-05-04: qty 32

## 2016-05-04 MED ORDER — SODIUM CHLORIDE 0.9 % IV SOLN
Freq: Once | INTRAVENOUS | Status: AC
  Administered 2016-05-04: 10:00:00 via INTRAVENOUS
  Filled 2016-05-04: qty 1000

## 2016-05-04 NOTE — Assessment & Plan Note (Addendum)
Stage IV metastatic endocervical cancer currently on therapy with topotecan Taxol Avastin status post cycle #3; July 10th CT- PR.   # Continue Taxol-Topotecan- Avastin # 6. Labs reviewed. Will plan to get CT after this cycle. Discussed re: maintenance avastin after few weeks of  Chemo break.   # Lumbar pain -on RT; improved. On X-geva q 6 W  #.Elevated BPs-sec to avastin- recommend checking- blood pressure at home; and call us- if still high add metoprolol.   # PN G-1 sec to Taxol.   # follow up with me in 3 weeks/cbc-cmp/UA; no plans for treatment at that visit.

## 2016-05-04 NOTE — Telephone Encounter (Signed)
Spoke with Dr. Rogue Bussing regarding patient increased blood pressure. Per MD, continue treatment as ordered for today. Patient's blood pressure fluctuates and she states she did not take her BP medication this am.

## 2016-05-04 NOTE — Progress Notes (Signed)
[place] OFFICE PROGRESS NOTE  Patient Care Team: Petra Kuba, MD as PCP - General (Family Medicine) Clent Jacks, RN as Registered Nurse  Primary cervical cancer with metastasis to other site [facility])   Staging form: Cervix Uteri, AJCC 7th Edition     Clinical: No stage assigned - Unsigned    Oncology History   # FEB 2017- STAGE IV ENDOCERVICAL vs endometrial ADENO CA [s/p Inguinal LN Bx; s/p endocervical Bx- pos; p16 pos? endocervical] Lymphadenopathy-RP/Ingiunal LN; CT-PET bil pul nodules [sub-cm]/ MRI- pelvis; March 10th- Carbo-taxol x3 cycles- MAY CT- PROGRESSION  # MAY 2017- TAXOL- TOPOTECAN- AVASTIN; 03/16/2016-CT-Partial Response  # L-1spinous process lytic lesion [on PET]- not on X-geva. S/p RT [JULy 2017]   # MOLECULAR TESTING: MSI- STABLE     Primary cervical cancer with metastasis to other site [facility])     INTERVAL HISTORY:  A very pleasant 70 year old female patient with above history of endocervical cancer currently on second line therapy with Taxol topotecan Avastin is here for follow-up. Patient received cycle #5 approximately 3 weeks ago/Is here For follow-up.  Complains of tingling and numbness of the extremities; however no falls. Appetite fair. She feels tired few Days after chemotherapy.   Patient otherwise denies any headaches. Denies any nosebleeds or swelling of the legs.  She has noted improvement of her inguinal adenopathy.  REVIEW OF SYSTEMS:  A complete 10 point review of system is done which is negative except mentioned above/history of present illness.   PAST MEDICAL HISTORY :  Past Medical History:  Diagnosis Date  . Anxiety   . Carpal tunnel syndrome   . Chemotherapy-induced nausea and vomiting   . Decrease in appetite   . Decreased appetite   . Depression   . Endometrial cancer ([place]) 11/06/2015  . Hypertension   . Lymphadenopathy 10/25/15  . Nausea & vomiting   .  Port-a-cath in place   . Weight loss    30 lb weight loss since October 2016  . Weight loss     PAST SURGICAL HISTORY :   Past Surgical History:  Procedure Laterality Date  . CARPAL TUNNEL RELEASE Left   . FLEXIBLE SIGMOIDOSCOPY  05/2015   performed by Dr. Petra Kuba,  . PERIPHERAL VASCULAR CATHETERIZATION N/A 02/25/2016   Procedure: Glori Luis Cath Insertion;  Surgeon: Katha Cabal, MD;  Location: [place] CV LAB;  Service: Cardiovascular;  Laterality: N/A;  . SKIN CANCER EXCISION      FAMILY HISTORY :   Family History  Problem Relation Age of Onset  . Breast cancer Sister   . Hypertension Sister   . Hypertension Brother   . Ovarian cancer Paternal Aunt   . Diabetes Maternal Grandmother   . Breast cancer Daughter   . Breast cancer Cousin     maternal    SOCIAL HISTORY:   Social History  Substance Use Topics  . Smoking status: Never Smoker  . Smokeless tobacco: Never Used  . Alcohol use No    ALLERGIES:  is allergic to ibuprofen and tape.  MEDICATIONS:  Current Outpatient Prescriptions  Medication Sig Dispense Refill  . amLODipine (NORVASC) 10 MG tablet Take 1 tablet by mouth daily. Reported on 11/25/2015    . aspirin EC 81 MG tablet Take 81 mg by mouth daily. Reported on 01/31/2016    . dronabinol (MARINOL) 5 MG capsule Take 1 capsule (5 mg total) by mouth 2 (two) times daily before a  meal. 60 capsule 1  . hydrochlorothiazide (HYDRODIURIL) 25 MG tablet Take 1 tablet by mouth daily. Reported on 01/31/2016    . HYDROcodone-acetaminophen (NORCO/VICODIN) 5-325 MG tablet Take 1 tablet by mouth every 6 (six) hours as needed for moderate pain. 40 tablet 0  . lidocaine-prilocaine (EMLA) cream Apply 1 application topically as needed. To port site 30 g 0  . losartan (COZAAR) 100 MG tablet Take 1 tablet by mouth daily. Reported on 11/25/2015    . ondansetron (ZOFRAN) 8 MG tablet Start 3 days after chemo 40 tablet 0  . predniSONE (DELTASONE) 20 MG tablet     .  prochlorperazine (COMPAZINE) 10 MG tablet Take 1 tablet (10 mg total) by mouth every 6 (six) hours as needed for nausea or vomiting. 40 tablet 0  . promethazine (PHENERGAN) 25 MG tablet Take 1 tablet (25 mg total) by mouth every 6 (six) hours as needed for nausea or vomiting. 20 tablet 0   No current facility-administered medications for this visit.     PHYSICAL EXAMINATION: ECOG PERFORMANCE STATUS: 1 - Symptomatic but completely ambulatory  BP (!) 162/94 (BP Location: Right Arm, Patient Position: Sitting)   Pulse 82   Temp 97.2 F (36.2 C) (Tympanic)   Resp 18   Wt 136 lb (61.7 kg)   BMI 21.30 kg/m   Filed Weights   05/04/16 0854  Weight: 136 lb (61.7 kg)    GENERAL:Thin built moderately nourished female patient  Alert, no distress and comfortable.   Accompanied by husband.S: no pallor or icterus OROPHARYNX: no thrush or ulceration; good dentition  NECK: supple, no masses felt LYMPH:  no palpable lymphadenopathy in the cervical, axillary region. Left inguinal region ~1cm felt improved.  LUNGS: clear to auscultation and  No wheeze or crackles HEART/CVS: regular rate & rhythm and no murmurs; No lower extremity edema ABDOMEN:abdomen soft, non-tender and normal bowel sounds Musculoskeletal:no cyanosis of digits and no clubbing  PSYCH: alert & oriented x 3 with fluent speech NEURO: no focal motor/sensory deficits SKIN:  no rashes or significant lesions  LABORATORY DATA:  I have reviewed the data as listed    Component Value Date/Time   NA 136 05/04/2016 0845   K 4.0 05/04/2016 0845   CL 104 05/04/2016 0845   CO2 25 05/04/2016 0845   GLUCOSE 108 (H) 05/04/2016 0845   BUN 9 05/04/2016 0845   CREATININE 0.75 05/04/2016 0845   CALCIUM 8.9 05/04/2016 0845   PROT 7.2 05/04/2016 0845   ALBUMIN 3.2 (L) 05/04/2016 0845   AST 16 05/04/2016 0845   ALT 8 (L) 05/04/2016 0845   ALKPHOS 81 05/04/2016 0845   BILITOT 0.3 05/04/2016 0845   GFRNONAA >60 05/04/2016 0845   GFRAA >60  05/04/2016 0845    No results found for: SPEP, UPEP  Lab Results  Component Value Date   WBC 5.7 05/04/2016   NEUTROABS 4.4 05/04/2016   HGB 9.4 (L) 05/04/2016   HCT 28.0 (L) 05/04/2016   MCV 89.4 05/04/2016   PLT 358 05/04/2016      Chemistry      Component Value Date/Time   NA 136 05/04/2016 0845   K 4.0 05/04/2016 0845   CL 104 05/04/2016 0845   CO2 25 05/04/2016 0845   BUN 9 05/04/2016 0845   CREATININE 0.75 05/04/2016 0845      Component Value Date/Time   CALCIUM 8.9 05/04/2016 0845   ALKPHOS 81 05/04/2016 0845   AST 16 05/04/2016 0845   ALT 8 (L) 05/04/2016 0845  BILITOT 0.3 05/04/2016 0845     IMPRESSION: 1. Partial interval treatment response. Pulmonary metastases, retroperitoneal and bilateral pelvic nodal metastases, endocervical mass, bilateral adnexal masses and appendiceal tip serosal implants are all mildly decreased in size. No new or progressive metastatic disease. 2. Additional findings include coronary atherosclerosis and aortic atherosclerosis.   Electronically Signed  By: Ilona Sorrel M.D.  On: 03/16/2016 15:40  RADIOGRAPHIC STUDIES: I have personally reviewed the radiological images as listed and agreed with the findings in the report. No results found.   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   # Continue Taxol-Topotecan- Avastin # 6. Labs reviewed. Will plan to get CT after this cycle. Discussed re: maintenance avastin after few weeks of  Chemo break.   # Lumbar pain -on RT; improved. On X-geva q 6 W  #.Elevated BPs-sec to avastin- recommend checking- blood pressure at home; and call us- if still high add metoprolol.   # PN G-1 sec to Taxol.   # follow up with me in 3 weeks/cbc-cmp/UA; no plans for treatment at that visit.    Orders Placed This Encounter   Procedures  . CT ABDOMEN PELVIS W CONTRAST    Standing Status:   Future    Standing Expiration Date:   08/03/2017    Order Specific Question:   Reason for Exam (SYMPTOM  OR DIAGNOSIS REQUIRED)    Answer:   cervical cancer with mets to lung    Order Specific Question:   Preferred imaging location?    Answer:   [facility]  . CT CHEST W CONTRAST    Standing Status:   Future    Standing Expiration Date:   07/04/2017    Order Specific Question:   Reason for Exam (SYMPTOM  OR DIAGNOSIS REQUIRED)    Answer:   cervical cancer with mets to lung    Order Specific Question:   Preferred imaging location?    Answer:   [facility]  . Urinalysis complete, with microscopic [facility])    Standing Status:   Future    Number of Occurrences:   1    Standing Expiration Date:   05/04/2017  . Comprehensive metabolic panel    Standing Status:   Future    Standing Expiration Date:   05/04/2017  . CBC with Differential    Standing Status:   Future    Standing Expiration Date:   05/04/2017   All questions were answered. The patient knows to call the clinic with any problems, questions or concerns    Cammie Sickle, MD 05/05/2016 7:36 AM        Cammie Sickle, MD 05/05/2016 7:36 AM

## 2016-05-04 NOTE — Progress Notes (Signed)
Patient's BP elevated this morning.  162/94 HR 82.  Patient states she has not taken her BP medication this morning.

## 2016-05-05 ENCOUNTER — Inpatient Hospital Stay: Payer: Medicare Other

## 2016-05-05 VITALS — BP 144/80 | HR 77 | Temp 97.3°F | Resp 18

## 2016-05-05 DIAGNOSIS — Z5111 Encounter for antineoplastic chemotherapy: Secondary | ICD-10-CM | POA: Diagnosis not present

## 2016-05-05 DIAGNOSIS — C539 Malignant neoplasm of cervix uteri, unspecified: Secondary | ICD-10-CM

## 2016-05-05 MED ORDER — PROCHLORPERAZINE MALEATE 10 MG PO TABS
10.0000 mg | ORAL_TABLET | Freq: Once | ORAL | Status: AC
  Administered 2016-05-05: 10 mg via ORAL
  Filled 2016-05-05: qty 1

## 2016-05-05 MED ORDER — HEPARIN SOD (PORK) LOCK FLUSH 100 UNIT/ML IV SOLN
500.0000 [IU] | Freq: Once | INTRAVENOUS | Status: AC | PRN
Start: 2016-05-05 — End: 2016-05-05
  Administered 2016-05-05: 500 [IU]
  Filled 2016-05-05: qty 5

## 2016-05-05 MED ORDER — SODIUM CHLORIDE 0.9 % IV SOLN
Freq: Once | INTRAVENOUS | Status: AC
  Administered 2016-05-05: 15:00:00 via INTRAVENOUS
  Filled 2016-05-05: qty 1000

## 2016-05-05 MED ORDER — SODIUM CHLORIDE 0.9% FLUSH
10.0000 mL | INTRAVENOUS | Status: DC | PRN
  Administered 2016-05-05: 10 mL
  Filled 2016-05-05: qty 10

## 2016-05-05 MED ORDER — TOPOTECAN HCL CHEMO INJECTION 4 MG
0.7500 mg/m2 | Freq: Once | INTRAVENOUS | Status: AC
  Administered 2016-05-05: 1.3 mg via INTRAVENOUS
  Filled 2016-05-05: qty 1.3

## 2016-05-06 ENCOUNTER — Inpatient Hospital Stay: Payer: Medicare Other

## 2016-05-06 VITALS — BP 134/91 | HR 86 | Temp 97.8°F | Resp 20

## 2016-05-06 DIAGNOSIS — Z5111 Encounter for antineoplastic chemotherapy: Secondary | ICD-10-CM | POA: Diagnosis not present

## 2016-05-06 DIAGNOSIS — C539 Malignant neoplasm of cervix uteri, unspecified: Secondary | ICD-10-CM

## 2016-05-06 MED ORDER — SODIUM CHLORIDE 0.9% FLUSH
10.0000 mL | INTRAVENOUS | Status: DC | PRN
  Administered 2016-05-06: 10 mL
  Filled 2016-05-06: qty 10

## 2016-05-06 MED ORDER — PEGFILGRASTIM 6 MG/0.6ML ~~LOC~~ PSKT
6.0000 mg | PREFILLED_SYRINGE | Freq: Once | SUBCUTANEOUS | Status: AC
  Administered 2016-05-06: 6 mg via SUBCUTANEOUS
  Filled 2016-05-06: qty 0.6

## 2016-05-06 MED ORDER — SODIUM CHLORIDE 0.9 % IV SOLN
Freq: Once | INTRAVENOUS | Status: AC
  Administered 2016-05-06: 14:00:00 via INTRAVENOUS
  Filled 2016-05-06: qty 1000

## 2016-05-06 MED ORDER — HEPARIN SOD (PORK) LOCK FLUSH 100 UNIT/ML IV SOLN
500.0000 [IU] | Freq: Once | INTRAVENOUS | Status: AC | PRN
  Administered 2016-05-06: 500 [IU]
  Filled 2016-05-06: qty 5

## 2016-05-06 MED ORDER — TOPOTECAN HCL CHEMO INJECTION 4 MG
0.7500 mg/m2 | Freq: Once | INTRAVENOUS | Status: AC
  Administered 2016-05-06: 1.3 mg via INTRAVENOUS
  Filled 2016-05-06: qty 1.3

## 2016-05-06 MED ORDER — PROCHLORPERAZINE MALEATE 10 MG PO TABS
10.0000 mg | ORAL_TABLET | Freq: Once | ORAL | Status: AC
  Administered 2016-05-06: 10 mg via ORAL
  Filled 2016-05-06: qty 1

## 2016-05-14 ENCOUNTER — Ambulatory Visit
Admission: RE | Admit: 2016-05-14 | Discharge: 2016-05-14 | Disposition: A | Discharge status: Home / Self Care | Payer: Medicare Other | Source: Ambulatory Visit | Attending: Internal Medicine | Admitting: Internal Medicine

## 2016-05-14 DIAGNOSIS — C799 Secondary malignant neoplasm of unspecified site: Secondary | ICD-10-CM | POA: Insufficient documentation

## 2016-05-14 DIAGNOSIS — R1909 Other intra-abdominal and pelvic swelling, mass and lump: Secondary | ICD-10-CM | POA: Insufficient documentation

## 2016-05-14 DIAGNOSIS — C539 Malignant neoplasm of cervix uteri, unspecified: Secondary | ICD-10-CM | POA: Diagnosis not present

## 2016-05-14 MED ORDER — IOPAMIDOL (ISOVUE-300) INJECTION 61%
100.0000 mL | Freq: Once | INTRAVENOUS | Status: AC | PRN
Start: 2016-05-14 — End: 2016-05-14
  Administered 2016-05-14: 100 mL via INTRAVENOUS

## 2016-05-25 ENCOUNTER — Inpatient Hospital Stay: Payer: Medicare Other | Attending: Internal Medicine | Admitting: Internal Medicine

## 2016-05-25 ENCOUNTER — Inpatient Hospital Stay: Payer: Medicare Other

## 2016-05-25 VITALS — BP 178/98 | HR 76 | Temp 97.3°F | Wt 132.0 lb

## 2016-05-25 DIAGNOSIS — C539 Malignant neoplasm of cervix uteri, unspecified: Secondary | ICD-10-CM

## 2016-05-25 DIAGNOSIS — I2510 Atherosclerotic heart disease of native coronary artery without angina pectoris: Secondary | ICD-10-CM

## 2016-05-25 DIAGNOSIS — F329 Major depressive disorder, single episode, unspecified: Secondary | ICD-10-CM | POA: Diagnosis not present

## 2016-05-25 DIAGNOSIS — Z8041 Family history of malignant neoplasm of ovary: Secondary | ICD-10-CM | POA: Diagnosis not present

## 2016-05-25 DIAGNOSIS — F419 Anxiety disorder, unspecified: Secondary | ICD-10-CM | POA: Insufficient documentation

## 2016-05-25 DIAGNOSIS — Z79899 Other long term (current) drug therapy: Secondary | ICD-10-CM

## 2016-05-25 DIAGNOSIS — R591 Generalized enlarged lymph nodes: Secondary | ICD-10-CM | POA: Diagnosis not present

## 2016-05-25 DIAGNOSIS — M545 Low back pain: Secondary | ICD-10-CM | POA: Diagnosis not present

## 2016-05-25 DIAGNOSIS — C799 Secondary malignant neoplasm of unspecified site: Secondary | ICD-10-CM | POA: Insufficient documentation

## 2016-05-25 DIAGNOSIS — R630 Anorexia: Secondary | ICD-10-CM

## 2016-05-25 DIAGNOSIS — R200 Anesthesia of skin: Secondary | ICD-10-CM | POA: Diagnosis not present

## 2016-05-25 DIAGNOSIS — I10 Essential (primary) hypertension: Secondary | ICD-10-CM

## 2016-05-25 DIAGNOSIS — Z803 Family history of malignant neoplasm of breast: Secondary | ICD-10-CM | POA: Diagnosis not present

## 2016-05-25 DIAGNOSIS — G5600 Carpal tunnel syndrome, unspecified upper limb: Secondary | ICD-10-CM

## 2016-05-25 DIAGNOSIS — R634 Abnormal weight loss: Secondary | ICD-10-CM | POA: Insufficient documentation

## 2016-05-25 DIAGNOSIS — R21 Rash and other nonspecific skin eruption: Secondary | ICD-10-CM | POA: Diagnosis not present

## 2016-05-25 DIAGNOSIS — C7800 Secondary malignant neoplasm of unspecified lung: Secondary | ICD-10-CM | POA: Diagnosis not present

## 2016-05-25 DIAGNOSIS — E279 Disorder of adrenal gland, unspecified: Secondary | ICD-10-CM | POA: Diagnosis not present

## 2016-05-25 DIAGNOSIS — R112 Nausea with vomiting, unspecified: Secondary | ICD-10-CM | POA: Diagnosis not present

## 2016-05-25 DIAGNOSIS — C775 Secondary and unspecified malignant neoplasm of intrapelvic lymph nodes: Secondary | ICD-10-CM | POA: Insufficient documentation

## 2016-05-25 DIAGNOSIS — R202 Paresthesia of skin: Secondary | ICD-10-CM | POA: Diagnosis not present

## 2016-05-25 DIAGNOSIS — Z7982 Long term (current) use of aspirin: Secondary | ICD-10-CM | POA: Diagnosis not present

## 2016-05-25 LAB — CBC WITH DIFFERENTIAL/PLATELET
BASOS PCT: 0 %
Basophils Absolute: 0 10*3/uL (ref 0–0.1)
Eosinophils Absolute: 0 10*3/uL (ref 0–0.7)
Eosinophils Relative: 0 %
HEMATOCRIT: 27.7 % — AB (ref 35.0–47.0)
HEMOGLOBIN: 9.3 g/dL — AB (ref 12.0–16.0)
LYMPHS ABS: 0.6 10*3/uL — AB (ref 1.0–3.6)
Lymphocytes Relative: 10 %
MCH: 30.3 pg (ref 26.0–34.0)
MCHC: 33.4 g/dL (ref 32.0–36.0)
MCV: 90.5 fL (ref 80.0–100.0)
Monocytes Absolute: 0.6 10*3/uL (ref 0.2–0.9)
Monocytes Relative: 11 %
NEUTROS ABS: 4.6 10*3/uL (ref 1.4–6.5)
NEUTROS PCT: 79 %
Platelets: 332 10*3/uL (ref 150–440)
RBC: 3.06 MIL/uL — AB (ref 3.80–5.20)
RDW: 18.7 % — ABNORMAL HIGH (ref 11.5–14.5)
WBC: 5.9 10*3/uL (ref 3.6–11.0)

## 2016-05-25 LAB — COMPREHENSIVE METABOLIC PANEL
ALT: 7 U/L — AB (ref 14–54)
ANION GAP: 9 (ref 5–15)
AST: 17 U/L (ref 15–41)
Albumin: 3.4 g/dL — ABNORMAL LOW (ref 3.5–5.0)
Alkaline Phosphatase: 80 U/L (ref 38–126)
BUN: 14 mg/dL (ref 6–20)
CALCIUM: 8.8 mg/dL — AB (ref 8.9–10.3)
CHLORIDE: 103 mmol/L (ref 101–111)
CO2: 22 mmol/L (ref 22–32)
CREATININE: 0.7 mg/dL (ref 0.44–1.00)
Glucose, Bld: 114 mg/dL — ABNORMAL HIGH (ref 65–99)
Potassium: 3.9 mmol/L (ref 3.5–5.1)
SODIUM: 134 mmol/L — AB (ref 135–145)
Total Bilirubin: 0.4 mg/dL (ref 0.3–1.2)
Total Protein: 7.3 g/dL (ref 6.5–8.1)

## 2016-05-25 NOTE — Progress Notes (Signed)
Patient ambulates without assistance, brought to exam room 19, accompanied by family.  Patient denies pain,patient c/o new onset of itching and poor appetite.

## 2016-05-25 NOTE — Progress Notes (Signed)
[place] OFFICE PROGRESS NOTE  Patient Care Team: Petra Kuba, MD as PCP - General (Family Medicine) Clent Jacks, RN as Registered Nurse  Primary cervical cancer with metastasis to other site [facility])   Staging form: Cervix Uteri, AJCC 7th Edition     Clinical: No stage assigned - Unsigned    Oncology History   # FEB 2017- STAGE IV ENDOCERVICAL vs endometrial [place] CA [s/p Inguinal LN Bx; s/p endocervical Bx- pos; p16 pos? endocervical] Lymphadenopathy-RP/Ingiunal LN; CT-PET bil pul nodules [sub-cm]/ MRI- pelvis; March 10th- Carbo-taxol x3 cycles- MAY CT- PROGRESSION  # MAY 2017- TAXOL- TOPOTECAN- AVASTIN; 03/16/2016-CT-Partial Response  # L-1spinous process lytic lesion [on PET]- not on X-geva. S/p RT [JULy 2017]   # MOLECULAR TESTING: MSI- STABLE     Primary cervical cancer with metastasis to other site [facility])     INTERVAL HISTORY:  A very pleasant 70 year old female patient with above history of endocervical cancer currently on second line therapy with Taxol topotecan Avastin is here for follow-up. Patient received cycle #6 approximately 3 weeks ago/Is here For follow-up.  Complains of tingling and numbness of the extremities; however no falls. Appetite poor. . She feels tired few Days after chemotherapy.   Patient otherwise denies any headaches. Denies any nosebleeds or swelling of the legs. Complains of skin itching on the face/ upper extremities. She is interested in taking a break from chemotherapy   REVIEW OF SYSTEMS:  A complete 10 point review of system is done which is negative except mentioned above/history of present illness.   PAST MEDICAL HISTORY :  Past Medical History:  Diagnosis Date  . Anxiety   . Carpal tunnel syndrome   . Chemotherapy-induced nausea and vomiting   . Decrease in appetite   . Decreased appetite   . Depression   . Endometrial cancer ([place]) 11/06/2015  .  Hypertension   . Lymphadenopathy 10/25/15  . Nausea & vomiting   . Port-a-cath in place   . Weight loss    30 lb weight loss since October 2016  . Weight loss     PAST SURGICAL HISTORY :   Past Surgical History:  Procedure Laterality Date  . CARPAL TUNNEL RELEASE Left   . FLEXIBLE SIGMOIDOSCOPY  05/2015   performed by Dr. Petra Kuba,  . PERIPHERAL VASCULAR CATHETERIZATION N/A 02/25/2016   Procedure: Glori Luis Cath Insertion;  Surgeon: Katha Cabal, MD;  Location: [place] CV LAB;  Service: Cardiovascular;  Laterality: N/A;  . SKIN CANCER EXCISION      FAMILY HISTORY :   Family History  Problem Relation Age of Onset  . Breast cancer Sister   . Hypertension Sister   . Hypertension Brother   . Ovarian cancer Paternal Aunt   . Diabetes Maternal Grandmother   . Breast cancer Daughter   . Breast cancer Cousin     maternal    SOCIAL HISTORY:   Social History  Substance Use Topics  . Smoking status: Never Smoker  . Smokeless tobacco: Never Used  . Alcohol use No    ALLERGIES:  is allergic to ibuprofen and tape.  MEDICATIONS:  Current Outpatient Prescriptions  Medication Sig Dispense Refill  . amLODipine (NORVASC) 10 MG tablet Take 1 tablet by mouth daily. Reported on 11/25/2015    . aspirin EC 81 MG tablet Take 81 mg by mouth daily. Reported on 01/31/2016    . dronabinol (MARINOL) 5 MG capsule Take 1 capsule (  5 mg total) by mouth 2 (two) times daily before a meal. 60 capsule 1  . hydrochlorothiazide (HYDRODIURIL) 25 MG tablet Take 1 tablet by mouth daily. Reported on 01/31/2016    . HYDROcodone-acetaminophen (NORCO/VICODIN) 5-325 MG tablet Take 1 tablet by mouth every 6 (six) hours as needed for moderate pain. 40 tablet 0  . lidocaine-prilocaine (EMLA) cream Apply 1 application topically as needed. To port site 30 g 0  . losartan (COZAAR) 100 MG tablet Take 1 tablet by mouth daily. Reported on 11/25/2015    . ondansetron (ZOFRAN) 8 MG tablet Start 3 days after  chemo (Patient not taking: Reported on 05/25/2016) 40 tablet 0  . prochlorperazine (COMPAZINE) 10 MG tablet Take 1 tablet (10 mg total) by mouth every 6 (six) hours as needed for nausea or vomiting. (Patient not taking: Reported on 05/25/2016) 40 tablet 0  . promethazine (PHENERGAN) 25 MG tablet Take 1 tablet (25 mg total) by mouth every 6 (six) hours as needed for nausea or vomiting. (Patient not taking: Reported on 05/25/2016) 20 tablet 0   No current facility-administered medications for this visit.     PHYSICAL EXAMINATION: ECOG PERFORMANCE STATUS: 1 - Symptomatic but completely ambulatory  BP (!) 178/98 (BP Location: Left Arm, Patient Position: Sitting)   Pulse 76   Temp 97.3 F (36.3 C) (Tympanic)   Wt 132 lb (59.9 kg)   BMI 20.67 kg/m   Filed Weights   05/25/16 0954  Weight: 132 lb (59.9 kg)    GENERAL:Thin built moderately nourished female patient  Alert, no distress and comfortable.   Accompanied by husband/daughter/son-in-law EYES: no pallor or icterus OROPHARYNX: no thrush or ulceration; good dentition  NECK: supple, no masses felt LYMPH:  no palpable lymphadenopathy in the cervical, axillary region. Left inguinal region ~1cm felt improved.  LUNGS: clear to auscultation and  No wheeze or crackles HEART/CVS: regular rate & rhythm and no murmurs; No lower extremity edema ABDOMEN:abdomen soft, non-tender and normal bowel sounds Musculoskeletal:no cyanosis of digits and no clubbing  PSYCH: alert & oriented x 3 with fluent speech NEURO: no focal motor/sensory deficits SKIN:  no rashes or significant lesions  LABORATORY DATA:  I have reviewed the data as listed    Component Value Date/Time   NA 134 (L) 05/25/2016 0852   K 3.9 05/25/2016 0852   CL 103 05/25/2016 0852   CO2 22 05/25/2016 0852   GLUCOSE 114 (H) 05/25/2016 0852   BUN 14 05/25/2016 0852   CREATININE 0.70 05/25/2016 0852   CALCIUM 8.8 (L) 05/25/2016 0852   PROT 7.3 05/25/2016 0852   ALBUMIN 3.4 (L)  05/25/2016 0852   AST 17 05/25/2016 0852   ALT 7 (L) 05/25/2016 0852   ALKPHOS 80 05/25/2016 0852   BILITOT 0.4 05/25/2016 0852   GFRNONAA >60 05/25/2016 0852   GFRAA >60 05/25/2016 0852    No results found for: SPEP, UPEP  Lab Results  Component Value Date   WBC 5.9 05/25/2016   NEUTROABS 4.6 05/25/2016   HGB 9.3 (L) 05/25/2016   HCT 27.7 (L) 05/25/2016   MCV 90.5 05/25/2016   PLT 332 05/25/2016      Chemistry      Component Value Date/Time   NA 134 (L) 05/25/2016 0852   K 3.9 05/25/2016 0852   CL 103 05/25/2016 0852   CO2 22 05/25/2016 0852   BUN 14 05/25/2016 0852   CREATININE 0.70 05/25/2016 0852      Component Value Date/Time   CALCIUM 8.8 (L) 05/25/2016  0852   ALKPHOS 80 05/25/2016 0852   AST 17 05/25/2016 0852   ALT 7 (L) 05/25/2016 0852   BILITOT 0.4 05/25/2016 0852     IMPRESSION: 1. Partial interval treatment response. Pulmonary metastases, retroperitoneal and bilateral pelvic nodal metastases, endocervical mass, bilateral adnexal masses and appendiceal tip serosal implants are all mildly decreased in size. No new or progressive metastatic disease. 2. Additional findings include coronary atherosclerosis and aortic atherosclerosis.   Electronically Signed  By: Ilona Sorrel M.D.  On: 03/16/2016 15:40  RADIOGRAPHIC STUDIES: I have personally reviewed the radiological images as listed and agreed with the findings in the report. No results found.   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 Will also check with Dr.Berchuck.   #  Discussed re: maintenance avastin q 3 w; starting in 6 weeks.   # Lumbar pain -on RT; improved. On X-geva q 6 W; we will restart the Avastin maintenance.  #.Elevated BPs-sec to avastin-improved.  Monitor blood pressure at home;   # Skin rash mostly sun  exposed areas of the skin- recommend hydrocortisone cream sunblock protection.  -# PN G-1 sec to Taxol.   # follow up with me in 6 weeks/cbc-cmp/UA; Avastin only-  treatment at that visit.    Orders Placed This Encounter  Procedures  . CBC with Differential    Standing Status:   Future    Standing Expiration Date:   05/25/2017  . Comprehensive metabolic panel    Standing Status:   Future    Standing Expiration Date:   05/25/2017  . CA 125    Standing Status:   Future    Standing Expiration Date:   05/25/2017   All questions were answered. The patient knows to call the clinic with any problems, questions or concerns    Cammie Sickle, MD 05/25/2016 1:32 PM        Cammie Sickle, MD 05/25/2016 1:32 PM

## 2016-05-25 NOTE — Assessment & Plan Note (Addendum)
Stage IV metastatic endocervical cancer currently on therapy with topotecan Taxol Avastin status post cycle #6; SEP 7th  CT- PR. Will also check with Dr.Berchuck.   #  Discussed re: maintenance avastin q 3 w; starting in 6 weeks.   # Lumbar pain -on RT; improved. On X-geva q 6 W; we will restart the Avastin maintenance.  #.Elevated BPs-sec to avastin-improved.  Monitor blood pressure at home;   # Skin rash mostly sun exposed areas of the skin- recommend hydrocortisone cream sunblock protection.  -# PN G-1 sec to Taxol.   # follow up with me in 6 weeks/cbc-cmp/UA; Avastin only-  treatment at that visit.

## 2016-06-04 ENCOUNTER — Other Ambulatory Visit: Payer: Self-pay | Admitting: *Deleted

## 2016-06-04 DIAGNOSIS — C539 Malignant neoplasm of cervix uteri, unspecified: Secondary | ICD-10-CM

## 2016-06-04 MED ORDER — HYDROCODONE-ACETAMINOPHEN 5-325 MG PO TABS: 1.0000 | ORAL_TABLET | Freq: Four times a day (QID) | ORAL | 0 refills | Status: DC | PRN

## 2016-06-04 NOTE — Telephone Encounter (Signed)
Faxed then mailed hard copy per ok with [facility]. [facility] has closed down

## 2016-06-18 ENCOUNTER — Telehealth: Payer: Self-pay | Admitting: *Deleted

## 2016-06-18 NOTE — Telephone Encounter (Signed)
OK per Dr Brahmanday. Patient informed 

## 2016-06-18 NOTE — Telephone Encounter (Signed)
Asking if she can get the flu shot

## 2016-07-06 ENCOUNTER — Inpatient Hospital Stay: Payer: Medicare Other

## 2016-07-06 ENCOUNTER — Inpatient Hospital Stay: Payer: Medicare Other | Attending: Internal Medicine | Admitting: Internal Medicine

## 2016-07-06 VITALS — BP 124/74 | HR 68 | Resp 20

## 2016-07-06 VITALS — BP 145/76 | HR 91 | Temp 96.6°F | Ht 67.0 in | Wt 128.4 lb

## 2016-07-06 DIAGNOSIS — Z8041 Family history of malignant neoplasm of ovary: Secondary | ICD-10-CM | POA: Diagnosis not present

## 2016-07-06 DIAGNOSIS — C799 Secondary malignant neoplasm of unspecified site: Secondary | ICD-10-CM | POA: Diagnosis not present

## 2016-07-06 DIAGNOSIS — R971 Elevated cancer antigen 125 [CA 125]: Secondary | ICD-10-CM | POA: Diagnosis not present

## 2016-07-06 DIAGNOSIS — Z85828 Personal history of other malignant neoplasm of skin: Secondary | ICD-10-CM | POA: Diagnosis not present

## 2016-07-06 DIAGNOSIS — Z79899 Other long term (current) drug therapy: Secondary | ICD-10-CM | POA: Diagnosis not present

## 2016-07-06 DIAGNOSIS — F329 Major depressive disorder, single episode, unspecified: Secondary | ICD-10-CM | POA: Insufficient documentation

## 2016-07-06 DIAGNOSIS — F419 Anxiety disorder, unspecified: Secondary | ICD-10-CM | POA: Diagnosis not present

## 2016-07-06 DIAGNOSIS — Z923 Personal history of irradiation: Secondary | ICD-10-CM | POA: Insufficient documentation

## 2016-07-06 DIAGNOSIS — M799 Soft tissue disorder, unspecified: Secondary | ICD-10-CM | POA: Diagnosis not present

## 2016-07-06 DIAGNOSIS — C539 Malignant neoplasm of cervix uteri, unspecified: Secondary | ICD-10-CM | POA: Insufficient documentation

## 2016-07-06 DIAGNOSIS — M545 Low back pain: Secondary | ICD-10-CM | POA: Diagnosis not present

## 2016-07-06 DIAGNOSIS — Z9221 Personal history of antineoplastic chemotherapy: Secondary | ICD-10-CM | POA: Diagnosis not present

## 2016-07-06 DIAGNOSIS — Z5112 Encounter for antineoplastic immunotherapy: Secondary | ICD-10-CM | POA: Diagnosis not present

## 2016-07-06 DIAGNOSIS — R112 Nausea with vomiting, unspecified: Secondary | ICD-10-CM | POA: Insufficient documentation

## 2016-07-06 DIAGNOSIS — G5600 Carpal tunnel syndrome, unspecified upper limb: Secondary | ICD-10-CM | POA: Insufficient documentation

## 2016-07-06 DIAGNOSIS — R630 Anorexia: Secondary | ICD-10-CM | POA: Insufficient documentation

## 2016-07-06 DIAGNOSIS — R5383 Other fatigue: Secondary | ICD-10-CM

## 2016-07-06 DIAGNOSIS — Z7982 Long term (current) use of aspirin: Secondary | ICD-10-CM | POA: Diagnosis not present

## 2016-07-06 DIAGNOSIS — Z803 Family history of malignant neoplasm of breast: Secondary | ICD-10-CM | POA: Diagnosis not present

## 2016-07-06 DIAGNOSIS — D649 Anemia, unspecified: Secondary | ICD-10-CM | POA: Diagnosis not present

## 2016-07-06 DIAGNOSIS — I10 Essential (primary) hypertension: Secondary | ICD-10-CM | POA: Insufficient documentation

## 2016-07-06 DIAGNOSIS — R918 Other nonspecific abnormal finding of lung field: Secondary | ICD-10-CM

## 2016-07-06 LAB — CBC WITH DIFFERENTIAL/PLATELET
BASOS ABS: 0 10*3/uL (ref 0–0.1)
BASOS PCT: 0 %
Eosinophils Absolute: 0 10*3/uL (ref 0–0.7)
Eosinophils Relative: 0 %
HEMATOCRIT: 29.3 % — AB (ref 35.0–47.0)
HEMOGLOBIN: 9.6 g/dL — AB (ref 12.0–16.0)
LYMPHS PCT: 8 %
Lymphs Abs: 0.3 10*3/uL — ABNORMAL LOW (ref 1.0–3.6)
MCH: 26.4 pg (ref 26.0–34.0)
MCHC: 32.8 g/dL (ref 32.0–36.0)
MCV: 80.4 fL (ref 80.0–100.0)
Monocytes Absolute: 0.3 10*3/uL (ref 0.2–0.9)
Monocytes Relative: 8 %
NEUTROS ABS: 3.4 10*3/uL (ref 1.4–6.5)
NEUTROS PCT: 84 %
Platelets: 206 10*3/uL (ref 150–440)
RBC: 3.64 MIL/uL — AB (ref 3.80–5.20)
RDW: 18.5 % — AB (ref 11.5–14.5)
WBC: 4.1 10*3/uL (ref 3.6–11.0)

## 2016-07-06 LAB — COMPREHENSIVE METABOLIC PANEL
ALBUMIN: 3.1 g/dL — AB (ref 3.5–5.0)
ALK PHOS: 73 U/L (ref 38–126)
ALT: 8 U/L — AB (ref 14–54)
AST: 18 U/L (ref 15–41)
Anion gap: 11 (ref 5–15)
BILIRUBIN TOTAL: 0.5 mg/dL (ref 0.3–1.2)
BUN: 15 mg/dL (ref 6–20)
CO2: 22 mmol/L (ref 22–32)
CREATININE: 0.67 mg/dL (ref 0.44–1.00)
Calcium: 8.8 mg/dL — ABNORMAL LOW (ref 8.9–10.3)
Chloride: 98 mmol/L — ABNORMAL LOW (ref 101–111)
GFR calc Af Amer: >60 mL/min (ref >60)
GLUCOSE: 141 mg/dL — AB (ref 65–99)
POTASSIUM: 3.7 mmol/L (ref 3.5–5.1)
Sodium: 131 mmol/L — ABNORMAL LOW (ref 135–145)
TOTAL PROTEIN: 7.8 g/dL (ref 6.5–8.1)

## 2016-07-06 MED ORDER — HEPARIN SOD (PORK) LOCK FLUSH 100 UNIT/ML IV SOLN
500.0000 [IU] | Freq: Once | INTRAVENOUS | Status: AC | PRN
  Administered 2016-07-06: 500 [IU]
  Filled 2016-07-06: qty 5

## 2016-07-06 MED ORDER — SODIUM CHLORIDE 0.9 % IV SOLN
15.0000 mg/kg | Freq: Once | INTRAVENOUS | Status: AC
  Administered 2016-07-06: 950 mg via INTRAVENOUS
  Filled 2016-07-06: qty 38

## 2016-07-06 MED ORDER — SODIUM CHLORIDE 0.9 % IV SOLN
Freq: Once | INTRAVENOUS | Status: AC
  Administered 2016-07-06: 13:00:00 via INTRAVENOUS
  Filled 2016-07-06: qty 1000

## 2016-07-06 MED ORDER — CITALOPRAM HYDROBROMIDE 20 MG PO TABS: 20.0000 mg | ORAL_TABLET | Freq: Every day | ORAL | 2 refills | Status: DC

## 2016-07-06 NOTE — Progress Notes (Signed)
[place] OFFICE PROGRESS NOTE  Patient Care Team: Petra Kuba, MD as PCP - General (Family Medicine) Clent Jacks, RN as Registered Nurse  Primary cervical cancer with metastasis to other site [facility])   Staging form: Cervix Uteri, AJCC 7th Edition     Clinical: No stage assigned - Unsigned    Oncology History   # FEB 2017- STAGE IV ENDOCERVICAL vs endometrial ADENO CA [s/p Inguinal LN Bx; s/p endocervical Bx- pos; p16 pos? endocervical] Lymphadenopathy-RP/Ingiunal LN; CT-PET bil pul nodules [sub-cm]/ MRI- pelvis; March 10th- Carbo-taxol x3 cycles- MAY CT- PROGRESSION  # MAY 2017- TAXOL- TOPOTECAN- AVASTIN; 03/16/2016-CT-Partial Response; SEP 7th 2017- Partial response.   # OCT 30th 2017- Avastin q 3 W [maintenance]  # L-1spinous process lytic lesion [on PET]- not on X-geva. S/p RT [JULy 2017]   # MOLECULAR TESTING: MSI- STABLE     Primary cervical cancer with metastasis to other site [facility])     INTERVAL HISTORY:  A very pleasant 70 year old female patient with above history of endocervical cancer currently on second line therapy with Taxol topotecan Avastin; Status post 6 cycles is here for follow-up. Patient was given a "chemotherapy holiday" for about 6 weeks at her request.  Patient continues to feel poorly- complains of poor appetite. Positive for weight loss. Extreme fatigue. Anxious.; Feels depressed. She does not want to go out and mingle with friends and family. Denies any suicidal ideation. She has not been taking Marinol. Denies any significant pain.  Patient otherwise denies any headaches. Denies any nosebleeds or swelling of the legs.   REVIEW OF SYSTEMS:  A complete 10 point review of system is done which is negative except mentioned above/history of present illness.   PAST MEDICAL HISTORY :  Past Medical History:  Diagnosis Date  . Anxiety   . Carpal tunnel syndrome   .  Chemotherapy-induced nausea and vomiting   . Decrease in appetite   . Decreased appetite   . Depression   . Endometrial cancer ([place]) 11/06/2015  . Hypertension   . Lymphadenopathy 10/25/15  . Nausea & vomiting   . Port-a-cath in place   . Weight loss    30 lb weight loss since October 2016  . Weight loss     PAST SURGICAL HISTORY :   Past Surgical History:  Procedure Laterality Date  . CARPAL TUNNEL RELEASE Left   . FLEXIBLE SIGMOIDOSCOPY  05/2015   performed by Dr. Petra Kuba,  . PERIPHERAL VASCULAR CATHETERIZATION N/A 02/25/2016   Procedure: Glori Luis Cath Insertion;  Surgeon: Katha Cabal, MD;  Location: [place] [facility];  Service: Cardiovascular;  Laterality: N/A;  . SKIN CANCER EXCISION      FAMILY HISTORY :   Family History  Problem Relation Age of Onset  . Breast cancer Sister   . Hypertension Sister   . Hypertension Brother   . Ovarian cancer Paternal Aunt   . Diabetes Maternal Grandmother   . Breast cancer Daughter   . Breast cancer Cousin     maternal    SOCIAL HISTORY:   Social History  Substance Use Topics  . Smoking status: Never Smoker  . Smokeless tobacco: Never Used  . Alcohol use No    ALLERGIES:  is allergic to ibuprofen and tape.  MEDICATIONS:  Current Outpatient Prescriptions  Medication Sig Dispense Refill  . amLODipine (NORVASC) 10 MG tablet Take 1 tablet by mouth daily. Reported on 11/25/2015    .  aspirin EC 81 MG tablet Take 81 mg by mouth daily. Reported on 01/31/2016    . hydrochlorothiazide (HYDRODIURIL) 25 MG tablet Take 1 tablet by mouth daily. Reported on 01/31/2016    . lidocaine-prilocaine (EMLA) cream Apply 1 application topically as needed. To port site 30 g 0  . losartan (COZAAR) 100 MG tablet Take 1 tablet by mouth daily. Reported on 11/25/2015    . citalopram (CELEXA) 20 MG tablet Take 1 tablet (20 mg total) by mouth daily. 30 tablet 2  . dronabinol (MARINOL) 5 MG capsule Take 1 capsule (5 mg total) by mouth 2 (two)  times daily before a meal. (Patient not taking: Reported on 07/06/2016) 60 capsule 1  . HYDROcodone-acetaminophen (NORCO/VICODIN) 5-325 MG tablet Take 1 tablet by mouth every 6 (six) hours as needed for moderate pain. (Patient not taking: Reported on 07/06/2016) 40 tablet 0  . ondansetron (ZOFRAN) 8 MG tablet Start 3 days after chemo (Patient not taking: Reported on 07/06/2016) 40 tablet 0  . prochlorperazine (COMPAZINE) 10 MG tablet Take 1 tablet (10 mg total) by mouth every 6 (six) hours as needed for nausea or vomiting. (Patient not taking: Reported on 07/06/2016) 40 tablet 0  . promethazine (PHENERGAN) 25 MG tablet Take 1 tablet (25 mg total) by mouth every 6 (six) hours as needed for nausea or vomiting. (Patient not taking: Reported on 07/06/2016) 20 tablet 0   No current facility-administered medications for this visit.     PHYSICAL EXAMINATION: ECOG PERFORMANCE STATUS: 1 - Symptomatic but completely ambulatory  BP (!) 145/76 (BP Location: Left Arm, Patient Position: Sitting)   Pulse 91   Temp (!) 96.6 F (35.9 C) (Tympanic)   Ht 5' 7"  (1.702 m)   Wt 128 lb 6.4 oz (58.2 kg)   BMI 20.11 kg/m   Filed Weights   07/06/16 1039  Weight: 128 lb 6.4 oz (58.2 kg)    GENERAL:Thin built moderately nourished female patient  Alert, no distress and comfortable.   Accompanied by husband.  EYES: no pallor or icterus OROPHARYNX: no thrush or ulceration; good dentition  NECK: supple, no masses felt LYMPH:  no palpable lymphadenopathy in the cervical, axillary region. Left inguinal region ~1cm felt improved.  LUNGS: clear to auscultation and  No wheeze or crackles HEART/CVS: regular rate & rhythm and no murmurs; No lower extremity edema ABDOMEN:abdomen soft, non-tender and normal bowel sounds Musculoskeletal:no cyanosis of digits and no clubbing  PSYCH: alert & oriented x 3 with fluent speech NEURO: no focal motor/sensory deficits SKIN:  no rashes or significant lesions  LABORATORY DATA:   I have reviewed the data as listed    Component Value Date/Time   NA 131 (L) 07/06/2016 0917   K 3.7 07/06/2016 0917   CL 98 (L) 07/06/2016 0917   CO2 22 07/06/2016 0917   GLUCOSE 141 (H) 07/06/2016 0917   BUN 15 07/06/2016 0917   CREATININE 0.67 07/06/2016 0917   CALCIUM 8.8 (L) 07/06/2016 0917   PROT 7.8 07/06/2016 0917   ALBUMIN 3.1 (L) 07/06/2016 0917   AST 18 07/06/2016 0917   ALT 8 (L) 07/06/2016 0917   ALKPHOS 73 07/06/2016 0917   BILITOT 0.5 07/06/2016 0917   GFRNONAA >60 07/06/2016 0917   GFRAA >60 07/06/2016 0917    No results found for: SPEP, UPEP  Lab Results  Component Value Date   WBC 4.1 07/06/2016   NEUTROABS 3.4 07/06/2016   HGB 9.6 (L) 07/06/2016   HCT 29.3 (L) 07/06/2016   MCV 80.4  07/06/2016   PLT 206 07/06/2016      Chemistry      Component Value Date/Time   NA 131 (L) 07/06/2016 0917   K 3.7 07/06/2016 0917   CL 98 (L) 07/06/2016 0917   CO2 22 07/06/2016 0917   BUN 15 07/06/2016 0917   CREATININE 0.67 07/06/2016 0917      Component Value Date/Time   CALCIUM 8.8 (L) 07/06/2016 0917   ALKPHOS 73 07/06/2016 0917   AST 18 07/06/2016 0917   ALT 8 (L) 07/06/2016 0917   BILITOT 0.5 07/06/2016 0917       RADIOGRAPHIC STUDIES: I have personally reviewed the radiological images as listed and agreed with the findings in the report. No results found.   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 Patient was given a chemotherapy holiday for 6 weeks.   # Re-Start Avastin  q3 W today. Labs are adequate except for mild anemia. Hemoglobin 9.  # Lumbar pain -on RT; improved. On X-geva q 6 W;- start next cycle.  No pain.  # Depression [poor appetite and chronic mild nausea; fatigue; does not want to go out]- recommend Celexa 20 mg; discuss that it might take up to 4 weeks to note any  improvement.  # follow up with me in 3 weeks/cbc-cmp/UA; Avastin/ X-geva only.    Orders Placed This Encounter  Procedures  . CA 125    Standing Status:   Future    Standing Expiration Date:   07/06/2017   All questions were answered. The patient knows to call the clinic with any problems, questions or concerns    Cammie Sickle, MD 07/07/2016 8:53 AM        Cammie Sickle, MD 07/07/2016 8:53 AM

## 2016-07-06 NOTE — Assessment & Plan Note (Addendum)
Stage IV metastatic endocervical cancer currently on therapy with topotecan Taxol Avastin status post cycle #6; SEP 7th  CT- PR. Patient was given a chemotherapy holiday for 6 weeks.   # Re-Start Avastin  q3 W today. Labs are adequate except for mild anemia. Hemoglobin 9.  # Lumbar pain -on RT; improved. On X-geva q 6 W;- start next cycle.  No pain.  # Depression [poor appetite and chronic mild nausea; fatigue; does not want to go out]- recommend Celexa 20 mg; discuss that it might take up to 4 weeks to note any improvement.  # follow up with me in 3 weeks/cbc-cmp/UA; Avastin/ X-geva only.

## 2016-07-06 NOTE — Progress Notes (Signed)
Patient here for treat menat check. Patient has little or no appetitite. Complaints of nausea and vomitting this past six weeks. Patient is not taking prescribed anti ametics or appetite stimulant.

## 2016-07-07 ENCOUNTER — Telehealth: Payer: Self-pay | Admitting: Internal Medicine

## 2016-07-07 ENCOUNTER — Other Ambulatory Visit: Payer: Self-pay | Admitting: *Deleted

## 2016-07-07 DIAGNOSIS — C541 Malignant neoplasm of endometrium: Secondary | ICD-10-CM

## 2016-07-07 DIAGNOSIS — R21 Rash and other nonspecific skin eruption: Secondary | ICD-10-CM

## 2016-07-07 LAB — CA 125: CA 125: 29.5 U/mL (ref 0.0–38.1)

## 2016-07-07 MED ORDER — DRONABINOL 5 MG PO CAPS: 5.0000 mg | ORAL_CAPSULE | Freq: Two times a day (BID) | ORAL | 1 refills | Status: DC

## 2016-07-07 NOTE — Telephone Encounter (Signed)
Received call from [employer]. Pt needs a Rx for Marinol sent to [facility]. Ph. [phone] and fax: [phone].

## 2016-07-07 NOTE — Telephone Encounter (Signed)
rx faxed to [facility].

## 2016-07-07 NOTE — Telephone Encounter (Signed)
Pharmacy is request rx be faxed for Marinol, I see it was refilled yesterday, but they do not have the rx. Please reprint sign and fax to them. THank you

## 2016-07-27 ENCOUNTER — Inpatient Hospital Stay: Payer: Medicare Other

## 2016-07-27 ENCOUNTER — Inpatient Hospital Stay (HOSPITAL_BASED_OUTPATIENT_CLINIC_OR_DEPARTMENT_OTHER): Payer: Medicare Other | Admitting: Internal Medicine

## 2016-07-27 ENCOUNTER — Inpatient Hospital Stay: Payer: Medicare Other | Attending: Internal Medicine

## 2016-07-27 VITALS — BP 143/93 | HR 66 | Temp 96.8°F | Resp 18 | Wt 126.0 lb

## 2016-07-27 DIAGNOSIS — R591 Generalized enlarged lymph nodes: Secondary | ICD-10-CM

## 2016-07-27 DIAGNOSIS — I10 Essential (primary) hypertension: Secondary | ICD-10-CM

## 2016-07-27 DIAGNOSIS — R630 Anorexia: Secondary | ICD-10-CM | POA: Insufficient documentation

## 2016-07-27 DIAGNOSIS — G5600 Carpal tunnel syndrome, unspecified upper limb: Secondary | ICD-10-CM

## 2016-07-27 DIAGNOSIS — F419 Anxiety disorder, unspecified: Secondary | ICD-10-CM | POA: Diagnosis not present

## 2016-07-27 DIAGNOSIS — Z923 Personal history of irradiation: Secondary | ICD-10-CM | POA: Insufficient documentation

## 2016-07-27 DIAGNOSIS — Z79899 Other long term (current) drug therapy: Secondary | ICD-10-CM | POA: Insufficient documentation

## 2016-07-27 DIAGNOSIS — C799 Secondary malignant neoplasm of unspecified site: Secondary | ICD-10-CM | POA: Diagnosis not present

## 2016-07-27 DIAGNOSIS — Z803 Family history of malignant neoplasm of breast: Secondary | ICD-10-CM | POA: Insufficient documentation

## 2016-07-27 DIAGNOSIS — R112 Nausea with vomiting, unspecified: Secondary | ICD-10-CM

## 2016-07-27 DIAGNOSIS — Z8041 Family history of malignant neoplasm of ovary: Secondary | ICD-10-CM | POA: Diagnosis not present

## 2016-07-27 DIAGNOSIS — D649 Anemia, unspecified: Secondary | ICD-10-CM | POA: Insufficient documentation

## 2016-07-27 DIAGNOSIS — C539 Malignant neoplasm of cervix uteri, unspecified: Secondary | ICD-10-CM | POA: Diagnosis not present

## 2016-07-27 DIAGNOSIS — Z7982 Long term (current) use of aspirin: Secondary | ICD-10-CM | POA: Diagnosis not present

## 2016-07-27 DIAGNOSIS — R7982 Elevated C-reactive protein (CRP): Secondary | ICD-10-CM

## 2016-07-27 DIAGNOSIS — E860 Dehydration: Secondary | ICD-10-CM

## 2016-07-27 DIAGNOSIS — R634 Abnormal weight loss: Secondary | ICD-10-CM | POA: Diagnosis not present

## 2016-07-27 DIAGNOSIS — F329 Major depressive disorder, single episode, unspecified: Secondary | ICD-10-CM | POA: Diagnosis not present

## 2016-07-27 DIAGNOSIS — Z5111 Encounter for antineoplastic chemotherapy: Secondary | ICD-10-CM | POA: Diagnosis present

## 2016-07-27 DIAGNOSIS — M549 Dorsalgia, unspecified: Secondary | ICD-10-CM

## 2016-07-27 LAB — CBC WITH DIFFERENTIAL/PLATELET
Basophils Absolute: 0 K/uL (ref 0–0.1)
Basophils Relative: 0 %
Eosinophils Absolute: 0 K/uL (ref 0–0.7)
Eosinophils Relative: 1 %
HCT: 29.6 % — ABNORMAL LOW (ref 35.0–47.0)
Hemoglobin: 9.6 g/dL — ABNORMAL LOW (ref 12.0–16.0)
Lymphocytes Relative: 13 %
Lymphs Abs: 0.5 K/uL — ABNORMAL LOW (ref 1.0–3.6)
MCH: 25 pg — ABNORMAL LOW (ref 26.0–34.0)
MCHC: 32.5 g/dL (ref 32.0–36.0)
MCV: 76.9 fL — ABNORMAL LOW (ref 80.0–100.0)
Monocytes Absolute: 0.4 K/uL (ref 0.2–0.9)
Monocytes Relative: 11 %
Neutro Abs: 2.9 K/uL (ref 1.4–6.5)
Neutrophils Relative %: 75 %
Platelets: 259 K/uL (ref 150–440)
RBC: 3.85 MIL/uL (ref 3.80–5.20)
RDW: 18.8 % — ABNORMAL HIGH (ref 11.5–14.5)
WBC: 3.8 K/uL (ref 3.6–11.0)

## 2016-07-27 LAB — COMPREHENSIVE METABOLIC PANEL
ALBUMIN: 3.1 g/dL — AB (ref 3.5–5.0)
ALK PHOS: 66 U/L (ref 38–126)
ALT: 9 U/L — ABNORMAL LOW (ref 14–54)
ANION GAP: 5 (ref 5–15)
AST: 19 U/L (ref 15–41)
BUN: 12 mg/dL (ref 6–20)
CALCIUM: 9.2 mg/dL (ref 8.9–10.3)
CO2: 24 mmol/L (ref 22–32)
Chloride: 102 mmol/L (ref 101–111)
Creatinine, Ser: 0.68 mg/dL (ref 0.44–1.00)
GFR calc Af Amer: >60 mL/min (ref >60)
GFR calc non Af Amer: >60 mL/min (ref >60)
GLUCOSE: 108 mg/dL — AB (ref 65–99)
Potassium: 4.1 mmol/L (ref 3.5–5.1)
SODIUM: 131 mmol/L — AB (ref 135–145)
Total Bilirubin: 0.5 mg/dL (ref 0.3–1.2)
Total Protein: 8.2 g/dL — ABNORMAL HIGH (ref 6.5–8.1)

## 2016-07-27 MED ORDER — SODIUM CHLORIDE 0.9 % IV SOLN
15.0000 mg/kg | Freq: Once | INTRAVENOUS | Status: AC
  Administered 2016-07-27: 950 mg via INTRAVENOUS
  Filled 2016-07-27: qty 32

## 2016-07-27 MED ORDER — SODIUM CHLORIDE 0.9 % IV SOLN
Freq: Once | INTRAVENOUS | Status: AC
  Administered 2016-07-27: 12:00:00 via INTRAVENOUS
  Filled 2016-07-27: qty 1000

## 2016-07-27 MED ORDER — DENOSUMAB 120 MG/1.7ML ~~LOC~~ SOLN
120.0000 mg | Freq: Once | SUBCUTANEOUS | Status: AC
  Administered 2016-07-27: 120 mg via SUBCUTANEOUS
  Filled 2016-07-27: qty 1.7

## 2016-07-27 MED ORDER — SODIUM CHLORIDE 0.9% FLUSH
10.0000 mL | INTRAVENOUS | Status: DC | PRN
  Administered 2016-07-27: 10 mL
  Filled 2016-07-27: qty 10

## 2016-07-27 MED ORDER — CITALOPRAM HYDROBROMIDE 40 MG PO TABS: 40.0000 mg | ORAL_TABLET | Freq: Every day | ORAL | 3 refills | Status: DC

## 2016-07-27 MED ORDER — HEPARIN SOD (PORK) LOCK FLUSH 100 UNIT/ML IV SOLN
500.0000 [IU] | Freq: Once | INTRAVENOUS | Status: AC | PRN
  Administered 2016-07-27: 500 [IU]
  Filled 2016-07-27: qty 5

## 2016-07-27 NOTE — Assessment & Plan Note (Signed)
Stage IV metastatic endocervical cancer currently on therapy with topotecan Taxol Avastin status post cycle #6; SEP 7th  CT- PR.   # continue Avastin  q3 W today. Labs are adequate except for mild anemia. Hemoglobin 9. Stable.   # Lumbar pain -on RT; improved. On X-geva q 6 W;-start today again; ca-is okay.   # Depression [poor appetite and chronic mild nausea; fatigue; does not want to go out]- increase to 40 mg once a day.  # weight loss/ poor apetitie- recommend referral to Dietician.   # follow up with me in 3 weeks/cbc-cmp/UA; Avastin.

## 2016-07-27 NOTE — Progress Notes (Signed)
Patient is here for follow up, she does get fatigued. Her husband is worried about her eating habits. She will eat two bites of food and feel full.

## 2016-07-27 NOTE — Progress Notes (Signed)
[place] OFFICE PROGRESS NOTE  Patient Care Team: Petra Kuba, MD as PCP - General (Family Medicine) Clent Jacks, RN as Registered Nurse  Primary cervical cancer with metastasis to other site [facility])   Staging form: Cervix Uteri, AJCC 7th Edition     Clinical: No stage assigned - Unsigned    Oncology History   # FEB 2017- STAGE IV ENDOCERVICAL vs endometrial ADENO CA [s/p Inguinal LN Bx; s/p endocervical Bx- pos; p16 pos? endocervical] Lymphadenopathy-RP/Ingiunal LN; CT-PET bil pul nodules [sub-cm]/ MRI- pelvis; March 10th- Carbo-taxol x3 cycles- MAY CT- PROGRESSION  # MAY 2017- TAXOL- TOPOTECAN- AVASTIN; 03/16/2016-CT-Partial Response; SEP 7th 2017- Partial response.   # OCT 30th 2017- Avastin q 3 W [maintenance]  # L-1spinous process lytic lesion [on PET]- not on X-geva. S/p RT [JULy 2017]   # MOLECULAR TESTING: MSI- STABLE     Primary cervical cancer with metastasis to other site [facility])     INTERVAL HISTORY:  A very pleasant 70 year old female patient with above history of endocervical cancer currently on second line therapy with Taxol topotecan Avastin; Currently on maintenance Avastin is here for follow-up.  Patient continues to complain of poor appetite. She has lost 3 pounds since last visit. She was started on Celexa approximately 3 weeks ago. Tolerating it well. Husband does not note significant benefit out of the chair. . Denies any significant pain.  Patient otherwise denies any headaches. Denies any nosebleeds or swelling of the legs.   REVIEW OF SYSTEMS:  A complete 10 point review of system is done which is negative except mentioned above/history of present illness.   PAST MEDICAL HISTORY :  Past Medical History:  Diagnosis Date  . Anxiety   . Carpal tunnel syndrome   . Chemotherapy-induced nausea and vomiting   . Decrease in appetite   . Decreased appetite   . Depression   .  Endometrial cancer ([place]) 11/06/2015  . Hypertension   . Lymphadenopathy 10/25/15  . Nausea & vomiting   . Port-a-cath in place   . Weight loss    30 lb weight loss since October 2016  . Weight loss     PAST SURGICAL HISTORY :   Past Surgical History:  Procedure Laterality Date  . CARPAL TUNNEL RELEASE Left   . FLEXIBLE SIGMOIDOSCOPY  05/2015   performed by Dr. Petra Kuba,  . PERIPHERAL VASCULAR CATHETERIZATION N/A 02/25/2016   Procedure: Glori Luis Cath Insertion;  Surgeon: Katha Cabal, MD;  Location: [place] [facility];  Service: Cardiovascular;  Laterality: N/A;  . SKIN CANCER EXCISION      FAMILY HISTORY :   Family History  Problem Relation Age of Onset  . Breast cancer Sister   . Hypertension Sister   . Hypertension Brother   . Ovarian cancer Paternal Aunt   . Diabetes Maternal Grandmother   . Breast cancer Daughter   . Breast cancer Cousin     maternal    SOCIAL HISTORY:   Social History  Substance Use Topics  . Smoking status: Never Smoker  . Smokeless tobacco: Never Used  . Alcohol use No    ALLERGIES:  is allergic to ibuprofen and tape.  MEDICATIONS:  Current Outpatient Prescriptions  Medication Sig Dispense Refill  . amLODipine (NORVASC) 10 MG tablet Take 1 tablet by mouth daily. Reported on 11/25/2015    . aspirin EC 81 MG tablet Take 81 mg by mouth daily. Reported on 01/31/2016    .  citalopram (CELEXA) 40 MG tablet Take 1 tablet (40 mg total) by mouth daily. 30 tablet 3  . dronabinol (MARINOL) 5 MG capsule Take 1 capsule (5 mg total) by mouth 2 (two) times daily before a meal. 60 capsule 1  . hydrochlorothiazide (HYDRODIURIL) 25 MG tablet Take 1 tablet by mouth daily. Reported on 01/31/2016    . lidocaine-prilocaine (EMLA) cream Apply 1 application topically as needed. To port site 30 g 0  . losartan (COZAAR) 100 MG tablet Take 1 tablet by mouth daily. Reported on 11/25/2015    . HYDROcodone-acetaminophen (NORCO/VICODIN) 5-325 MG tablet Take 1 tablet  by mouth every 6 (six) hours as needed for moderate pain. (Patient not taking: Reported on 07/27/2016) 40 tablet 0  . ondansetron (ZOFRAN) 8 MG tablet Start 3 days after chemo (Patient not taking: Reported on 07/27/2016) 40 tablet 0  . prochlorperazine (COMPAZINE) 10 MG tablet Take 1 tablet (10 mg total) by mouth every 6 (six) hours as needed for nausea or vomiting. (Patient not taking: Reported on 07/27/2016) 40 tablet 0  . promethazine (PHENERGAN) 25 MG tablet Take 1 tablet (25 mg total) by mouth every 6 (six) hours as needed for nausea or vomiting. (Patient not taking: Reported on 07/27/2016) 20 tablet 0   No current facility-administered medications for this visit.    Facility-Administered Medications Ordered in Other Visits  Medication Dose Route Frequency Provider Last Rate Last Dose  . heparin lock flush 100 unit/mL  500 Units Intracatheter Once PRN Cammie Sickle, MD      . sodium chloride flush (NS) 0.9 % injection 10 mL  10 mL Intracatheter PRN Cammie Sickle, MD   10 mL at 07/27/16 1000    PHYSICAL EXAMINATION: ECOG PERFORMANCE STATUS: 1 - Symptomatic but completely ambulatory  BP (!) 143/93 (BP Location: Left Arm, Patient Position: Sitting)   Pulse 66   Temp (!) 96.8 F (36 C) (Tympanic)   Resp 18   Wt 126 lb (57.2 kg)   BMI 19.73 kg/m   Filed Weights   07/27/16 1101  Weight: 126 lb (57.2 kg)    GENERAL:Thin built moderately nourished female patient  Alert, no distress and comfortable.   Accompanied by husband.  EYES: no pallor or icterus OROPHARYNX: no thrush or ulceration; good dentition  NECK: supple, no masses felt LYMPH:  no palpable lymphadenopathy in the cervical, axillary region. Left inguinal region ~1cm felt improved.  LUNGS: clear to auscultation and  No wheeze or crackles HEART/CVS: regular rate & rhythm and no murmurs; No lower extremity edema ABDOMEN:abdomen soft, non-tender and normal bowel sounds Musculoskeletal:no cyanosis of digits and  no clubbing  PSYCH: alert & oriented x 3 with fluent speech NEURO: no focal motor/sensory deficits SKIN:  no rashes or significant lesions  LABORATORY DATA:  I have reviewed the data as listed    Component Value Date/Time   NA 131 (L) 07/27/2016 0919   K 4.1 07/27/2016 0919   CL 102 07/27/2016 0919   CO2 24 07/27/2016 0919   GLUCOSE 108 (H) 07/27/2016 0919   BUN 12 07/27/2016 0919   CREATININE 0.68 07/27/2016 0919   CALCIUM 9.2 07/27/2016 0919   PROT 8.2 (H) 07/27/2016 0919   ALBUMIN 3.1 (L) 07/27/2016 0919   AST 19 07/27/2016 0919   ALT 9 (L) 07/27/2016 0919   ALKPHOS 66 07/27/2016 0919   BILITOT 0.5 07/27/2016 0919   GFRNONAA >60 07/27/2016 0919   GFRAA >60 07/27/2016 0919    No results found for: SPEP,  UPEP  Lab Results  Component Value Date   WBC 3.8 07/27/2016   NEUTROABS 2.9 07/27/2016   HGB 9.6 (L) 07/27/2016   HCT 29.6 (L) 07/27/2016   MCV 76.9 (L) 07/27/2016   PLT 259 07/27/2016      Chemistry      Component Value Date/Time   NA 131 (L) 07/27/2016 0919   K 4.1 07/27/2016 0919   CL 102 07/27/2016 0919   CO2 24 07/27/2016 0919   BUN 12 07/27/2016 0919   CREATININE 0.68 07/27/2016 0919      Component Value Date/Time   CALCIUM 9.2 07/27/2016 0919   ALKPHOS 66 07/27/2016 0919   AST 19 07/27/2016 0919   ALT 9 (L) 07/27/2016 0919   BILITOT 0.5 07/27/2016 0919       RADIOGRAPHIC STUDIES: I have personally reviewed the radiological images as listed and agreed with the findings in the report. No results found.   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   # continue Avastin  q3 W today. Labs are adequate except for mild anemia. Hemoglobin 9. Stable.   # Lumbar pain -on RT; improved. On X-geva q 6 W;-start today again; ca-is okay.   # Depression [poor appetite and chronic mild nausea;  fatigue; does not want to go out]- increase to 40 mg once a day.  # weight loss/ poor apetitie- recommend referral to Dietician.   # follow up with me in 3 weeks/cbc-cmp/UA; Avastin.    Orders Placed This Encounter  Procedures  . CBC with Differential    Standing Status:   Future    Standing Expiration Date:   07/27/2017  . Comprehensive metabolic panel    Standing Status:   Future    Standing Expiration Date:   07/27/2017  . Urinalysis complete, with microscopic [facility])    Standing Status:   Future    Standing Expiration Date:   07/27/2017   All questions were answered. The patient knows to call the clinic with any problems, questions or concerns    Cammie Sickle, MD 07/27/2016 12:59 PM        Cammie Sickle, MD 07/27/2016 12:59 PM

## 2016-07-28 LAB — CA 125: CA 125: 41.4 U/mL — ABNORMAL HIGH (ref 0.0–38.1)

## 2016-08-06 ENCOUNTER — Inpatient Hospital Stay: Payer: Medicare Other

## 2016-08-06 NOTE — Progress Notes (Signed)
Nutrition  Patient did not show for appointment today or call to reschedule.    Jennifer Berry B. [name], [place], [place] (pager)

## 2016-08-17 ENCOUNTER — Inpatient Hospital Stay: Payer: Medicare Other | Attending: Internal Medicine

## 2016-08-17 ENCOUNTER — Inpatient Hospital Stay (HOSPITAL_BASED_OUTPATIENT_CLINIC_OR_DEPARTMENT_OTHER): Payer: Medicare Other | Admitting: Internal Medicine

## 2016-08-17 ENCOUNTER — Inpatient Hospital Stay: Payer: Medicare Other

## 2016-08-17 ENCOUNTER — Other Ambulatory Visit: Payer: Self-pay | Admitting: *Deleted

## 2016-08-17 VITALS — BP 149/77 | HR 103 | Temp 95.6°F | Wt 124.2 lb

## 2016-08-17 DIAGNOSIS — R0602 Shortness of breath: Secondary | ICD-10-CM | POA: Diagnosis not present

## 2016-08-17 DIAGNOSIS — Z8041 Family history of malignant neoplasm of ovary: Secondary | ICD-10-CM | POA: Insufficient documentation

## 2016-08-17 DIAGNOSIS — F419 Anxiety disorder, unspecified: Secondary | ICD-10-CM | POA: Diagnosis not present

## 2016-08-17 DIAGNOSIS — Z79899 Other long term (current) drug therapy: Secondary | ICD-10-CM | POA: Insufficient documentation

## 2016-08-17 DIAGNOSIS — I10 Essential (primary) hypertension: Secondary | ICD-10-CM | POA: Diagnosis not present

## 2016-08-17 DIAGNOSIS — R634 Abnormal weight loss: Secondary | ICD-10-CM | POA: Insufficient documentation

## 2016-08-17 DIAGNOSIS — J00 Acute nasopharyngitis [common cold]: Secondary | ICD-10-CM

## 2016-08-17 DIAGNOSIS — Z803 Family history of malignant neoplasm of breast: Secondary | ICD-10-CM | POA: Insufficient documentation

## 2016-08-17 DIAGNOSIS — Z7982 Long term (current) use of aspirin: Secondary | ICD-10-CM | POA: Diagnosis not present

## 2016-08-17 DIAGNOSIS — Z923 Personal history of irradiation: Secondary | ICD-10-CM | POA: Diagnosis not present

## 2016-08-17 DIAGNOSIS — R5383 Other fatigue: Secondary | ICD-10-CM | POA: Diagnosis not present

## 2016-08-17 DIAGNOSIS — C539 Malignant neoplasm of cervix uteri, unspecified: Secondary | ICD-10-CM

## 2016-08-17 DIAGNOSIS — R630 Anorexia: Secondary | ICD-10-CM

## 2016-08-17 DIAGNOSIS — Z5112 Encounter for antineoplastic immunotherapy: Secondary | ICD-10-CM | POA: Insufficient documentation

## 2016-08-17 DIAGNOSIS — F329 Major depressive disorder, single episode, unspecified: Secondary | ICD-10-CM

## 2016-08-17 DIAGNOSIS — Z85828 Personal history of other malignant neoplasm of skin: Secondary | ICD-10-CM | POA: Insufficient documentation

## 2016-08-17 DIAGNOSIS — C530 Malignant neoplasm of endocervix: Secondary | ICD-10-CM

## 2016-08-17 DIAGNOSIS — R439 Unspecified disturbances of smell and taste: Secondary | ICD-10-CM | POA: Diagnosis not present

## 2016-08-17 DIAGNOSIS — R0609 Other forms of dyspnea: Secondary | ICD-10-CM

## 2016-08-17 DIAGNOSIS — M545 Low back pain: Secondary | ICD-10-CM | POA: Diagnosis not present

## 2016-08-17 DIAGNOSIS — C7951 Secondary malignant neoplasm of bone: Secondary | ICD-10-CM | POA: Insufficient documentation

## 2016-08-17 DIAGNOSIS — R110 Nausea: Secondary | ICD-10-CM

## 2016-08-17 DIAGNOSIS — D649 Anemia, unspecified: Secondary | ICD-10-CM | POA: Diagnosis not present

## 2016-08-17 DIAGNOSIS — C775 Secondary and unspecified malignant neoplasm of intrapelvic lymph nodes: Secondary | ICD-10-CM

## 2016-08-17 DIAGNOSIS — Z9221 Personal history of antineoplastic chemotherapy: Secondary | ICD-10-CM

## 2016-08-17 LAB — URINALYSIS, COMPLETE (UACMP) WITH MICROSCOPIC
Bacteria, UA: NONE SEEN
Bilirubin Urine: NEGATIVE
GLUCOSE, UA: NEGATIVE mg/dL
Hgb urine dipstick: NEGATIVE
KETONES UR: NEGATIVE mg/dL
NITRITE: NEGATIVE
PH: 5 (ref 5.0–8.0)
Protein, ur: 30 mg/dL — AB
SPECIFIC GRAVITY, URINE: 1.018 (ref 1.005–1.030)

## 2016-08-17 LAB — CBC WITH DIFFERENTIAL/PLATELET
Basophils Absolute: 0 10*3/uL (ref 0–0.1)
Basophils Relative: 0 %
EOS PCT: 0 %
Eosinophils Absolute: 0 10*3/uL (ref 0–0.7)
HEMATOCRIT: 30 % — AB (ref 35.0–47.0)
Hemoglobin: 9.6 g/dL — ABNORMAL LOW (ref 12.0–16.0)
LYMPHS ABS: 0.4 10*3/uL — AB (ref 1.0–3.6)
LYMPHS PCT: 10 %
MCH: 23.5 pg — AB (ref 26.0–34.0)
MCHC: 32 g/dL (ref 32.0–36.0)
MCV: 73.6 fL — AB (ref 80.0–100.0)
MONO ABS: 0.4 10*3/uL (ref 0.2–0.9)
MONOS PCT: 9 %
Neutro Abs: 3.6 10*3/uL (ref 1.4–6.5)
Neutrophils Relative %: 81 %
PLATELETS: 292 10*3/uL (ref 150–440)
RBC: 4.08 MIL/uL (ref 3.80–5.20)
RDW: 18.4 % — AB (ref 11.5–14.5)
WBC: 4.5 10*3/uL (ref 3.6–11.0)

## 2016-08-17 LAB — COMPREHENSIVE METABOLIC PANEL
ALT: 10 U/L — ABNORMAL LOW (ref 14–54)
AST: 22 U/L (ref 15–41)
Albumin: 2.9 g/dL — ABNORMAL LOW (ref 3.5–5.0)
Alkaline Phosphatase: 62 U/L (ref 38–126)
Anion gap: 8 (ref 5–15)
BILIRUBIN TOTAL: 0.5 mg/dL (ref 0.3–1.2)
BUN: 12 mg/dL (ref 6–20)
CHLORIDE: 102 mmol/L (ref 101–111)
CO2: 22 mmol/L (ref 22–32)
CREATININE: 0.63 mg/dL (ref 0.44–1.00)
Calcium: 8.4 mg/dL — ABNORMAL LOW (ref 8.9–10.3)
Glucose, Bld: 123 mg/dL — ABNORMAL HIGH (ref 65–99)
POTASSIUM: 4 mmol/L (ref 3.5–5.1)
Sodium: 132 mmol/L — ABNORMAL LOW (ref 135–145)
TOTAL PROTEIN: 8.2 g/dL — AB (ref 6.5–8.1)

## 2016-08-17 MED ORDER — SODIUM CHLORIDE 0.9 % IV SOLN
Freq: Once | INTRAVENOUS | Status: AC
  Administered 2016-08-17: 10:00:00 via INTRAVENOUS
  Filled 2016-08-17: qty 1000

## 2016-08-17 MED ORDER — HEPARIN SOD (PORK) LOCK FLUSH 100 UNIT/ML IV SOLN
500.0000 [IU] | Freq: Once | INTRAVENOUS | Status: AC | PRN
  Administered 2016-08-17: 500 [IU]

## 2016-08-17 MED ORDER — HEPARIN SOD (PORK) LOCK FLUSH 100 UNIT/ML IV SOLN: 500.0000 [IU] | Freq: Once | INTRAVENOUS | Status: DC

## 2016-08-17 MED ORDER — SODIUM CHLORIDE 0.9% FLUSH
10.0000 mL | INTRAVENOUS | Status: DC | PRN
  Administered 2016-08-17: 10 mL
  Filled 2016-08-17: qty 10

## 2016-08-17 MED ORDER — SODIUM CHLORIDE 0.9 % IV SOLN
850.0000 mg | Freq: Once | INTRAVENOUS | Status: AC
  Administered 2016-08-17: 850 mg via INTRAVENOUS
  Filled 2016-08-17: qty 2

## 2016-08-17 MED ORDER — SODIUM CHLORIDE 0.9% FLUSH
10.0000 mL | Freq: Once | INTRAVENOUS | Status: AC
  Administered 2016-08-17: 10 mL via INTRAVENOUS
  Filled 2016-08-17: qty 10

## 2016-08-17 MED ORDER — LORAZEPAM 0.5 MG PO TABS: ORAL_TABLET | ORAL | 0 refills | Status: DC

## 2016-08-17 NOTE — Assessment & Plan Note (Addendum)
Stage IV metastatic endocervical cancer currently on Avastin maintenance #6; SEP 7th  CT- PR. Clinical response noted.  # continue Avastin  q3 W today. Labs are adequate except for mild anemia. Hemoglobin 9. Stable.   # Dyspnea- CXR. ? URI vs others- fatigue/depression.   # Lumbar pain -on RT; improved. On X-geva q 6 W;-start today again; ca-is okay.   # Depression- stable continue Celexa.  # Nausea- ? Anticipatory- recommend Ativan prn.   # weight loss/ poor apetitie- recommend referral to Dietician. Pt reluctant.   # follow up with me in 4 weeks/cbc-cmp/UA;/Xgeva Avastin.; CT scan prior.

## 2016-08-17 NOTE — Progress Notes (Signed)
Avastin 950mg  ordered for this patient. Wt has decreased and calculated dose is 850mg . Called and spoke with Dr B. Dose will be reduced to 850mg .

## 2016-08-17 NOTE — Progress Notes (Signed)
Patient here today for follow up Primary cervical cancer with metastasis to other site.

## 2016-08-17 NOTE — Progress Notes (Signed)
[place] OFFICE PROGRESS NOTE  Patient Care Team: Petra Kuba, MD as PCP - General (Family Medicine) Clent Jacks, RN as Registered Nurse  Primary cervical cancer with metastasis to other site [facility])   Staging form: Cervix Uteri, AJCC 7th Edition     Clinical: No stage assigned - Unsigned    Oncology History   # FEB 2017- STAGE IV ENDOCERVICAL vs endometrial ADENO CA [s/p Inguinal LN Bx; s/p endocervical Bx- pos; p16 pos? endocervical] Lymphadenopathy-RP/Ingiunal LN; CT-PET bil pul nodules [sub-cm]/ MRI- pelvis; March 10th- Carbo-taxol x3 cycles- MAY CT- PROGRESSION  # MAY 2017- TAXOL- TOPOTECAN- AVASTIN; 03/16/2016-CT-Partial Response; SEP 7th 2017- Partial response.   # OCT 30th 2017- Avastin q 3 W [maintenance]  # L-1spinous process lytic lesion [on PET]- not on X-geva. S/p RT [JULy 2017]   # MOLECULAR TESTING: MSI- STABLE     Primary cervical cancer with metastasis to other site [facility])     INTERVAL HISTORY:  A very pleasant 70 year old female patient with above history of endocervical cancer currently on second line therapy with Taxol topotecan Avastin; Currently on maintenance Avastin is here for follow-up.  Patient complains of significant nausea this morning; on the way to [place].. No vomiting.  Patient states to have had "cold"- with nasal stuffiness; runny nose. Continues to complain of poor appetite/poor taste. She continues to be on Celexa. Denies any pain. Patient otherwise denies any headaches. Denies any nosebleeds or swelling of the legs. As per the husband patient had been having difficulty breathing. No cough. Positive for fatigue.  REVIEW OF SYSTEMS:  A complete 10 point review of system is done which is negative except mentioned above/history of present illness.   PAST MEDICAL HISTORY :  Past Medical History:  Diagnosis Date  . Anxiety   . Carpal tunnel syndrome   .  Chemotherapy-induced nausea and vomiting   . Decrease in appetite   . Decreased appetite   . Depression   . Endometrial cancer ([place]) 11/06/2015  . Hypertension   . Lymphadenopathy 10/25/15  . Nausea & vomiting   . Port-a-cath in place   . Weight loss    30 lb weight loss since October 2016  . Weight loss     PAST SURGICAL HISTORY :   Past Surgical History:  Procedure Laterality Date  . CARPAL TUNNEL RELEASE Left   . FLEXIBLE SIGMOIDOSCOPY  05/2015   performed by Dr. Petra Kuba,  . PERIPHERAL VASCULAR CATHETERIZATION N/A 02/25/2016   Procedure: Glori Luis Cath Insertion;  Surgeon: Katha Cabal, MD;  Location: [place] [facility];  Service: Cardiovascular;  Laterality: N/A;  . SKIN CANCER EXCISION      FAMILY HISTORY :   Family History  Problem Relation Age of Onset  . Breast cancer Sister   . Hypertension Sister   . Hypertension Brother   . Ovarian cancer Paternal Aunt   . Diabetes Maternal Grandmother   . Breast cancer Daughter   . Breast cancer Cousin     maternal    SOCIAL HISTORY:   Social History  Substance Use Topics  . Smoking status: Never Smoker  . Smokeless tobacco: Never Used  . Alcohol use No    ALLERGIES:  is allergic to ibuprofen and tape.  MEDICATIONS:  Current Outpatient Prescriptions  Medication Sig Dispense Refill  . amLODipine (NORVASC) 10 MG tablet Take 1 tablet by mouth daily. Reported on 11/25/2015    . aspirin  EC 81 MG tablet Take 81 mg by mouth daily. Reported on 01/31/2016    . citalopram (CELEXA) 40 MG tablet Take 1 tablet (40 mg total) by mouth daily. 30 tablet 3  . hydrochlorothiazide (HYDRODIURIL) 25 MG tablet Take 1 tablet by mouth daily. Reported on 01/31/2016    . HYDROcodone-acetaminophen (NORCO/VICODIN) 5-325 MG tablet Take 1 tablet by mouth every 6 (six) hours as needed for moderate pain. 40 tablet 0  . lidocaine-prilocaine (EMLA) cream Apply 1 application topically as needed. To port site 30 g 0  . losartan (COZAAR) 100 MG  tablet Take 1 tablet by mouth daily. Reported on 11/25/2015    . ondansetron (ZOFRAN) 8 MG tablet Start 3 days after chemo 40 tablet 0  . prochlorperazine (COMPAZINE) 10 MG tablet Take 1 tablet (10 mg total) by mouth every 6 (six) hours as needed for nausea or vomiting. 40 tablet 0  . promethazine (PHENERGAN) 25 MG tablet Take 1 tablet (25 mg total) by mouth every 6 (six) hours as needed for nausea or vomiting. 20 tablet 0  . dronabinol (MARINOL) 5 MG capsule Take 1 capsule (5 mg total) by mouth 2 (two) times daily before a meal. (Patient not taking: Reported on 08/17/2016) 60 capsule 1  . LORazepam (ATIVAN) 0.5 MG tablet One pill as needed for nausea [not more than 2 a day] 30 tablet 0   No current facility-administered medications for this visit.     PHYSICAL EXAMINATION: ECOG PERFORMANCE STATUS: 1 - Symptomatic but completely ambulatory  BP (!) 149/77 (BP Location: Right Arm, Patient Position: Sitting)   Pulse (!) 103   Temp (!) 95.6 F (35.3 C) (Tympanic)   Wt 124 lb 4 oz (56.4 kg)   BMI 19.46 kg/m   Filed Weights   08/17/16 0846  Weight: 124 lb 4 oz (56.4 kg)    GENERAL:Thin built moderately nourished female patient  Alert, no distress and comfortable.   Accompanied by husband.  EYES: no pallor or icterus OROPHARYNX: no thrush or ulceration; good dentition  NECK: supple, no masses felt LYMPH:  no palpable lymphadenopathy in the cervical, axillary region. Left inguinal region -resolved [improved. ]  LUNGS: clear to auscultation and  No wheeze or crackles HEART/CVS: regular rate & rhythm and no murmurs; No lower extremity edema ABDOMEN:abdomen soft, non-tender and normal bowel sounds Musculoskeletal:no cyanosis of digits and no clubbing  PSYCH: alert & oriented x 3 with fluent speech NEURO: no focal motor/sensory deficits SKIN:  no rashes or significant lesions  LABORATORY DATA:  I have reviewed the data as listed    Component Value Date/Time   NA 132 (L) 08/17/2016 0816    K 4.0 08/17/2016 0816   CL 102 08/17/2016 0816   CO2 22 08/17/2016 0816   GLUCOSE 123 (H) 08/17/2016 0816   BUN 12 08/17/2016 0816   CREATININE 0.63 08/17/2016 0816   CALCIUM 8.4 (L) 08/17/2016 0816   PROT 8.2 (H) 08/17/2016 0816   ALBUMIN 2.9 (L) 08/17/2016 0816   AST 22 08/17/2016 0816   ALT 10 (L) 08/17/2016 0816   ALKPHOS 62 08/17/2016 0816   BILITOT 0.5 08/17/2016 0816   GFRNONAA >60 08/17/2016 0816   GFRAA >60 08/17/2016 0816    No results found for: SPEP, UPEP  Lab Results  Component Value Date   WBC 4.5 08/17/2016   NEUTROABS 3.6 08/17/2016   HGB 9.6 (L) 08/17/2016   HCT 30.0 (L) 08/17/2016   MCV 73.6 (L) 08/17/2016   PLT 292 08/17/2016  Chemistry      Component Value Date/Time   NA 132 (L) 08/17/2016 0816   K 4.0 08/17/2016 0816   CL 102 08/17/2016 0816   CO2 22 08/17/2016 0816   BUN 12 08/17/2016 0816   CREATININE 0.63 08/17/2016 0816      Component Value Date/Time   CALCIUM 8.4 (L) 08/17/2016 0816   ALKPHOS 62 08/17/2016 0816   AST 22 08/17/2016 0816   ALT 10 (L) 08/17/2016 0816   BILITOT 0.5 08/17/2016 0816       RADIOGRAPHIC STUDIES: I have personally reviewed the radiological images as listed and agreed with the findings in the report. No results found.   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 Clinical response noted.  # continue Avastin  q3 W today. Labs are adequate except for mild anemia. Hemoglobin 9. Stable.   # Dyspnea- CXR. ? URI vs others- fatigue/depression.   # Lumbar pain -on RT; improved. On X-geva q 6 W;-start today again; ca-is okay.   # Depression- stable continue Celexa.  # Nausea- ? Anticipatory- recommend Ativan prn.   # weight loss/ poor apetitie- recommend referral to Dietician. Pt reluctant.   # follow up with me in 4 weeks/cbc-cmp/UA;/Xgeva Avastin.; CT scan prior.     Orders Placed This Encounter  Procedures  . DG Chest 2 View    Standing Status:   Future    Standing Expiration Date:   10/17/2017    Order Specific Question:   Reason for Exam (SYMPTOM  OR DIAGNOSIS REQUIRED)    Answer:   dyspnea    Order Specific Question:   Preferred imaging location?    Answer:   [facility] PELVIS W CONTRAST    Standing Status:   Future    Standing Expiration Date:   11/16/2017    Order Specific Question:   Reason for Exam (SYMPTOM  OR DIAGNOSIS REQUIRED)    Answer:   endometrial cancer    Order Specific Question:   Preferred imaging location?    Answer:   [facility]  . CT CHEST W CONTRAST    Standing Status:   Future    Standing Expiration Date:   10/17/2017    Order Specific Question:   Reason for Exam (SYMPTOM  OR DIAGNOSIS REQUIRED)    Answer:   endometrial cancer    Order Specific Question:   Preferred imaging location?    [facility]  . CBC with Differential    Standing Status:   Future    Standing Expiration Date:   08/17/2017  . Comprehensive metabolic panel    Standing Status:   Future    Standing Expiration Date:   08/17/2017  . CA 125    Standing Status:   Future    Standing Expiration Date:   08/17/2017  . Urinalysis, Complete w Microscopic    Standing Status:   Future    Standing Expiration Date:   08/17/2017      Cammie Sickle, MD 08/18/2016 7:56 AM        Cammie Sickle, MD 08/18/2016 7:56 AM

## 2016-08-26 NOTE — Progress Notes (Signed)
Erroneous entry

## 2016-09-09 ENCOUNTER — Ambulatory Visit
Admission: RE | Admit: 2016-09-09 | Discharge: 2016-09-09 | Disposition: A | Discharge status: Home / Self Care | Payer: Medicare Other | Source: Ambulatory Visit | Attending: Internal Medicine | Admitting: Internal Medicine

## 2016-09-09 DIAGNOSIS — C539 Malignant neoplasm of cervix uteri, unspecified: Secondary | ICD-10-CM | POA: Insufficient documentation

## 2016-09-09 DIAGNOSIS — N200 Calculus of kidney: Secondary | ICD-10-CM | POA: Diagnosis not present

## 2016-09-09 DIAGNOSIS — I2510 Atherosclerotic heart disease of native coronary artery without angina pectoris: Secondary | ICD-10-CM | POA: Diagnosis not present

## 2016-09-09 DIAGNOSIS — I700 Atherosclerosis of aorta: Secondary | ICD-10-CM | POA: Diagnosis not present

## 2016-09-09 DIAGNOSIS — C799 Secondary malignant neoplasm of unspecified site: Secondary | ICD-10-CM | POA: Diagnosis present

## 2016-09-09 DIAGNOSIS — N281 Cyst of kidney, acquired: Secondary | ICD-10-CM | POA: Insufficient documentation

## 2016-09-09 DIAGNOSIS — K8020 Calculus of gallbladder without cholecystitis without obstruction: Secondary | ICD-10-CM | POA: Diagnosis not present

## 2016-09-09 DIAGNOSIS — M5136 Other intervertebral disc degeneration, lumbar region: Secondary | ICD-10-CM | POA: Diagnosis not present

## 2016-09-09 MED ORDER — IOPAMIDOL (ISOVUE-300) INJECTION 61%
85.0000 mL | Freq: Once | INTRAVENOUS | Status: AC | PRN
  Administered 2016-09-09: 85 mL via INTRAVENOUS

## 2016-09-14 ENCOUNTER — Inpatient Hospital Stay: Payer: Medicare Other | Attending: Internal Medicine | Admitting: Internal Medicine

## 2016-09-14 ENCOUNTER — Inpatient Hospital Stay: Payer: Medicare Other

## 2016-09-14 VITALS — BP 131/86 | HR 99 | Temp 95.4°F | Wt 120.0 lb

## 2016-09-14 DIAGNOSIS — Z7952 Long term (current) use of systemic steroids: Secondary | ICD-10-CM | POA: Diagnosis not present

## 2016-09-14 DIAGNOSIS — Z803 Family history of malignant neoplasm of breast: Secondary | ICD-10-CM | POA: Diagnosis not present

## 2016-09-14 DIAGNOSIS — C7802 Secondary malignant neoplasm of left lung: Secondary | ICD-10-CM | POA: Diagnosis not present

## 2016-09-14 DIAGNOSIS — Z79899 Other long term (current) drug therapy: Secondary | ICD-10-CM | POA: Diagnosis not present

## 2016-09-14 DIAGNOSIS — R630 Anorexia: Secondary | ICD-10-CM | POA: Insufficient documentation

## 2016-09-14 DIAGNOSIS — Z8041 Family history of malignant neoplasm of ovary: Secondary | ICD-10-CM | POA: Diagnosis not present

## 2016-09-14 DIAGNOSIS — C7801 Secondary malignant neoplasm of right lung: Secondary | ICD-10-CM | POA: Insufficient documentation

## 2016-09-14 DIAGNOSIS — F419 Anxiety disorder, unspecified: Secondary | ICD-10-CM

## 2016-09-14 DIAGNOSIS — I10 Essential (primary) hypertension: Secondary | ICD-10-CM | POA: Insufficient documentation

## 2016-09-14 DIAGNOSIS — R591 Generalized enlarged lymph nodes: Secondary | ICD-10-CM | POA: Insufficient documentation

## 2016-09-14 DIAGNOSIS — R634 Abnormal weight loss: Secondary | ICD-10-CM | POA: Insufficient documentation

## 2016-09-14 DIAGNOSIS — R110 Nausea: Secondary | ICD-10-CM | POA: Diagnosis not present

## 2016-09-14 DIAGNOSIS — Z7982 Long term (current) use of aspirin: Secondary | ICD-10-CM | POA: Insufficient documentation

## 2016-09-14 DIAGNOSIS — Z923 Personal history of irradiation: Secondary | ICD-10-CM | POA: Insufficient documentation

## 2016-09-14 DIAGNOSIS — Z7189 Other specified counseling: Secondary | ICD-10-CM

## 2016-09-14 DIAGNOSIS — C7951 Secondary malignant neoplasm of bone: Secondary | ICD-10-CM

## 2016-09-14 DIAGNOSIS — C539 Malignant neoplasm of cervix uteri, unspecified: Secondary | ICD-10-CM

## 2016-09-14 DIAGNOSIS — C530 Malignant neoplasm of endocervix: Secondary | ICD-10-CM | POA: Insufficient documentation

## 2016-09-14 DIAGNOSIS — F329 Major depressive disorder, single episode, unspecified: Secondary | ICD-10-CM | POA: Diagnosis not present

## 2016-09-14 DIAGNOSIS — R0602 Shortness of breath: Secondary | ICD-10-CM | POA: Diagnosis not present

## 2016-09-14 DIAGNOSIS — Z9221 Personal history of antineoplastic chemotherapy: Secondary | ICD-10-CM | POA: Diagnosis not present

## 2016-09-14 LAB — CBC WITH DIFFERENTIAL/PLATELET
Basophils Absolute: 0 10*3/uL (ref 0–0.1)
Basophils Relative: 0 %
EOS ABS: 0 10*3/uL (ref 0–0.7)
Eosinophils Relative: 0 %
HEMATOCRIT: 28.3 % — AB (ref 35.0–47.0)
HEMOGLOBIN: 9.1 g/dL — AB (ref 12.0–16.0)
LYMPHS ABS: 0.5 10*3/uL — AB (ref 1.0–3.6)
LYMPHS PCT: 12 %
MCH: 22.8 pg — AB (ref 26.0–34.0)
MCHC: 32.2 g/dL (ref 32.0–36.0)
MCV: 70.7 fL — AB (ref 80.0–100.0)
MONOS PCT: 11 %
Monocytes Absolute: 0.4 10*3/uL (ref 0.2–0.9)
NEUTROS PCT: 77 %
Neutro Abs: 3.2 10*3/uL (ref 1.4–6.5)
Platelets: 314 10*3/uL (ref 150–440)
RBC: 4 MIL/uL (ref 3.80–5.20)
RDW: 18.4 % — ABNORMAL HIGH (ref 11.5–14.5)
WBC: 4.2 10*3/uL (ref 3.6–11.0)

## 2016-09-14 LAB — COMPREHENSIVE METABOLIC PANEL
ALK PHOS: 58 U/L (ref 38–126)
ALT: 11 U/L — AB (ref 14–54)
ANION GAP: 7 (ref 5–15)
AST: 22 U/L (ref 15–41)
Albumin: 2.8 g/dL — ABNORMAL LOW (ref 3.5–5.0)
BILIRUBIN TOTAL: 0.6 mg/dL (ref 0.3–1.2)
BUN: 10 mg/dL (ref 6–20)
CALCIUM: 8.7 mg/dL — AB (ref 8.9–10.3)
CO2: 22 mmol/L (ref 22–32)
CREATININE: 0.52 mg/dL (ref 0.44–1.00)
Chloride: 101 mmol/L (ref 101–111)
Glucose, Bld: 135 mg/dL — ABNORMAL HIGH (ref 65–99)
Potassium: 4.1 mmol/L (ref 3.5–5.1)
SODIUM: 130 mmol/L — AB (ref 135–145)
TOTAL PROTEIN: 8.2 g/dL — AB (ref 6.5–8.1)

## 2016-09-14 LAB — URINALYSIS, COMPLETE (UACMP) WITH MICROSCOPIC
Bilirubin Urine: NEGATIVE
Glucose, UA: NEGATIVE mg/dL
Hgb urine dipstick: NEGATIVE
Ketones, ur: NEGATIVE mg/dL
NITRITE: NEGATIVE
PROTEIN: 30 mg/dL — AB
SPECIFIC GRAVITY, URINE: 1.02 (ref 1.005–1.030)
pH: 5 (ref 5.0–8.0)

## 2016-09-14 MED ORDER — HEPARIN SOD (PORK) LOCK FLUSH 100 UNIT/ML IV SOLN
INTRAVENOUS | Status: AC
  Filled 2016-09-14: qty 5

## 2016-09-14 MED ORDER — MIRTAZAPINE 30 MG PO TABS: 30.0000 mg | ORAL_TABLET | Freq: Every day | ORAL | 4 refills | Status: DC

## 2016-09-14 MED ORDER — HEPARIN SOD (PORK) LOCK FLUSH 100 UNIT/ML IV SOLN
500.0000 [IU] | Freq: Once | INTRAVENOUS | Status: AC
  Administered 2016-09-14: 500 [IU] via INTRAVENOUS

## 2016-09-14 MED ORDER — PREDNISONE 20 MG PO TABS: 20.0000 mg | ORAL_TABLET | Freq: Every day | ORAL | 0 refills | Status: DC

## 2016-09-14 NOTE — Progress Notes (Signed)
Nutrition Assessment   Reason for Assessment:   MD referral from Dr. Rogue Bussing  ASSESSMENT:  71 year old female with endocervical cancer with metastatic disease currently on chemotherapy.  Past medical history of HTN, depression.    Patient reports no appetite for months, no taste for food. "I can't think of anything I want to eat."  Patient complains of nausea when cooking foods.  Husband is cooking/preparing foods in shop outside of home at this time.  Patient complains of nausea during this visit. Patient reports that she drinks about 1 ensure per day (220 kcals), sips on pepsi throughout the day, eats a bit or 2 of this or that during the day.  Can't sit down and eat a full meal.    Reports that she does not take her nausea medication.  Reports normal BM doesn't go longer than 2 days without having a BM.   Reports that she took marinol in the past but it did not work.     Medications: compazine, ativan, phernergan, remeron to start today, predisone to start today  Labs: reviewed  Anthropometrics:   Height: 67 inches Weight: 120 lb today UBW: 188 lb about 1 year ago BMI: 18 36% weight loss in the last year, significant weight loss   Estimated Energy Needs  Kcals: 1375-1650 kcals/d Protein: 66-83 g/d Fluid: 1.6 L/d  NUTRITION DIAGNOSIS: Inadequate food and beverage intake related to poor appetite, no taste for food as evidenced by eating < or equal to 75% of energy needs for > or equal to 1 month and weight loss of 36% in the last year   MALNUTRITION DIAGNOSIS: Patient meets criteria for severe malnutrition in chronic illness as evidenced by eating < or equal to 75% of energy needs for > or equal to 1 month and weight loss of 36% in the last year.    INTERVENTION:   Discussed importance of taking nausea medication and trying new medication to increase appetite. Discussed importance of small frequent meals, increasing calories and protein. Encouraged switching ensure  to ensure enlive or ensure plus to provide more calories and protein than ensure original.  Coupons give for ensure and samples of boost products given.      MONITORING, EVALUATION, GOAL: patient will consume adequate calories to prevent further weight loss   NEXT VISIT: Jan 22 during infusion  Darneisha Windhorst B. [name], [place], [place] (pager)

## 2016-09-14 NOTE — Progress Notes (Signed)
[place] OFFICE PROGRESS NOTE  Patient Care Team: Petra Kuba, MD as PCP - General (Family Medicine) Clent Jacks, RN as Registered Nurse  Primary cervical cancer with metastasis to other site [facility])   Staging form: Cervix Uteri, AJCC 7th Edition     Clinical: No stage assigned - Unsigned    Oncology History   # FEB 2017- STAGE IV HIGH GRADE SEROUS ENDOCERVICAL vs endometrial ADENO CA [s/p Inguinal LN Bx; s/p endocervical Bx- pos; p16 pos? endocervical] Lymphadenopathy-RP/Ingiunal LN; CT-PET bil pul nodules [sub-cm]/ MRI- pelvis; March 10th- Carbo-taxol x3 cycles- MAY CT- PROGRESSION  # MAY 2017- TAXOL- TOPOTECAN- AVASTIN; 03/16/2016-CT-Partial Response; SEP 7th 2017- Partial response.   # OCT 30th 2017- Avastin q 3 W [maintenance]; JAN 2018-CT [facility]- PROGRESSION  #   # L-1spinous process lytic lesion [on PET]- not on X-geva. S/p RT [JULy 2017]   # MOLECULAR TESTING: MSI- STABLE     Primary cervical cancer with metastasis to other site [facility])     INTERVAL HISTORY:  A very pleasant 71 year old female patient with above history of endocervical cancer currently on second line therapy with Taxol topotecan Avastin; Currently on maintenance Avastin is here for follow-up/ To review the results of the restaging CT scan.  Patient continues to feel poorly. Poor appetite. Intermittent nausea no vomiting. Losing weight. She has not been taking her antidepressant. As per husband patient is short of breath at night.   Denies any pain. Patient otherwise denies any headaches. Denies any nosebleeds or swelling of the legs. Positive for fatigue.  REVIEW OF SYSTEMS:  A complete 10 point review of system is done which is negative except mentioned above/history of present illness.   PAST MEDICAL HISTORY :  Past Medical History:  Diagnosis Date  . Anxiety   . Carpal tunnel syndrome   . Chemotherapy-induced nausea and  vomiting   . Decrease in appetite   . Decreased appetite   . Depression   . Endometrial cancer ([place]) 11/06/2015  . Hypertension   . Lymphadenopathy 10/25/15  . Nausea & vomiting   . Port-a-cath in place   . Weight loss    30 lb weight loss since October 2016  . Weight loss     PAST SURGICAL HISTORY :   Past Surgical History:  Procedure Laterality Date  . CARPAL TUNNEL RELEASE Left   . FLEXIBLE SIGMOIDOSCOPY  05/2015   performed by Dr. Petra Kuba,  . PERIPHERAL VASCULAR CATHETERIZATION N/A 02/25/2016   Procedure: Glori Luis Cath Insertion;  Surgeon: Katha Cabal, MD;  Location: [place] [facility];  Service: Cardiovascular;  Laterality: N/A;  . SKIN CANCER EXCISION      FAMILY HISTORY :   Family History  Problem Relation Age of Onset  . Breast cancer Sister   . Hypertension Sister   . Hypertension Brother   . Ovarian cancer Paternal Aunt   . Diabetes Maternal Grandmother   . Breast cancer Daughter   . Breast cancer Cousin     maternal    SOCIAL HISTORY:   Social History  Substance Use Topics  . Smoking status: Never Smoker  . Smokeless tobacco: Never Used  . Alcohol use No    ALLERGIES:  is allergic to ibuprofen and tape.  MEDICATIONS:  Current Outpatient Prescriptions  Medication Sig Dispense Refill  . amLODipine (NORVASC) 10 MG tablet Take 1 tablet by mouth daily. Reported on 11/25/2015    . aspirin EC  81 MG tablet Take 81 mg by mouth daily. Reported on 01/31/2016    . hydrochlorothiazide (HYDRODIURIL) 25 MG tablet Take 1 tablet by mouth daily. Reported on 01/31/2016    . lidocaine-prilocaine (EMLA) cream Apply 1 application topically as needed. To port site 30 g 0  . losartan (COZAAR) 100 MG tablet Take 1 tablet by mouth daily. Reported on 11/25/2015    . mirtazapine (REMERON) 30 MG tablet Take 1 tablet (30 mg total) by mouth at bedtime. 30 tablet 4  . ondansetron (ZOFRAN) 8 MG tablet Start 3 days after chemo (Patient not taking: Reported on 09/14/2016) 40  tablet 0  . predniSONE (DELTASONE) 20 MG tablet Take 1 tablet (20 mg total) by mouth daily with breakfast. 30 tablet 0  . prochlorperazine (COMPAZINE) 10 MG tablet Take 1 tablet (10 mg total) by mouth every 6 (six) hours as needed for nausea or vomiting. (Patient not taking: Reported on 09/14/2016) 40 tablet 0  . promethazine (PHENERGAN) 25 MG tablet Take 1 tablet (25 mg total) by mouth every 6 (six) hours as needed for nausea or vomiting. (Patient not taking: Reported on 09/14/2016) 20 tablet 0   No current facility-administered medications for this visit.     PHYSICAL EXAMINATION: ECOG PERFORMANCE STATUS: 1 - Symptomatic but completely ambulatory  BP 131/86 (BP Location: Left Arm, Patient Position: Sitting)   Pulse 99   Temp (!) 95.4 F (35.2 C) (Tympanic)   Wt 120 lb (54.4 kg)   BMI 18.79 kg/m   Filed Weights   09/14/16 0938  Weight: 120 lb (54.4 kg)    GENERAL:Thin built moderately nourished female patient  Alert, no distress and comfortable.   Accompanied by husband.  EYES: no pallor or icterus OROPHARYNX: no thrush or ulceration; good dentition  NECK: supple, no masses felt LYMPH:  no palpable lymphadenopathy in the cervical, axillary region.  LUNGS: clear to auscultation and  No wheeze or crackles HEART/CVS: regular rate & rhythm and no murmurs; No lower extremity edema ABDOMEN:abdomen soft, non-tender and normal bowel sounds Musculoskeletal:no cyanosis of digits and no clubbing  PSYCH: alert & oriented x 3 with fluent speech NEURO: no focal motor/sensory deficits SKIN:  no rashes or significant lesions  LABORATORY DATA:  I have reviewed the data as listed    Component Value Date/Time   NA 130 (L) 09/14/2016 0847   K 4.1 09/14/2016 0847   CL 101 09/14/2016 0847   CO2 22 09/14/2016 0847   GLUCOSE 135 (H) 09/14/2016 0847   BUN 10 09/14/2016 0847   CREATININE 0.52 09/14/2016 0847   CALCIUM 8.7 (L) 09/14/2016 0847   PROT 8.2 (H) 09/14/2016 0847   ALBUMIN 2.8 (L)  09/14/2016 0847   AST 22 09/14/2016 0847   ALT 11 (L) 09/14/2016 0847   ALKPHOS 58 09/14/2016 0847   BILITOT 0.6 09/14/2016 0847   GFRNONAA >60 09/14/2016 0847   GFRAA >60 09/14/2016 0847    No results found for: SPEP, UPEP  Lab Results  Component Value Date   WBC 4.2 09/14/2016   NEUTROABS 3.2 09/14/2016   HGB 9.1 (L) 09/14/2016   HCT 28.3 (L) 09/14/2016   MCV 70.7 (L) 09/14/2016   PLT 314 09/14/2016      Chemistry      Component Value Date/Time   NA 130 (L) 09/14/2016 0847   K 4.1 09/14/2016 0847   CL 101 09/14/2016 0847   CO2 22 09/14/2016 0847   BUN 10 09/14/2016 0847   CREATININE 0.52 09/14/2016 0847  Component Value Date/Time   CALCIUM 8.7 (L) 09/14/2016 0847   ALKPHOS 58 09/14/2016 0847   AST 22 09/14/2016 0847   ALT 11 (L) 09/14/2016 0847   BILITOT 0.6 09/14/2016 0847       RADIOGRAPHIC STUDIES: I have personally reviewed the radiological images as listed and agreed with the findings in the report. No results found.   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   # Reviewed/counselled regarding the goals of care- being palliative/treatment are usually indefinite-until progression or side effects. Goal is to maintain quality of life as the disease is incurable.  # Recommend HOLDING Avastin. Recommend gemcitabine day 1, 8 q  21days.   # Depression- recommend Remeron.  # Nausea- ? Anticipatory-continue Ativan prn.   # weight loss/ poor apetitie- recommend referral to Dietician. Pt agrees. Recommend prednisone 20 mg with break fast x30 days.   # follow up in 4 weeks/labs. Gem-    No orders of the defined types were placed in this encounter.     Cammie Sickle, MD 09/14/2016 10:19 AM        Cammie Sickle, MD 09/14/2016 10:19 AM

## 2016-09-14 NOTE — Progress Notes (Addendum)
Discussed with Dr.Secord- recommend ALimta 900mg  q 21 days. Will inform pt/ change chemo from gemcitabine. wil need b12 /folate. And dex 4 mg BIDx 3 days; starting day prior to chemo.   DISCONTINUE ON PATHWAY REGIMEN - [Other Dx]      START OFF PATHWAY REGIMEN - [Other Dx]  Gemcitabine 1,000 mg/m2 D1, 8  q21 Days  [id number]:Gemcitabine 1,000 mg/m2 D1, 8  q21 Days:   A cycle is every 21 days:     Gemcitabine (Gemzar(R)) 1000 mg/m2 in 250 mL NS IV over 30 minutes days 1 and 8 Dose Mod: None  **Always confirm dose/schedule in your pharmacy ordering system**    Intent of Therapy: Non-Curative / Palliative Intent, Discussed with Patient

## 2016-09-14 NOTE — Progress Notes (Signed)
Patient here today for follow up.  Patient c/o no appetite

## 2016-09-14 NOTE — Progress Notes (Signed)
START ON PATHWAY REGIMEN -      

## 2016-09-14 NOTE — Assessment & Plan Note (Addendum)
Stage IV metastatic endocervical cancer currently on Avastin maintenance #6; DEC 28th  CT-PROGRESSION.   # Reviewed/counselled regarding the goals of care- being palliative/treatment are usually indefinite-until progression or side effects. Goal is to maintain quality of life as the disease is incurable.   # Recommend DISCONTINUING Avastin. Recommend gemcitabine day 1, 8 q  21days.   # Depression- recommend Remeron.  # Nausea- ? Anticipatory-continue Ativan prn.   # weight loss/ poor apetitie- recommend referral to Dietician. Pt agrees. Recommend prednisone 20 mg with break fast x30 days.   # follow up in 4 weeks/labs. Gem- day1 next week.

## 2016-09-15 LAB — CA 125: CA 125: 65.6 U/mL — ABNORMAL HIGH (ref 0.0–38.1)

## 2016-09-18 ENCOUNTER — Telehealth: Payer: Self-pay | Admitting: *Deleted

## 2016-09-18 ENCOUNTER — Other Ambulatory Visit: Payer: Self-pay | Admitting: Internal Medicine

## 2016-09-18 DIAGNOSIS — C539 Malignant neoplasm of cervix uteri, unspecified: Secondary | ICD-10-CM

## 2016-09-18 MED ORDER — FOLIC ACID 1 MG PO TABS: 1.0000 mg | ORAL_TABLET | Freq: Every day | ORAL | 6 refills | Status: AC

## 2016-09-18 NOTE — Progress Notes (Signed)
DISCONTINUE OFF PATHWAY REGIMEN - [Other Dx]  Gemcitabine 1,000 mg/m2 D1, 8  q21 Days  [id number]:Gemcitabine 1,000 mg/m2 D1, 8  q21 Days:   A cycle is every 21 days:     Gemcitabine (Gemzar(R)) 1000 mg/m2 in 250 mL NS IV over 30 minutes days 1 and 8 Dose Mod: None  **Always confirm dose/schedule in your pharmacy ordering system**    REASON: Other Reason PRIOR TREATMENT: Gemcitabine 1,000 mg/m2 D1, 8  q21 Days TREATMENT RESPONSE: Unable to Evaluate  START OFF PATHWAY REGIMEN - [Other Dx]  Pemetrexed 500 mg/m2 q21 days  [id number]:Pemetrexed 500 mg/m2 q21 days:   A cycle is every 21 days:     Pemetrexed (Alimta(R)) 500 mg/m2 in 100 mL NS IV over 10 minutes, manufacturer recommends not administering to patients with CrCl < 45 mL/min Dose Mod: None Additional Orders: Note: Patient to receive the following prior to the initiation of therapy: 1) Dexamethasone 4 mg orally twice daily x 6 doses.  First dose 24 hours before chemotherapy. 2) Folic acid >= [id number] mcg orally daily.  First dose at least 5 days prior to the first dose of pemetrexed. 3) Vitamin B12 1,000 mcg intramuscularly every 9 weeks.  First dose at least 5 days prior to the first dose of pemetrexed.  **Always confirm dose/schedule in your pharmacy ordering system**    Intent of Therapy: Non-Curative / Palliative Intent, Discussed with Patient

## 2016-09-18 NOTE — Addendum Note (Signed)
Addended by: Cammie Sickle on: 09/18/2016 03:29 PM   Modules accepted: Orders

## 2016-09-18 NOTE — Telephone Encounter (Signed)
Contacted patient. Explained that md changed treatment plan to almita. She will start this new drug on Monday instead of gemzar. Explained that md spoke with Dr. Theora Gianotti in gyn oncology who also recommend this drug/treatment plan over gemzar. Explained the role of starting folic acid 1 mg and [id number] injections.  She will pick up the folic acid tomorrow. Explained that these vitamins help prevent side effects from the alimita.  She gave verbal understanding and will keep her apts on Monday. msg sent to sch team to change tx plan. Pharmacy in cancer ctr, charge nurse vickie in chemo and prior auth team-brandi to notified of tx chg.

## 2016-09-21 ENCOUNTER — Inpatient Hospital Stay: Payer: Medicare Other

## 2016-09-21 DIAGNOSIS — C530 Malignant neoplasm of endocervix: Secondary | ICD-10-CM | POA: Diagnosis not present

## 2016-09-21 DIAGNOSIS — C539 Malignant neoplasm of cervix uteri, unspecified: Secondary | ICD-10-CM

## 2016-09-21 MED ORDER — SODIUM CHLORIDE 0.9 % IV SOLN
500.0000 mg/m2 | Freq: Once | INTRAVENOUS | Status: AC
  Administered 2016-09-21: 800 mg via INTRAVENOUS
  Filled 2016-09-21: qty 20

## 2016-09-21 MED ORDER — PROCHLORPERAZINE MALEATE 10 MG PO TABS
10.0000 mg | ORAL_TABLET | Freq: Once | ORAL | Status: AC
  Administered 2016-09-21: 10 mg via ORAL
  Filled 2016-09-21: qty 1

## 2016-09-21 MED ORDER — HEPARIN SOD (PORK) LOCK FLUSH 100 UNIT/ML IV SOLN
500.0000 [IU] | Freq: Once | INTRAVENOUS | Status: AC | PRN
  Administered 2016-09-21: 500 [IU]
  Filled 2016-09-21: qty 5

## 2016-09-21 MED ORDER — SODIUM CHLORIDE 0.9 % IV SOLN
Freq: Once | INTRAVENOUS | Status: AC
  Administered 2016-09-21: 10:00:00 via INTRAVENOUS
  Filled 2016-09-21: qty 1000

## 2016-09-21 MED ORDER — CYANOCOBALAMIN 1000 MCG/ML IJ SOLN
1000.0000 ug | Freq: Once | INTRAMUSCULAR | Status: AC
  Administered 2016-09-21: 1000 ug via INTRAMUSCULAR
  Filled 2016-09-21: qty 1

## 2016-09-28 ENCOUNTER — Other Ambulatory Visit: Payer: Medicare Other

## 2016-09-28 ENCOUNTER — Ambulatory Visit: Payer: Medicare Other

## 2016-09-30 ENCOUNTER — Telehealth: Payer: Self-pay | Admitting: Internal Medicine

## 2016-09-30 ENCOUNTER — Encounter: Payer: Self-pay | Admitting: Internal Medicine

## 2016-09-30 NOTE — Telephone Encounter (Signed)
Left message for daughter [phone] to call us back to discuss her prognosis. Dr.B

## 2016-10-01 ENCOUNTER — Encounter: Payer: Self-pay | Admitting: Internal Medicine

## 2016-10-01 ENCOUNTER — Telehealth: Payer: Self-pay | Admitting: *Deleted

## 2016-10-01 NOTE — Telephone Encounter (Addendum)
Contacted patient's daughter per v/o Dr. Rogue Bussing. Treatment plan care discussed with daughter. Explained that the current treatment regimen is palliative in nature. Ultimately, pt's prognosis is very poor. I explained that once reoccurrence occurs, then treatments and close follow up are usually indefinite until the patient progresses or has worsening side effects.  At this time, the ultimate goal for patient is only palliative. If patient progresses or pt is unable to tolerate this  particular treatment, then the benefits vs the cons need to be weighed.  I also discussed the benefits of qualtiy of life and the role of hospice/palliative care services. Daughter states that Dr. Rogue Bussing only gave her mother "two months to live" and pt is "concentrating that on these numbers."  I explained that Dr. Rogue Bussing did not give her mother a definite timeframe. However, the overall prognosis is poor. I explained that even then any numbers that are given for life expectancy numbers are generalized numbers.  This would not necessarily be the time frame for her mother. I explained that these numbers are based on an overall statistical average for patients across the nation with the same diagnosis on expected patient's prognosis with the same diagnosis. Daughter gave verbal understanding of the pt's care and goals of therapy.  She thanked me for returning her phone call on behalf of Dr. Rogue Bussing.

## 2016-10-12 ENCOUNTER — Inpatient Hospital Stay: Payer: Medicare Other

## 2016-10-12 ENCOUNTER — Ambulatory Visit: Payer: Medicare Other

## 2016-10-12 ENCOUNTER — Other Ambulatory Visit: Payer: Medicare Other

## 2016-10-12 ENCOUNTER — Ambulatory Visit: Payer: Medicare Other | Admitting: Internal Medicine

## 2016-10-12 ENCOUNTER — Inpatient Hospital Stay: Payer: Medicare Other | Attending: Internal Medicine | Admitting: Internal Medicine

## 2016-10-12 DIAGNOSIS — D539 Nutritional anemia, unspecified: Secondary | ICD-10-CM

## 2016-10-12 DIAGNOSIS — R634 Abnormal weight loss: Secondary | ICD-10-CM | POA: Insufficient documentation

## 2016-10-12 DIAGNOSIS — I10 Essential (primary) hypertension: Secondary | ICD-10-CM | POA: Insufficient documentation

## 2016-10-12 DIAGNOSIS — Z5111 Encounter for antineoplastic chemotherapy: Secondary | ICD-10-CM | POA: Insufficient documentation

## 2016-10-12 DIAGNOSIS — G629 Polyneuropathy, unspecified: Secondary | ICD-10-CM | POA: Diagnosis not present

## 2016-10-12 DIAGNOSIS — C539 Malignant neoplasm of cervix uteri, unspecified: Secondary | ICD-10-CM

## 2016-10-12 DIAGNOSIS — R110 Nausea: Secondary | ICD-10-CM | POA: Diagnosis not present

## 2016-10-12 DIAGNOSIS — Z7982 Long term (current) use of aspirin: Secondary | ICD-10-CM | POA: Insufficient documentation

## 2016-10-12 DIAGNOSIS — Z7952 Long term (current) use of systemic steroids: Secondary | ICD-10-CM | POA: Diagnosis not present

## 2016-10-12 DIAGNOSIS — F419 Anxiety disorder, unspecified: Secondary | ICD-10-CM | POA: Diagnosis not present

## 2016-10-12 DIAGNOSIS — E538 Deficiency of other specified B group vitamins: Secondary | ICD-10-CM | POA: Insufficient documentation

## 2016-10-12 DIAGNOSIS — Z8041 Family history of malignant neoplasm of ovary: Secondary | ICD-10-CM | POA: Diagnosis not present

## 2016-10-12 DIAGNOSIS — F329 Major depressive disorder, single episode, unspecified: Secondary | ICD-10-CM | POA: Insufficient documentation

## 2016-10-12 DIAGNOSIS — C779 Secondary and unspecified malignant neoplasm of lymph node, unspecified: Secondary | ICD-10-CM | POA: Diagnosis not present

## 2016-10-12 DIAGNOSIS — R630 Anorexia: Secondary | ICD-10-CM | POA: Insufficient documentation

## 2016-10-12 DIAGNOSIS — R591 Generalized enlarged lymph nodes: Secondary | ICD-10-CM | POA: Diagnosis not present

## 2016-10-12 DIAGNOSIS — Z803 Family history of malignant neoplasm of breast: Secondary | ICD-10-CM | POA: Insufficient documentation

## 2016-10-12 LAB — CBC WITH DIFFERENTIAL/PLATELET
BASOS PCT: 0 %
Basophils Absolute: 0 10*3/uL (ref 0–0.1)
EOS ABS: 0 10*3/uL (ref 0–0.7)
EOS PCT: 0 %
HCT: 28.4 % — ABNORMAL LOW (ref 35.0–47.0)
HEMOGLOBIN: 9.2 g/dL — AB (ref 12.0–16.0)
Lymphocytes Relative: 15 %
Lymphs Abs: 0.8 10*3/uL — ABNORMAL LOW (ref 1.0–3.6)
MCH: 23.7 pg — ABNORMAL LOW (ref 26.0–34.0)
MCHC: 32.3 g/dL (ref 32.0–36.0)
MCV: 73.4 fL — ABNORMAL LOW (ref 80.0–100.0)
MONOS PCT: 11 %
Monocytes Absolute: 0.6 10*3/uL (ref 0.2–0.9)
NEUTROS PCT: 74 %
Neutro Abs: 4.2 10*3/uL (ref 1.4–6.5)
PLATELETS: 331 10*3/uL (ref 150–440)
RBC: 3.87 MIL/uL (ref 3.80–5.20)
RDW: 21.7 % — AB (ref 11.5–14.5)
WBC: 5.7 10*3/uL (ref 3.6–11.0)

## 2016-10-12 LAB — COMPREHENSIVE METABOLIC PANEL
ALBUMIN: 2.7 g/dL — AB (ref 3.5–5.0)
ALK PHOS: 57 U/L (ref 38–126)
ALT: 15 U/L (ref 14–54)
ANION GAP: 6 (ref 5–15)
AST: 19 U/L (ref 15–41)
BUN: 12 mg/dL (ref 6–20)
CHLORIDE: 102 mmol/L (ref 101–111)
CO2: 24 mmol/L (ref 22–32)
Calcium: 8.6 mg/dL — ABNORMAL LOW (ref 8.9–10.3)
Creatinine, Ser: 0.61 mg/dL (ref 0.44–1.00)
GFR calc non Af Amer: >60 mL/min (ref >60)
Glucose, Bld: 134 mg/dL — ABNORMAL HIGH (ref 65–99)
POTASSIUM: 3.6 mmol/L (ref 3.5–5.1)
SODIUM: 132 mmol/L — AB (ref 135–145)
Total Bilirubin: 0.5 mg/dL (ref 0.3–1.2)
Total Protein: 7.6 g/dL (ref 6.5–8.1)

## 2016-10-12 MED ORDER — PREDNISONE 20 MG PO TABS: ORAL_TABLET | ORAL | 0 refills | Status: DC

## 2016-10-12 MED ORDER — PROCHLORPERAZINE MALEATE 10 MG PO TABS
10.0000 mg | ORAL_TABLET | Freq: Once | ORAL | Status: AC
Start: 2016-10-12 — End: 2016-10-12
  Administered 2016-10-12: 10 mg via ORAL
  Filled 2016-10-12: qty 1

## 2016-10-12 MED ORDER — SODIUM CHLORIDE 0.9 % IV SOLN
Freq: Once | INTRAVENOUS | Status: AC
  Administered 2016-10-12: 10:00:00 via INTRAVENOUS
  Filled 2016-10-12: qty 1000

## 2016-10-12 MED ORDER — HEPARIN SOD (PORK) LOCK FLUSH 100 UNIT/ML IV SOLN
500.0000 [IU] | Freq: Once | INTRAVENOUS | Status: AC | PRN
  Administered 2016-10-12: 500 [IU]
  Filled 2016-10-12: qty 5

## 2016-10-12 MED ORDER — CYANOCOBALAMIN 1000 MCG/ML IJ SOLN
1000.0000 ug | Freq: Once | INTRAMUSCULAR | Status: AC
Start: 2016-10-12 — End: 2016-10-12
  Administered 2016-10-12: 1000 ug via INTRAMUSCULAR
  Filled 2016-10-12: qty 1

## 2016-10-12 MED ORDER — SODIUM CHLORIDE 0.9 % IV SOLN
500.0000 mg/m2 | Freq: Once | INTRAVENOUS | Status: AC
  Administered 2016-10-12: 800 mg via INTRAVENOUS
  Filled 2016-10-12: qty 20

## 2016-10-12 NOTE — Progress Notes (Signed)
[place] OFFICE PROGRESS NOTE  Patient Care Team: Petra Kuba, MD as PCP - General (Family Medicine) Clent Jacks, RN as Registered Nurse  Primary cervical cancer with metastasis to other site [facility])   Staging form: Cervix Uteri, AJCC 7th Edition     Clinical: No stage assigned - Unsigned    Oncology History   # FEB 2017- STAGE IV HIGH GRADE SEROUS ENDOCERVICAL vs endometrial ADENO CA [s/p Inguinal LN Bx; s/p endocervical Bx- pos; p16 pos? endocervical] Lymphadenopathy-RP/Ingiunal LN; CT-PET bil pul nodules [sub-cm]/ MRI- pelvis; March 10th- Carbo-taxol x3 cycles- MAY CT- PROGRESSION  # MAY 2017- TAXOL- TOPOTECAN- AVASTIN; 03/16/2016-CT-Partial Response; SEP 7th 2017- Partial response.   # OCT 30th 2017- Avastin q 3 W [maintenance]; DEC 28t 2017-CT CAP- PROGRESSION  # JAN 2018-Alimat every 3 w  # L-1spinous process lytic lesion [on PET]- not on X-geva. S/p RT [JULy 2017]   # MOLECULAR TESTING: MSI- STABLE     Primary cervical cancer with metastasis to other site [facility])     INTERVAL HISTORY:  A very pleasant 71 year old female patient with above history of endocervical cancer currently on  Alimta every 3 weeks status post cycle 1 is here for follow-up.  Patient states her appetite is good. She feels that she has more energy. She is gaining weight. Denies any nausea vomiting. As per the husband patient has had felt this could in a longtime.  Denies any pain. Denies any skin rash. Denies any swelling in the legs.  REVIEW OF SYSTEMS:  A complete 10 point review of system is done which is negative except mentioned above/history of present illness.   PAST MEDICAL HISTORY :  Past Medical History:  Diagnosis Date  . Anxiety   . Carpal tunnel syndrome   . Chemotherapy-induced nausea and vomiting   . Decrease in appetite   . Decreased appetite   . Depression   . Endometrial cancer ([place]) 11/06/2015  .  Hypertension   . Lymphadenopathy 10/25/15  . Nausea & vomiting   . Port-a-cath in place   . Weight loss    30 lb weight loss since October 2016  . Weight loss     PAST SURGICAL HISTORY :   Past Surgical History:  Procedure Laterality Date  . CARPAL TUNNEL RELEASE Left   . FLEXIBLE SIGMOIDOSCOPY  05/2015   performed by Dr. Petra Kuba,  . PERIPHERAL VASCULAR CATHETERIZATION N/A 02/25/2016   Procedure: Glori Luis Cath Insertion;  Surgeon: Katha Cabal, MD;  Location: [place] CV LAB;  Service: Cardiovascular;  Laterality: N/A;  . SKIN CANCER EXCISION      FAMILY HISTORY :   Family History  Problem Relation Age of Onset  . Breast cancer Sister   . Hypertension Sister   . Hypertension Brother   . Ovarian cancer Paternal Aunt   . Diabetes Maternal Grandmother   . Breast cancer Daughter   . Breast cancer Cousin     maternal    SOCIAL HISTORY:   Social History  Substance Use Topics  . Smoking status: Never Smoker  . Smokeless tobacco: Never Used  . Alcohol use No    ALLERGIES:  is allergic to ibuprofen and tape.  MEDICATIONS:  Current Outpatient Prescriptions  Medication Sig Dispense Refill  . amLODipine (NORVASC) 10 MG tablet Take 1 tablet by mouth daily. Reported on 11/25/2015    . aspirin EC 81 MG tablet Take 81 mg by mouth daily.  Reported on 0/71/2197    . folic acid (FOLVITE) 1 MG tablet Take 1 tablet (1 mg total) by mouth daily. 30 tablet 6  . hydrochlorothiazide (HYDRODIURIL) 25 MG tablet Take 1 tablet by mouth daily. Reported on 01/31/2016    . lidocaine-prilocaine (EMLA) cream Apply 1 application topically as needed. To port site 30 g 0  . losartan (COZAAR) 100 MG tablet Take 1 tablet by mouth daily. Reported on 11/25/2015    . predniSONE (DELTASONE) 20 MG tablet Take 1/2 pill every day with break fast. 30 tablet 0  . ondansetron (ZOFRAN) 8 MG tablet Start 3 days after chemo (Patient not taking: Reported on 09/14/2016) 40 tablet 0  . prochlorperazine  (COMPAZINE) 10 MG tablet Take 1 tablet (10 mg total) by mouth every 6 (six) hours as needed for nausea or vomiting. (Patient not taking: Reported on 09/14/2016) 40 tablet 0  . promethazine (PHENERGAN) 25 MG tablet Take 1 tablet (25 mg total) by mouth every 6 (six) hours as needed for nausea or vomiting. (Patient not taking: Reported on 09/14/2016) 20 tablet 0   No current facility-administered medications for this visit.     PHYSICAL EXAMINATION: ECOG PERFORMANCE STATUS: 1 - Symptomatic but completely ambulatory  BP (!) 181/94 (BP Location: Right Arm, Patient Position: Sitting) Comment: recheck  Pulse 73   Temp 98.6 F (37 C) (Tympanic)   Resp 20   Ht 5' 7"  (1.702 m)   Wt 125 lb (56.7 kg)   BMI 19.58 kg/m   Filed Weights   10/12/16 0917  Weight: 125 lb (56.7 kg)    GENERAL:Thin built moderately nourished female patient  Alert, no distress and comfortable.   Accompanied by husband.  EYES: no pallor or icterus OROPHARYNX: no thrush or ulceration; good dentition  NECK: supple, no masses felt LYMPH:  no palpable lymphadenopathy in the cervical, axillary region.  LUNGS: clear to auscultation and  No wheeze or crackles HEART/CVS: regular rate & rhythm and no murmurs; No lower extremity edema ABDOMEN:abdomen soft, non-tender and normal bowel sounds Musculoskeletal:no cyanosis of digits and no clubbing  PSYCH: alert & oriented x 3 with fluent speech NEURO: no focal motor/sensory deficits SKIN:  no rashes or significant lesions  LABORATORY DATA:  I have reviewed the data as listed    Component Value Date/Time   NA 132 (L) 10/12/2016 0844   K 3.6 10/12/2016 0844   CL 102 10/12/2016 0844   CO2 24 10/12/2016 0844   GLUCOSE 134 (H) 10/12/2016 0844   BUN 12 10/12/2016 0844   CREATININE 0.61 10/12/2016 0844   CALCIUM 8.6 (L) 10/12/2016 0844   PROT 7.6 10/12/2016 0844   ALBUMIN 2.7 (L) 10/12/2016 0844   AST 19 10/12/2016 0844   ALT 15 10/12/2016 0844   ALKPHOS 57 10/12/2016 0844    BILITOT 0.5 10/12/2016 0844   GFRNONAA >60 10/12/2016 0844   GFRAA >60 10/12/2016 0844    No results found for: SPEP, UPEP  Lab Results  Component Value Date   WBC 5.7 10/12/2016   NEUTROABS 4.2 10/12/2016   HGB 9.2 (L) 10/12/2016   HCT 28.4 (L) 10/12/2016   MCV 73.4 (L) 10/12/2016   PLT 331 10/12/2016      Chemistry      Component Value Date/Time   NA 132 (L) 10/12/2016 0844   K 3.6 10/12/2016 0844   CL 102 10/12/2016 0844   CO2 24 10/12/2016 0844   BUN 12 10/12/2016 0844   CREATININE 0.61 10/12/2016 0844  Component Value Date/Time   CALCIUM 8.6 (L) 10/12/2016 0844   ALKPHOS 57 10/12/2016 0844   AST 19 10/12/2016 0844   ALT 15 10/12/2016 0844   BILITOT 0.5 10/12/2016 0844       RADIOGRAPHIC STUDIES: I have personally reviewed the radiological images as listed and agreed with the findings in the report. No results found.   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 Currently on alimta s/p cycle #1. No clinical progression noted.  # proceed with cycle #2. Labs- okay.   # Depression- started on Remeron; improved.   # Nausea- ? Anticipatory-continue Ativan prn. Improved.   # weight loss/ poor apetitie- improved.  Decrease to prednisone 10 mg with break fast.   # Peripheral neuropathy- G-1. Monitor for now.   # follow up in 3 weeks/ lab/chemo. Will order CT at next visit.    No orders of the defined types were placed in this encounter.     Cammie Sickle, MD 10/13/2016 8:44 AM        Cammie Sickle, MD 10/13/2016 8:44 AM

## 2016-10-12 NOTE — Assessment & Plan Note (Addendum)
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 Currently on alimta s/p cycle #1. No clinical progression noted.  # proceed with cycle #2. Labs- okay.   # Depression- started on Remeron; improved.   # Nausea- ? Anticipatory-continue Ativan prn. Improved.   # weight loss/ poor apetitie- improved.  Decrease to prednisone 10 mg with break fast.   # Peripheral neuropathy- G-1. Monitor for now.   # follow up in 3 weeks/ lab/chemo. Will order CT at next visit.

## 2016-10-12 NOTE — Progress Notes (Signed)
Nutrition Follow-up:  Nutrition follow-up completed in infusion this am.  Patient with endocervial cancer with metastatic disease.  Patient reports that she feels better and is eating better.  "I have been doing what you told me to do, eat every 2-3 hours." Reports that she typically eats a biscuit for breakfast (egg and sausage or sausage) and drinks orange juice.  Usually does not eat lunch but eats higher calorie and protein snacks (chicken salad and crackers, ice cream, chocolate milk.  Reports for supper eats meat and vegetables (last night cube steak, mashed potatoes and corn).  Reports drinks ensure but not everyday.  No reported nausea or vomiting.  Medications: prednisone  Labs: Na 132, glucose 134  Anthropometrics:   Patient's weight has increased today to 125 pounds, on last visit on 1/8 weight was 120 pounds    NUTRITION DIAGNOSIS: Inadequate food and beverage intake improving   MALNUTRITION DIAGNOSIS: Severe malnutrition improving    INTERVENTION:   Encouraged patient to continue to eat high calorie, high protein snacks and eat small frequent meals. Encouraged patient to consume ensure/boost with at least 350 calories or more. Gave samples and coupons.      MONITORING, EVALUATION, GOAL: Patient will consume adequate calories to prevent further weight loss.   NEXT VISIT: Feb 26 during infusion  Robyne Matar B. [name], [place], [place] (pager)

## 2016-11-02 ENCOUNTER — Other Ambulatory Visit: Payer: Self-pay | Admitting: *Deleted

## 2016-11-02 ENCOUNTER — Inpatient Hospital Stay: Payer: Medicare Other

## 2016-11-02 ENCOUNTER — Inpatient Hospital Stay (HOSPITAL_BASED_OUTPATIENT_CLINIC_OR_DEPARTMENT_OTHER): Payer: Medicare Other | Admitting: Internal Medicine

## 2016-11-02 VITALS — BP 166/89 | HR 97 | Temp 97.8°F | Resp 18 | Ht 67.0 in | Wt 120.0 lb

## 2016-11-02 DIAGNOSIS — R630 Anorexia: Secondary | ICD-10-CM

## 2016-11-02 DIAGNOSIS — G629 Polyneuropathy, unspecified: Secondary | ICD-10-CM | POA: Diagnosis not present

## 2016-11-02 DIAGNOSIS — C189 Malignant neoplasm of colon, unspecified: Secondary | ICD-10-CM

## 2016-11-02 DIAGNOSIS — E538 Deficiency of other specified B group vitamins: Secondary | ICD-10-CM

## 2016-11-02 DIAGNOSIS — F419 Anxiety disorder, unspecified: Secondary | ICD-10-CM

## 2016-11-02 DIAGNOSIS — Z7982 Long term (current) use of aspirin: Secondary | ICD-10-CM

## 2016-11-02 DIAGNOSIS — C539 Malignant neoplasm of cervix uteri, unspecified: Secondary | ICD-10-CM

## 2016-11-02 DIAGNOSIS — R110 Nausea: Secondary | ICD-10-CM

## 2016-11-02 DIAGNOSIS — Z5111 Encounter for antineoplastic chemotherapy: Secondary | ICD-10-CM | POA: Diagnosis not present

## 2016-11-02 DIAGNOSIS — F329 Major depressive disorder, single episode, unspecified: Secondary | ICD-10-CM

## 2016-11-02 DIAGNOSIS — R634 Abnormal weight loss: Secondary | ICD-10-CM | POA: Diagnosis not present

## 2016-11-02 DIAGNOSIS — Z803 Family history of malignant neoplasm of breast: Secondary | ICD-10-CM

## 2016-11-02 DIAGNOSIS — Z8041 Family history of malignant neoplasm of ovary: Secondary | ICD-10-CM

## 2016-11-02 DIAGNOSIS — I10 Essential (primary) hypertension: Secondary | ICD-10-CM

## 2016-11-02 DIAGNOSIS — R591 Generalized enlarged lymph nodes: Secondary | ICD-10-CM | POA: Diagnosis not present

## 2016-11-02 DIAGNOSIS — C779 Secondary and unspecified malignant neoplasm of lymph node, unspecified: Secondary | ICD-10-CM | POA: Diagnosis not present

## 2016-11-02 DIAGNOSIS — Z7952 Long term (current) use of systemic steroids: Secondary | ICD-10-CM

## 2016-11-02 DIAGNOSIS — R590 Localized enlarged lymph nodes: Secondary | ICD-10-CM

## 2016-11-02 DIAGNOSIS — R1900 Intra-abdominal and pelvic swelling, mass and lump, unspecified site: Secondary | ICD-10-CM

## 2016-11-02 LAB — CBC WITH DIFFERENTIAL/PLATELET
BASOS ABS: 0 10*3/uL (ref 0–0.1)
BASOS PCT: 0 %
EOS PCT: 0 %
Eosinophils Absolute: 0 10*3/uL (ref 0–0.7)
HCT: 25.7 % — ABNORMAL LOW (ref 35.0–47.0)
Hemoglobin: 8.4 g/dL — ABNORMAL LOW (ref 12.0–16.0)
Lymphocytes Relative: 9 %
Lymphs Abs: 0.5 10*3/uL — ABNORMAL LOW (ref 1.0–3.6)
MCH: 24.9 pg — ABNORMAL LOW (ref 26.0–34.0)
MCHC: 32.9 g/dL (ref 32.0–36.0)
MCV: 75.6 fL — AB (ref 80.0–100.0)
MONO ABS: 0.7 10*3/uL (ref 0.2–0.9)
MONOS PCT: 14 %
Neutro Abs: 3.8 10*3/uL (ref 1.4–6.5)
Neutrophils Relative %: 77 %
PLATELETS: 348 10*3/uL (ref 150–440)
RBC: 3.4 MIL/uL — ABNORMAL LOW (ref 3.80–5.20)
RDW: 22.3 % — AB (ref 11.5–14.5)
WBC: 5 10*3/uL (ref 3.6–11.0)

## 2016-11-02 LAB — COMPREHENSIVE METABOLIC PANEL
ALBUMIN: 2.5 g/dL — AB (ref 3.5–5.0)
ALK PHOS: 65 U/L (ref 38–126)
ALT: 11 U/L — AB (ref 14–54)
AST: 21 U/L (ref 15–41)
Anion gap: 6 (ref 5–15)
BILIRUBIN TOTAL: 0.4 mg/dL (ref 0.3–1.2)
BUN: 11 mg/dL (ref 6–20)
CALCIUM: 8.4 mg/dL — AB (ref 8.9–10.3)
CO2: 23 mmol/L (ref 22–32)
Chloride: 102 mmol/L (ref 101–111)
Creatinine, Ser: 0.65 mg/dL (ref 0.44–1.00)
GFR calc Af Amer: >60 mL/min (ref >60)
GFR calc non Af Amer: >60 mL/min (ref >60)
GLUCOSE: 142 mg/dL — AB (ref 65–99)
Potassium: 3.7 mmol/L (ref 3.5–5.1)
Sodium: 131 mmol/L — ABNORMAL LOW (ref 135–145)
TOTAL PROTEIN: 7.8 g/dL (ref 6.5–8.1)

## 2016-11-02 MED ORDER — CYANOCOBALAMIN 1000 MCG/ML IJ SOLN
1000.0000 ug | Freq: Once | INTRAMUSCULAR | Status: AC
  Administered 2016-11-02: 1000 ug via INTRAMUSCULAR
  Filled 2016-11-02: qty 1

## 2016-11-02 MED ORDER — HEPARIN SOD (PORK) LOCK FLUSH 100 UNIT/ML IV SOLN
500.0000 [IU] | Freq: Once | INTRAVENOUS | Status: AC | PRN
  Administered 2016-11-02: 500 [IU]
  Filled 2016-11-02: qty 5

## 2016-11-02 MED ORDER — LORAZEPAM 2 MG/ML IJ SOLN
0.5000 mg | Freq: Once | INTRAMUSCULAR | Status: AC
  Administered 2016-11-02: 0.5 mg via INTRAVENOUS
  Filled 2016-11-02: qty 1

## 2016-11-02 MED ORDER — SODIUM CHLORIDE 0.9 % IV SOLN
Freq: Once | INTRAVENOUS | Status: AC
  Administered 2016-11-02: 11:00:00 via INTRAVENOUS
  Filled 2016-11-02: qty 1000

## 2016-11-02 MED ORDER — SODIUM CHLORIDE 0.9 % IV SOLN
500.0000 mg/m2 | Freq: Once | INTRAVENOUS | Status: AC
  Administered 2016-11-02: 800 mg via INTRAVENOUS
  Filled 2016-11-02: qty 20

## 2016-11-02 MED ORDER — PROCHLORPERAZINE EDISYLATE 5 MG/ML IJ SOLN
10.0000 mg | Freq: Once | INTRAMUSCULAR | Status: AC
  Administered 2016-11-02: 10 mg via INTRAVENOUS
  Filled 2016-11-02: qty 2

## 2016-11-02 MED ORDER — SODIUM CHLORIDE 0.9% FLUSH
10.0000 mL | INTRAVENOUS | Status: DC | PRN
  Administered 2016-11-02: 10 mL
  Filled 2016-11-02: qty 10

## 2016-11-02 NOTE — Progress Notes (Signed)
Pt c/o nausea w/o vomiting. Changes in perception of taste/smells. Unable to tolerate greasy foods. Patient continues to loose weight. She states that she is able to tolerate bits of some fruits.  Pt states that she is only taking compazine once or twice a day to prevent nausea. She states that "compazine makes my nausea worse." pt has phenergan and oral zofran tablets at home per husband. Pt has not used these medications- "forgets that I have them." encouraged pt to try oral zofran or phenergan 1 tablet every 6 to 8 hrs for nausea, pt states c/o motion sickness. rn explored h/o nausea. Pt reports a clinical h/o hyperemesis gravidarum during her h/o pregnancy.  Educated pt that these factors could put pt at risk for potentially developing more s/s of chemo induced nausea.  She and her husband gave verbal understanding. She states that she will start taking zofran every 6 to 8 hrs

## 2016-11-02 NOTE — Progress Notes (Signed)
[place] OFFICE PROGRESS NOTE  Patient Care Team: Petra Kuba, MD as PCP - General (Family Medicine) Clent Jacks, RN as Registered Nurse  Primary cervical cancer with metastasis to other site [facility])   Staging form: Cervix Uteri, AJCC 7th Edition     Clinical: No stage assigned - Unsigned    Oncology History   # FEB 2017- STAGE IV HIGH GRADE SEROUS ENDOCERVICAL vs endometrial ADENO CA [s/p Inguinal LN Bx; s/p endocervical Bx- pos; p16 pos? endocervical] Lymphadenopathy-RP/Ingiunal LN; CT-PET bil pul nodules [sub-cm]/ MRI- pelvis; March 10th- Carbo-taxol x3 cycles- MAY CT- PROGRESSION  # MAY 2017- TAXOL- TOPOTECAN- AVASTIN; 03/16/2016-CT-Partial Response; SEP 7th 2017- Partial response.   # OCT 30th 2017- Avastin q 3 W [maintenance]; DEC 28t 2017-CT CAP- PROGRESSION  # JAN 2018-Alimat every 3 w  # L-1spinous process lytic lesion [on PET]- not on X-geva. S/p RT [JULy 2017]   # MOLECULAR TESTING: MSI- STABLE     Primary cervical cancer with metastasis to other site [facility])     INTERVAL HISTORY:  A very pleasant 71 year old female patient with above history of endocervical cancer currently on  Alimta every 3 weeks status post cycle 2 is here for follow-up.  Patient complains of appetite getting worse. She is again lost some weight. Complains of nausea without vomiting. She also completes of nausea/dizziness after a car ride. Patient was tapered to prednisone 10 mg approximately 3 weeks ago.  Denies any pain. Denies any skin rash. Denies any swelling in the legs.  REVIEW OF SYSTEMS:  A complete 10 point review of system is done which is negative except mentioned above/history of present illness.   PAST MEDICAL HISTORY :  Past Medical History:  Diagnosis Date  . Anxiety   . Carpal tunnel syndrome   . Chemotherapy-induced nausea and vomiting   . Decrease in appetite   . Decreased appetite   .  Depression   . Endometrial cancer ([place]) 11/06/2015  . Hypertension   . Lymphadenopathy 10/25/15  . Nausea & vomiting   . Port-a-cath in place   . Weight loss    30 lb weight loss since October 2016  . Weight loss     PAST SURGICAL HISTORY :   Past Surgical History:  Procedure Laterality Date  . CARPAL TUNNEL RELEASE Left   . FLEXIBLE SIGMOIDOSCOPY  05/2015   performed by Dr. Petra Kuba,  . PERIPHERAL VASCULAR CATHETERIZATION N/A 02/25/2016   Procedure: Glori Luis Cath Insertion;  Surgeon: Katha Cabal, MD;  Location: [place] CV LAB;  Service: Cardiovascular;  Laterality: N/A;  . SKIN CANCER EXCISION      FAMILY HISTORY :   Family History  Problem Relation Age of Onset  . Breast cancer Sister   . Hypertension Sister   . Hypertension Brother   . Ovarian cancer Paternal Aunt   . Diabetes Maternal Grandmother   . Breast cancer Daughter   . Breast cancer Cousin     maternal    SOCIAL HISTORY:   Social History  Substance Use Topics  . Smoking status: Never Smoker  . Smokeless tobacco: Never Used  . Alcohol use No    ALLERGIES:  is allergic to ibuprofen and tape.  MEDICATIONS:  Current Outpatient Prescriptions  Medication Sig Dispense Refill  . amLODipine (NORVASC) 10 MG tablet Take 1 tablet by mouth daily. Reported on 11/25/2015    . aspirin EC 81 MG tablet Take 81 mg  by mouth daily. Reported on 04/16/1750    . folic acid (FOLVITE) 1 MG tablet Take 1 tablet (1 mg total) by mouth daily. 30 tablet 6  . hydrochlorothiazide (HYDRODIURIL) 25 MG tablet Take 1 tablet by mouth daily. Reported on 01/31/2016    . lidocaine-prilocaine (EMLA) cream Apply 1 application topically as needed. To port site 30 g 0  . losartan (COZAAR) 100 MG tablet Take 1 tablet by mouth daily. Reported on 11/25/2015    . predniSONE (DELTASONE) 20 MG tablet Take 1/2 pill every day with break fast. 30 tablet 0  . prochlorperazine (COMPAZINE) 10 MG tablet Take 1 tablet (10 mg total) by mouth every 6  (six) hours as needed for nausea or vomiting. 40 tablet 0  . ondansetron (ZOFRAN) 8 MG tablet Start 3 days after chemo (Patient not taking: Reported on 09/14/2016) 40 tablet 0  . promethazine (PHENERGAN) 25 MG tablet Take 1 tablet (25 mg total) by mouth every 6 (six) hours as needed for nausea or vomiting. (Patient not taking: Reported on 09/14/2016) 20 tablet 0   No current facility-administered medications for this visit.    Facility-Administered Medications Ordered in Other Visits  Medication Dose Route Frequency Provider Last Rate Last Dose  . sodium chloride flush (NS) 0.9 % injection 10 mL  10 mL Intracatheter PRN Cammie Sickle, MD   10 mL at 11/02/16 1000    PHYSICAL EXAMINATION: ECOG PERFORMANCE STATUS: 1 - Symptomatic but completely ambulatory  BP (!) 166/89 (Patient Position: Sitting)   Pulse 97   Temp 97.8 F (36.6 C) (Tympanic)   Resp 18   Ht _0  (1.702 m)   Wt 120 lb (54.4 kg)   BMI 18.79 kg/m   Filed Weights   11/02/16 1002  Weight: 120 lb (54.4 kg)    GENERAL:Thin built moderately nourished female patient  Alert, no distress and comfortable.   Accompanied by husband.  EYES: no pallor or icterus OROPHARYNX: no thrush or ulceration; good dentition  NECK: supple, no masses felt LYMPH:  no palpable lymphadenopathy in the cervical, axillary region. 1 cm lymph node felt in the right inguinal region.  LUNGS: clear to auscultation and  No wheeze or crackles HEART/CVS: regular rate & rhythm and no murmurs; No lower extremity edema ABDOMEN:abdomen soft, non-tender and normal bowel sounds Musculoskeletal:no cyanosis of digits and no clubbing  PSYCH: alert & oriented x 3 with fluent speech NEURO: no focal motor/sensory deficits SKIN:  no rashes or significant lesions  LABORATORY DATA:  I have reviewed the data as listed    Component Value Date/Time   NA 131 (L) 11/02/2016 0900   K 3.7 11/02/2016 0900   CL 102 11/02/2016 0900   CO2 23 11/02/2016 0900   GLUCOSE  142 (H) 11/02/2016 0900   BUN 11 11/02/2016 0900   CREATININE 0.65 11/02/2016 0900   CALCIUM 8.4 (L) 11/02/2016 0900   PROT 7.8 11/02/2016 0900   ALBUMIN 2.5 (L) 11/02/2016 0900   AST 21 11/02/2016 0900   ALT 11 (L) 11/02/2016 0900   ALKPHOS 65 11/02/2016 0900   BILITOT 0.4 11/02/2016 0900   GFRNONAA >60 11/02/2016 0900   GFRAA >60 11/02/2016 0900    No results found for: SPEP, UPEP  Lab Results  Component Value Date   WBC 5.0 11/02/2016   NEUTROABS 3.8 11/02/2016   HGB 8.4 (L) 11/02/2016   HCT 25.7 (L) 11/02/2016   MCV 75.6 (L) 11/02/2016   PLT 348 11/02/2016      Chemistry  Component Value Date/Time   NA 131 (L) 11/02/2016 0900   K 3.7 11/02/2016 0900   CL 102 11/02/2016 0900   CO2 23 11/02/2016 0900   BUN 11 11/02/2016 0900   CREATININE 0.65 11/02/2016 0900      Component Value Date/Time   CALCIUM 8.4 (L) 11/02/2016 0900   ALKPHOS 65 11/02/2016 0900   AST 21 11/02/2016 0900   ALT 11 (L) 11/02/2016 0900   BILITOT 0.4 11/02/2016 0900       RADIOGRAPHIC STUDIES: I have personally reviewed the radiological images as listed and agreed with the findings in the report. No results found.   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 Currently on alimta s/p cycle #2. No clinical progression noted.  # proceed with cycle #3. Labs today reviewed;  acceptable for treatment today.   # Depression- started on Remeron; stable.  # Nausea- ? Anticipatory-add Ativan prior to chemotherapy. Also recommend IV Compazine..   # weight loss/ poor apetitie-not improving.  Increase to 52m/day Prednisone. Await dietary evaluation today.  # Peripheral neuropathy- G-1. Monitor for now.   # follow up in 3 weeks/HOLD tube; lab/chemo; CT prior- C/A/P; 6 weeks-MD.   Orders Placed This Encounter  Procedures  . CT ABDOMEN PELVIS W CONTRAST    Standing Status:   Future     Standing Expiration Date:   02/01/2018    Order Specific Question:   Reason for Exam (SYMPTOM  OR DIAGNOSIS REQUIRED)    Answer:   cervical cancer    Order Specific Question:   Preferred imaging location?    Answer:   [facility]  . CT CHEST W CONTRAST    Standing Status:   Future    Standing Expiration Date:   01/02/2018    Order Specific Question:   Reason for Exam (SYMPTOM  OR DIAGNOSIS REQUIRED)    Answer:   cervical cancer    Order Specific Question:   Preferred imaging location?    Answer:   [facility]  . CBC with Differential    Standing Status:   Standing    Number of Occurrences:   20    Standing Expiration Date:   11/02/2017  . Comprehensive metabolic panel    Standing Status:   Standing    Number of Occurrences:   20    Standing Expiration Date:   11/02/2017      GCammie Sickle MD 11/02/2016 1:21 PM        GCammie Sickle MD 11/02/2016 1:21 PM

## 2016-11-02 NOTE — Assessment & Plan Note (Addendum)
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 Currently on alimta s/p cycle #2. No clinical progression noted.  # proceed with cycle #3. Labs today reviewed;  acceptable for treatment today.   # Depression- started on Remeron; stable.  # Nausea- ? Anticipatory-add Ativan prior to chemotherapy. Also recommend IV Compazine..   # weight loss/ poor apetitie-not improving.  Increase to 20mg /day Prednisone. Await dietary evaluation today.  # Peripheral neuropathy- G-1. Monitor for now.   # follow up in 3 weeks/HOLD tube; lab/chemo; CT prior- C/A/P; 6 weeks-MD.

## 2016-11-10 IMAGING — CT CT CHEST W/ CM
2 of 3 series · 13 of 30 positions shown, 16 images · IV contrast (iopamidol)
Comparison: 11/12/2015 PET-CT, 10/29/2015 chest CT and 10/25/2015
CT abdomen/ pelvis.

CLINICAL DATA: Restaging endometrial cancer diagnosed November 2015,
with ongoing chemotherapy.

EXAM:
CT CHEST, ABDOMEN, AND PELVIS WITH CONTRAST
TECHNIQUE: Multidetector CT imaging of the chest, abdomen and pelvis was
performed following the standard protocol during bolus
administration of intravenous contrast.
CONTRAST:  85mL [id number] IOPAMIDOL ([id number]) INJECTION 61%

[Series 2: cap with · axial · 0.68mm/px · z∈[-976,-451]mm · 11 of 129 slices shown, 14 images]
[im 12/129  mediastinal]
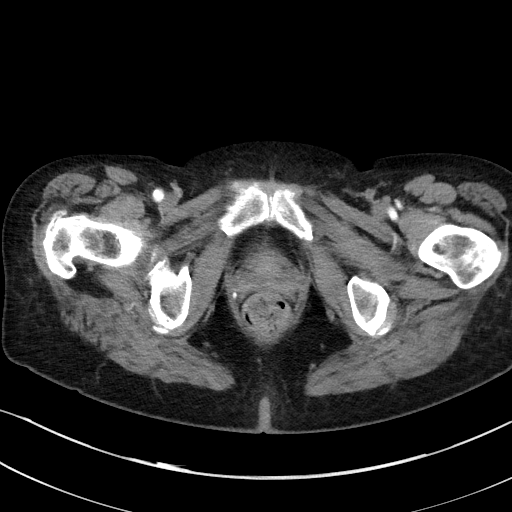
[im 12/129  lung]
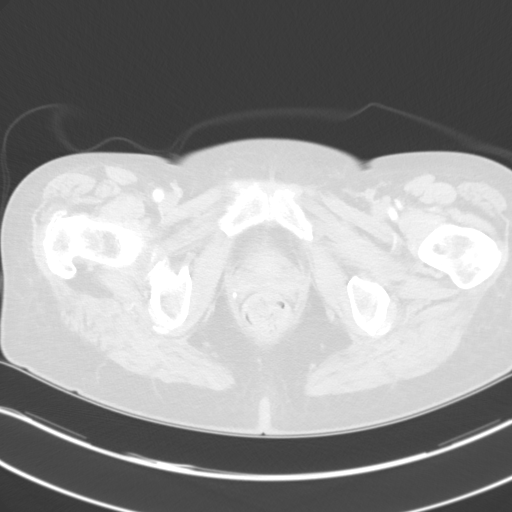
[im 24/129  lung]
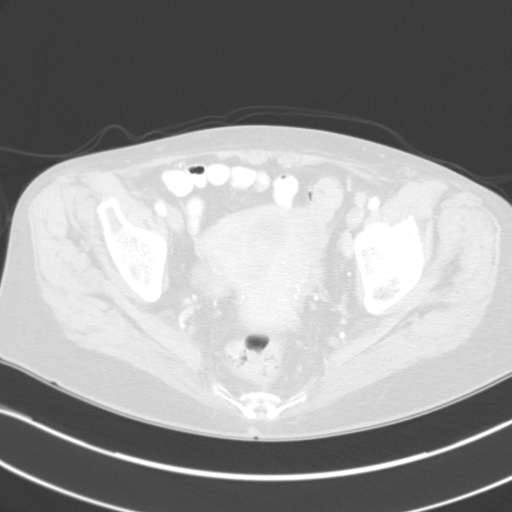
[im 35/129  lung]
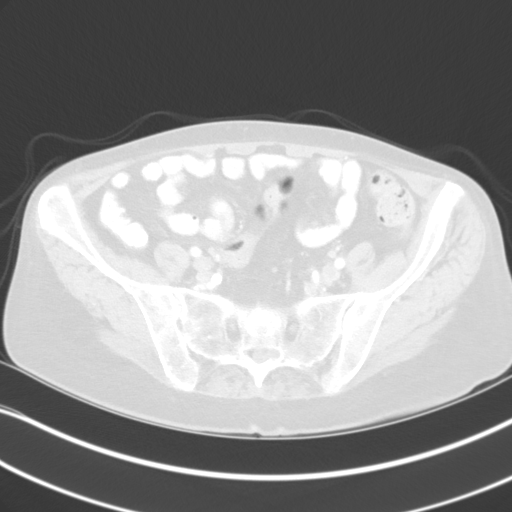
[im 47/129  lung]
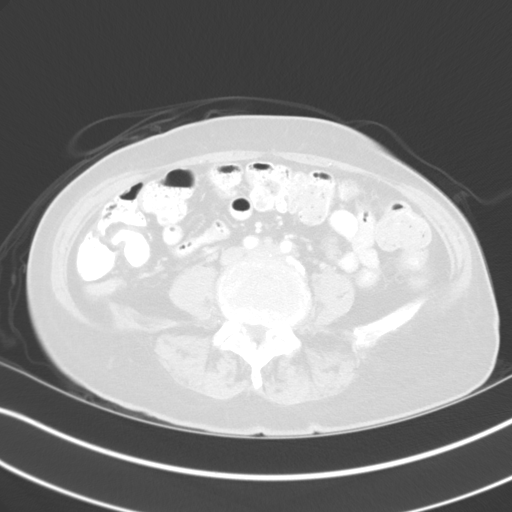
[im 59/129  mediastinal]
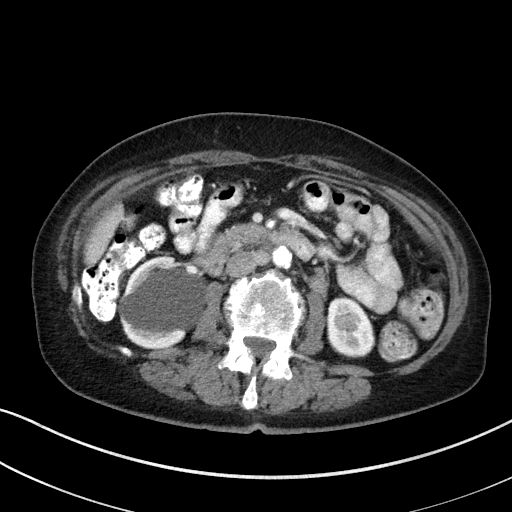
[im 59/129  lung]
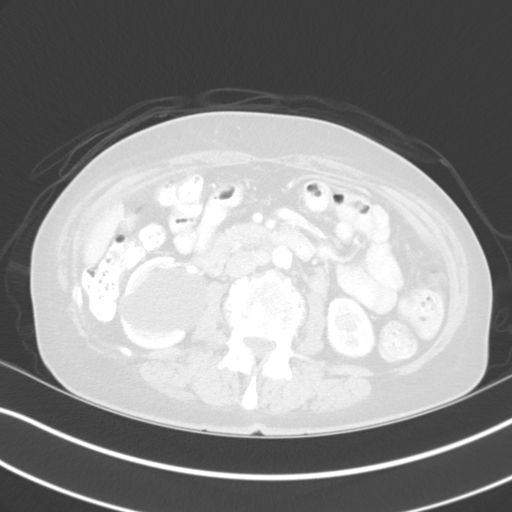
[im 64/129  lung]
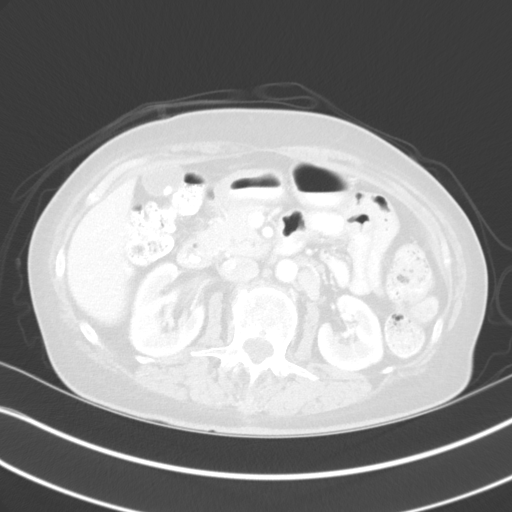
[im 70/129  lung]
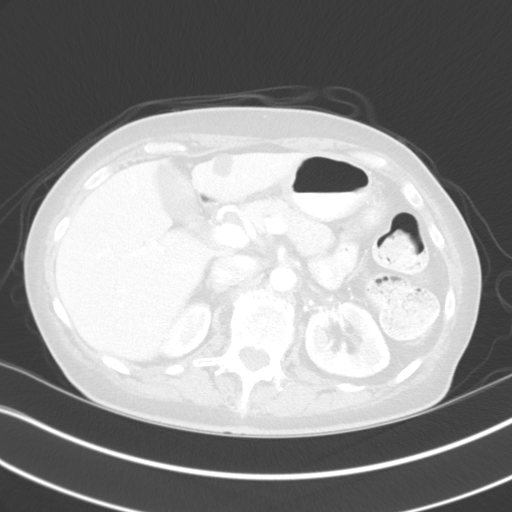
[im 82/129  lung]
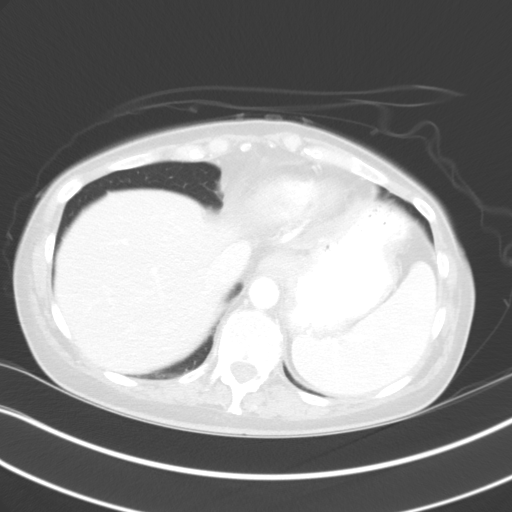
[im 94/129  mediastinal]
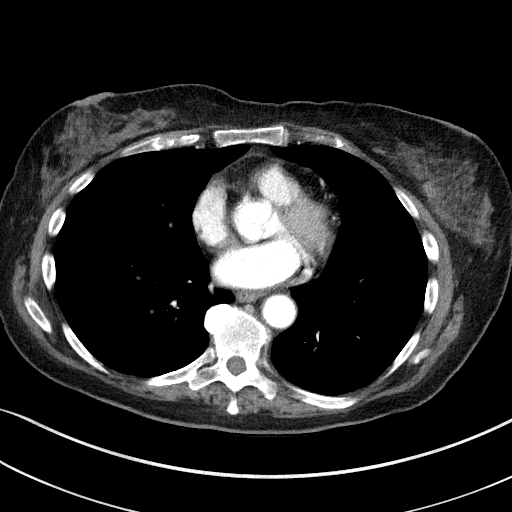
[im 94/129  lung]
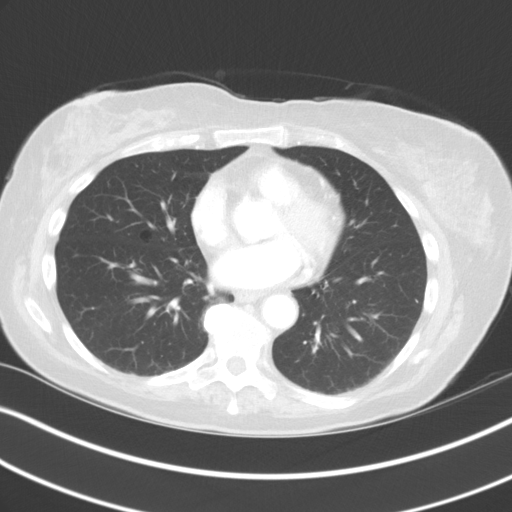
[im 105/129  lung]
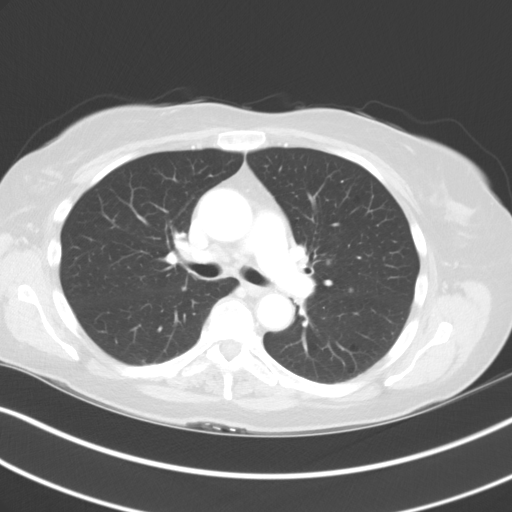
[im 117/129  lung]
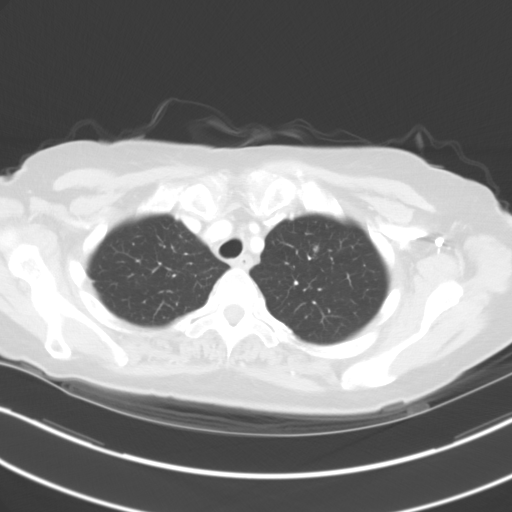

[Series 4: lung · axial · 0.65mm/px · z∈[-657,-609]mm · 2 of 146 slices shown]
[im 13/146  lung]
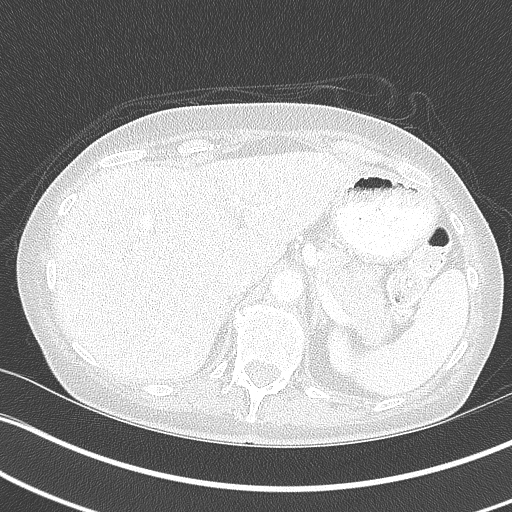
[im 37/146  lung]
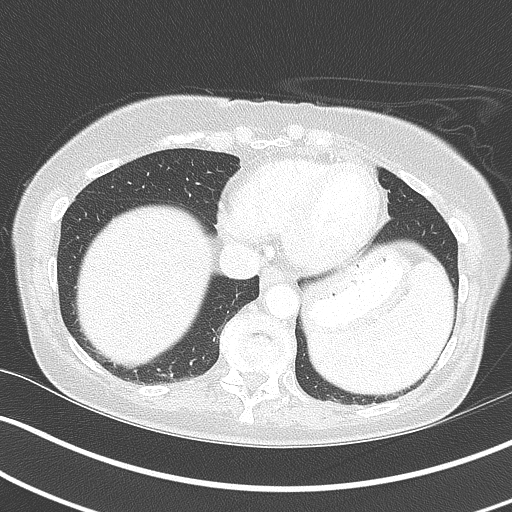

[13 of 30 positions shown; findings below may reference images not displayed]

FINDINGS: CT CHEST

Mediastinum/Nodes: Normal heart size. Stable trace pericardial
fluid/ thickening anteriorly. Left anterior descending coronary
atherosclerosis. Great vessels are normal in course and caliber. No
central pulmonary emboli. Normal visualized thyroid. Normal
esophagus. No residual pathologically enlarged axillary, mediastinal
or hilar lymph nodes. Right hilar 0.9 cm top-normal node (series 2/
image 27) is decreased from 1.1 cm.

Lungs/Pleura: No pneumothorax. No pleural effusion. There are
greater than 10 subcentimeter pulmonary nodules scattered throughout
both lungs, a few of which are new, a few of which are stable and a
few of which are mildly increased in size. For example there is a
new 0.4 cm apical left upper lobe pulmonary nodule (series 4/ image
27). A 0.6 cm apical right upper lobe pulmonary nodule (series 4/
image 35) is increased from 0.3 cm. A left upper lobe 0.6 cm
pulmonary nodule (series 4/ image 59) is increased from 0.4 cm. A
0.6 cm superior segment left lower lobe pulmonary nodule (series 4/
image 55) is stable. No acute consolidative airspace disease .

Musculoskeletal: No aggressive appearing focal osseous lesions. Mild
degenerative changes in the thoracic spine. No appreciable change in
subcentimeter sclerotic lesion in the lateral left sixth rib,
indeterminate, possibly a benign bone island.

CT ABDOMEN AND PELVIS

Hepatobiliary: Simple 1.5 cm lateral segment left liver lobe cyst.
Otherwise normal liver. Layering 8 mm calcified gallstone in the
nondistended gallbladder, with no gallbladder wall thickening or
pericholecystic fluid. No biliary ductal dilatation.

Pancreas: Normal, with no mass or duct dilation.

Spleen: Normal size. No mass.

Adrenals/Urinary Tract: Normal adrenals. No hydronephrosis. Simple
6.0 cm renal cyst in the mid to lower right kidney. Additional
subcentimeter hypodense renal cortical lesions in both kidneys are
too small to characterize and are not appreciably changed. Normal
bladder.

Stomach/Bowel: Grossly normal stomach. Normal caliber small bowel
with no small bowel wall thickening. There are 2 low-attenuation
serosal nodules at the tip of the appendix, largest 1.0 cm (series
5/ image 79), increased from 0.4 cm. Otherwise normal appendix.
Normal large bowel with no diverticulosis, large bowel wall
thickening or pericolonic fat stranding.

Vascular/Lymphatic: Atherosclerotic nonaneurysmal abdominal aorta.
Patent portal, splenic, hepatic and renal veins. Aortocaval and left
para-aortic lymphadenopathy has mildly increased, for example a
cm left para-aortic node (series 2/image 60), increased from 1.7 cm.
Left common iliac 1.6 cm node (series 2/image 79) is increased from
1.4 cm. Bilateral external iliac adenopathy is mildly increased, for
example a 1.6 cm right external iliac node (series 2/image 102),
increased from 1.4 cm. Bilateral inguinal lymphadenopathy is mildly
increased, for example a 1.7 cm right inguinal node (series 2/ image
115), mildly increased from 1.4 cm. Multiple mildly enlarged
perirectal nodes appear stable, largest 1.1 cm on the left (series
2/ image 100).

Reproductive: The enlarged anteverted heterogeneous uterus measures
10.5 x 6.4 x 8.3 cm in size, previously 11.1 x 6.1 x 8.5 cm, not
appreciably changed. There is no appreciable change in the
infiltrative 3.5 x 2.8 cm mass in the lower endometrial
cavity/cervix (series 6/image 100), better delineated on the
11/12/2015 pelvic MRI. Coarse calcifications throughout the uterus
are from calcified uterine fibroids. Bilateral heterogeneous adnexal
masses have mildly increased in size, for example 3.9 x 3.2 cm on
the right (series 2/image 103), previously 3.8 x 2.7 cm, mildly
increased and 4.3 x 3.4 cm on the left (series 2/image 102),
previously 4.3 x 3.0 cm, mildly increased.

Other: No pneumoperitoneum, ascites or focal fluid collection.

Musculoskeletal: No aggressive appearing focal osseous lesions.
Moderate degenerative changes in the lumbar spine.
IMPRESSION: 1. Pulmonary metastases have mildly increased in size and number.
2. Extensive retroperitoneal and bilateral pelvic lymphadenopathy is
mildly increased in size.
3. Bilateral adnexal metastases are mildly increased in size.
4. Small serosal tumor implants at the tip of the appendix have
increased in size.
5. Primary endometrial mass is not appreciably changed.
6. Additional findings include coronary atherosclerosis and
cholelithiasis.

## 2016-11-15 ENCOUNTER — Encounter: Payer: Self-pay | Admitting: Emergency Medicine

## 2016-11-15 ENCOUNTER — Emergency Department
Admission: EM | Admit: 2016-11-15 | Discharge: 2016-11-15 | Disposition: A | Discharge status: Home / Self Care | Payer: Medicare Other | Attending: Emergency Medicine | Admitting: Emergency Medicine

## 2016-11-15 DIAGNOSIS — R110 Nausea: Secondary | ICD-10-CM | POA: Diagnosis not present

## 2016-11-15 DIAGNOSIS — Z7982 Long term (current) use of aspirin: Secondary | ICD-10-CM | POA: Diagnosis not present

## 2016-11-15 DIAGNOSIS — C7982 Secondary malignant neoplasm of genital organs: Secondary | ICD-10-CM | POA: Insufficient documentation

## 2016-11-15 DIAGNOSIS — R531 Weakness: Secondary | ICD-10-CM | POA: Diagnosis present

## 2016-11-15 DIAGNOSIS — I10 Essential (primary) hypertension: Secondary | ICD-10-CM | POA: Diagnosis not present

## 2016-11-15 DIAGNOSIS — R630 Anorexia: Secondary | ICD-10-CM

## 2016-11-15 DIAGNOSIS — T451X5A Adverse effect of antineoplastic and immunosuppressive drugs, initial encounter: Secondary | ICD-10-CM

## 2016-11-15 LAB — BASIC METABOLIC PANEL
Anion gap: 10 (ref 5–15)
BUN: 9 mg/dL (ref 6–20)
CHLORIDE: 97 mmol/L — AB (ref 101–111)
CO2: 23 mmol/L (ref 22–32)
CREATININE: 0.51 mg/dL (ref 0.44–1.00)
Calcium: 8.4 mg/dL — ABNORMAL LOW (ref 8.9–10.3)
GFR calc Af Amer: >60 mL/min (ref >60)
GFR calc non Af Amer: >60 mL/min (ref >60)
Glucose, Bld: 115 mg/dL — ABNORMAL HIGH (ref 65–99)
Potassium: 3.6 mmol/L (ref 3.5–5.1)
SODIUM: 130 mmol/L — AB (ref 135–145)

## 2016-11-15 LAB — CBC WITH DIFFERENTIAL/PLATELET
BASOS ABS: 0 10*3/uL (ref 0–0.1)
Basophils Relative: 0 %
Eosinophils Absolute: 0 10*3/uL (ref 0–0.7)
Eosinophils Relative: 0 %
HEMATOCRIT: 25.3 % — AB (ref 35.0–47.0)
HEMOGLOBIN: 8.4 g/dL — AB (ref 12.0–16.0)
LYMPHS PCT: 8 %
Lymphs Abs: 0.3 10*3/uL — ABNORMAL LOW (ref 1.0–3.6)
MCH: 25.4 pg — ABNORMAL LOW (ref 26.0–34.0)
MCHC: 33.3 g/dL (ref 32.0–36.0)
MCV: 76.5 fL — AB (ref 80.0–100.0)
MONO ABS: 0.4 10*3/uL (ref 0.2–0.9)
Monocytes Relative: 12 %
NEUTROS ABS: 2.8 10*3/uL (ref 1.4–6.5)
Neutrophils Relative %: 80 %
Platelets: 232 10*3/uL (ref 150–440)
RBC: 3.3 MIL/uL — ABNORMAL LOW (ref 3.80–5.20)
RDW: 21.7 % — ABNORMAL HIGH (ref 11.5–14.5)
WBC: 3.5 10*3/uL — AB (ref 3.6–11.0)

## 2016-11-15 LAB — URINALYSIS, COMPLETE (UACMP) WITH MICROSCOPIC
Bacteria, UA: NONE SEEN
Bilirubin Urine: NEGATIVE
GLUCOSE, UA: NEGATIVE mg/dL
Ketones, ur: 5 mg/dL — AB
Nitrite: NEGATIVE
PROTEIN: 30 mg/dL — AB
Specific Gravity, Urine: 1.015 (ref 1.005–1.030)
pH: 5 (ref 5.0–8.0)

## 2016-11-15 LAB — TROPONIN I: Troponin I: <0.03 ng/mL (ref <0.03)

## 2016-11-15 MED ORDER — ONDANSETRON HCL 4 MG/2ML IJ SOLN
4.0000 mg | INTRAMUSCULAR | Status: AC
  Administered 2016-11-15: 4 mg via INTRAVENOUS

## 2016-11-15 MED ORDER — ONDANSETRON HCL 4 MG/2ML IJ SOLN
INTRAMUSCULAR | Status: AC
  Filled 2016-11-15: qty 2

## 2016-11-15 MED ORDER — SODIUM CHLORIDE 0.9 % IV BOLUS (SEPSIS)
1000.0000 mL | Freq: Once | INTRAVENOUS | Status: AC
  Administered 2016-11-15: 1000 mL via INTRAVENOUS

## 2016-11-15 MED ORDER — AMLODIPINE BESYLATE 5 MG PO TABS
10.0000 mg | ORAL_TABLET | Freq: Once | ORAL | Status: AC
  Administered 2016-11-15: 10 mg via ORAL
  Filled 2016-11-15: qty 2

## 2016-11-15 MED ORDER — ONDANSETRON HCL 4 MG/5ML PO SOLN: ORAL | 0 refills | Status: AC

## 2016-11-15 MED ORDER — ONDANSETRON HCL 4 MG/2ML IJ SOLN
4.0000 mg | INTRAMUSCULAR | Status: AC
  Administered 2016-11-15: 4 mg via INTRAVENOUS
  Filled 2016-11-15: qty 2

## 2016-11-15 MED ORDER — PROMETHAZINE HCL 12.5 MG PO TABS: 12.5000 mg | ORAL_TABLET | Freq: Three times a day (TID) | ORAL | 0 refills | Status: AC | PRN

## 2016-11-15 NOTE — Discharge Instructions (Signed)
You were evaluated for fatigue and nausea and loss of appetite during chemotherapy treatment.  We discussed adding liquid Zofran if you can tolerate that better.  Please discuss with your oncologist and/or primary care doctor regarding any additional plan for stimulating your appetite and getting adequate nutrition.  Return to the emergency department immediately for any worsening symptoms including one-sided weakness or numbness, confusion or altered mental status, chest pain, trouble breathing, depression, or any thoughts of wanting to hurt herself or others.

## 2016-11-15 NOTE — ED Triage Notes (Signed)
Pt states she has metastatic cervical CA and had chemo about 2 weeks ago but has not been able to eat anything and has increased weakness.

## 2016-11-15 NOTE — ED Provider Notes (Signed)
[facility] Emergency Department Provider Note ____________________________________________   I have reviewed the triage vital signs and the triage nursing note.  HISTORY  Chief Complaint Weakness   Historian Patient HPI Jennifer Berry is a 71 y.o. female with a history of GYN metastatic cancer, primary cervical cancer with metastasis, currently on chemotherapy, last chemotherapy was about 2 weeks ago per the patient and her significant other, presents today due to feeling nausea and fatigue. She states that she really has not been able to eat anything significant for a month or so. She states that she has no appetite for food. She has no taste for food. She states her oncologist told her that the new chemotherapy regimen would not typically give her this type of trouble. She states she's lost a significant amount of weight. She states that even if she tries to take something like a boost shake that she will feel very nauseated and might spit it back up.  No abdominal pain. No chest pain. No fever. No cough. No diarrhea.  She is a bit of a poor historian regarding which medications have been tried for nausea. She states that the pill ondansetron makes her stomach burn. She states that she has the dissolvable tablets and they don't seem to work. She is not quite sure she is taking 4 mg or 8 mg. She doesn't think that she's tried Phenergan and Reglan.    Past Medical History:  Diagnosis Date  . Anxiety   . Carpal tunnel syndrome   . Chemotherapy-induced nausea and vomiting   . Decrease in appetite   . Decreased appetite   . Depression   . Endometrial cancer ([place]) 11/06/2015  . Hypertension   . Lymphadenopathy 10/25/15  . Nausea & vomiting   . Port-a-cath in place   . Weight loss    30 lb weight loss since October 2016  . Weight loss     Patient Active Problem List   Diagnosis Date Noted  . Counseling regarding goals of care 09/14/2016  . Dyspnea on exertion  08/17/2016  . Weight loss 03/23/2016  . Nausea without vomiting 02/20/2016  . Hypertension 02/20/2016  . Poor intravenous access 02/20/2016  . 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 Primary cervical cancer with metastasis to other site ([place]) 01/16/2016  . Dehydration 12/06/2015    Past Surgical History:  Procedure Laterality Date  . CARPAL TUNNEL RELEASE Left   . FLEXIBLE SIGMOIDOSCOPY  05/2015   performed by Dr. Petra Kuba,  . PERIPHERAL VASCULAR CATHETERIZATION N/A 02/25/2016   Procedure: Glori Luis Cath Insertion;  Surgeon: Katha Cabal, MD;  Location: [facility];  Service: Cardiovascular;  Laterality: N/A;  . SKIN CANCER EXCISION      Prior to Admission medications   Medication Sig Start Date End Date Taking? Authorizing Provider  amLODipine (NORVASC) 10 MG tablet Take 1 tablet by mouth daily. Reported on 11/25/2015 07/23/15   Historical Provider, MD  aspirin EC 81 MG tablet Take 81 mg by mouth daily. Reported on 01/31/2016    Historical Provider, MD  folic acid (FOLVITE) 1 MG tablet Take 1 tablet (1 mg total) by mouth daily. 09/18/16   Cammie Sickle, MD  hydrochlorothiazide (HYDRODIURIL) 25 MG tablet Take 1 tablet by mouth daily. Reported on 01/31/2016 07/23/15   Historical Provider, MD  lidocaine-prilocaine (EMLA) cream Apply 1 application topically as needed. To port site 02/25/16   Cammie Sickle, MD  losartan (COZAAR) 100 MG tablet Take  1 tablet by mouth daily. Reported on 11/25/2015 07/23/15   Historical Provider, MD  ondansetron [facility]) 4 MG/5ML solution 4-8 mg po every 8 hours as needed for nausea 11/15/16   Lisa Roca, MD  predniSONE (DELTASONE) 20 MG tablet Take 1/2 pill every day with break fast. 10/12/16   Cammie Sickle, MD  prochlorperazine (COMPAZINE) 10 MG tablet Take 1 tablet (10 mg total) by mouth every 6 (six) hours as needed for nausea or vomiting. 11/15/15   Cammie Sickle, MD  promethazine (PHENERGAN) 12.5 MG tablet Take 1  tablet (12.5 mg total) by mouth every 8 (eight) hours as needed for nausea or vomiting. 11/15/16   Lisa Roca, MD    Allergies  Allergen Reactions  . Ibuprofen Swelling  . Tape Rash    Family History  Problem Relation Age of Onset  . Breast cancer Sister   . Hypertension Sister   . Hypertension Brother   . Ovarian cancer Paternal Aunt   . Diabetes Maternal Grandmother   . Breast cancer Daughter   . Breast cancer Cousin     maternal    Social History Social History  Substance Use Topics  . Smoking status: Never Smoker  . Smokeless tobacco: Never Used  . Alcohol use No    Review of Systems  Constitutional: Negative for fever. Eyes: Negative for visual changes. ENT: Negative for sore throat. Cardiovascular: Negative for chest pain. Respiratory: Negative for shortness of breath. Gastrointestinal: Negative for abdominal pain, vomiting and diarrhea. Genitourinary: Negative for dysuria. Musculoskeletal: Negative for back pain. Skin: Negative for rash. Neurological: Negative for headache. 10 point Review of Systems otherwise negative ____________________________________________   PHYSICAL EXAM:  VITAL SIGNS: ED Triage Vitals  Enc Vitals Group     BP 11/15/16 1201 135/79     Pulse Rate 11/15/16 1201 (!) 110     Resp 11/15/16 1201 20     Temp 11/15/16 1201 98.6 F (37 C)     Temp Source 11/15/16 1201 Oral     SpO2 11/15/16 1201 100 %     Weight 11/15/16 1202 120 lb (54.4 kg)     Height 11/15/16 1202 5\' 7"  (1.702 m)     Head Circumference --      Peak Flow --      Pain Score 11/15/16 1202 0     Pain Loc --      Pain Edu? --      Excl. in [place]? --      Constitutional: Alert and oriented. Well appearing and in no distress. HEENT   Head: Normocephalic and atraumatic.      Eyes: Conjunctivae are normal. PERRL. Normal extraocular movements.      Ears:         Nose: No congestion/rhinnorhea.   Mouth/Throat: Mucous membranes are mildly dry.   Neck: No  stridor. Cardiovascular/Chest: Normal rate, regular rhythm.  No murmurs, rubs, or gallops. Respiratory: Normal respiratory effort without tachypnea nor retractions. Breath sounds are clear and equal bilaterally. No wheezes/rales/rhonchi. Gastrointestinal: Soft. No distention, no guarding, no rebound. Nontender.  Genitourinary/rectal:Deferred Musculoskeletal: Nontender with normal range of motion in all extremities. No joint effusions.  No lower extremity tenderness.  No edema. Neurologic:  Normal speech and language. No gross or focal neurologic deficits are appreciated. Skin:  Skin is warm, dry and intact. No rash noted. Psychiatric: Mood and affect are normal. Speech and behavior are normal. Patient exhibits appropriate insight and judgment.   ____________________________________________  LABS (pertinent positives/negatives)  Labs  Reviewed  BASIC METABOLIC PANEL - Abnormal; Notable for the following:       Result Value   Sodium 130 (*)    Chloride 97 (*)    Glucose, Bld 115 (*)    Calcium 8.4 (*)    All other components within normal limits  URINALYSIS, COMPLETE (UACMP) WITH MICROSCOPIC - Abnormal; Notable for the following:    Color, Urine YELLOW (*)    APPearance HAZY (*)    Hgb urine dipstick SMALL (*)    Ketones, ur 5 (*)    Protein, ur 30 (*)    Leukocytes, UA MODERATE (*)    Squamous Epithelial / LPF 6-30 (*)    All other components within normal limits  CBC WITH DIFFERENTIAL/PLATELET - Abnormal; Notable for the following:    WBC 3.5 (*)    RBC 3.30 (*)    Hemoglobin 8.4 (*)    HCT 25.3 (*)    MCV 76.5 (*)    MCH 25.4 (*)    RDW 21.7 (*)    Lymphs Abs 0.3 (*)    All other components within normal limits  TROPONIN I  CBG MONITORING, ED    ____________________________________________    EKG I, Lisa Roca, MD, the attending physician have personally viewed and interpreted all ECGs.  115 bpm. Sinus tachycardia.  Narrow QRS. Normal axis. Nonspecific ST and  T-wave. ____________________________________________  RADIOLOGY All Xrays were viewed by me. Imaging interpreted by Radiologist.  None __________________________________________  PROCEDURES  Procedure(s) performed: None  Critical Care performed: None  ____________________________________________   ED COURSE / ASSESSMENT AND PLAN  Pertinent labs & imaging results that were available during my care of the patient were reviewed by me and considered in my medical decision making (see chart for details).   Jennifer Berry is here with her significant other, sounds like she has been having a decline due to the nausea and decreased appetite during chemotherapy treatment for metastatic cervical cancer. They're asking me about appetite stimulants, and asked her to have this discussion with her oncologist. It sounds like she has tried for an 8 mg of Zofran, but states that she can't really take the pills because it burns on her stomach. She is requesting a liquid preparation.  From the fatigue standpoint, there are no focal neurologic complaints nor findings on exam. She has not recently been ill in terms of fevers or coughing or chest pain. EKG initially showed sinus tachycardia, but when I been in there she has had a heart rate less than 100. She received 1 L normal saline I will give her second liter since she hasn't been able to urinate yet.   She seems a little withdrawn, although she is able to talk to me and her significant other. She seems a little depressed, stating that she doesn't have any today where she doesn't feel sick. I asked her to discuss goals of therapy with her primary care doctor and her oncologist. She is not endorsing specific depression or suicidal ideation.     CONSULTATIONS:  None   Patient / Family / Caregiver informed of clinical course, medical decision-making process, and agree with plan.   I discussed return precautions, follow-up instructions, and discharge  instructions with patient and/or family.  Discharge instructions:  You were evaluated for fatigue and nausea and loss of appetite during chemotherapy treatment.  We discussed adding liquid Zofran if you can tolerate that better.  Please discuss with your oncologist and/or primary care doctor regarding any additional plan  for stimulating your appetite and getting adequate nutrition.  Return to the emergency department immediately for any worsening symptoms including one-sided weakness or numbness, confusion or altered mental status, chest pain, trouble breathing, depression, or any thoughts of wanting to hurt herself or others. ___________________________________________   FINAL CLINICAL IMPRESSION(S) / ED DIAGNOSES   Final diagnoses:  Decreased appetite  Chemotherapy-induced nausea              Note: This dictation was prepared with Dragon dictation. Any transcriptional errors that result from this process are unintentional    Lisa Roca, MD 11/15/16 1842

## 2016-11-20 ENCOUNTER — Telehealth: Payer: Self-pay | Admitting: *Deleted

## 2016-11-20 ENCOUNTER — Ambulatory Visit
Admission: RE | Admit: 2016-11-20 | Discharge: 2016-11-20 | Disposition: A | Discharge status: Home / Self Care | Payer: Medicare Other | Source: Ambulatory Visit | Attending: Internal Medicine | Admitting: Internal Medicine

## 2016-11-20 DIAGNOSIS — C539 Malignant neoplasm of cervix uteri, unspecified: Secondary | ICD-10-CM | POA: Diagnosis present

## 2016-11-20 DIAGNOSIS — C7801 Secondary malignant neoplasm of right lung: Secondary | ICD-10-CM | POA: Insufficient documentation

## 2016-11-20 DIAGNOSIS — C7802 Secondary malignant neoplasm of left lung: Secondary | ICD-10-CM | POA: Diagnosis not present

## 2016-11-20 DIAGNOSIS — C799 Secondary malignant neoplasm of unspecified site: Secondary | ICD-10-CM | POA: Diagnosis present

## 2016-11-20 DIAGNOSIS — C775 Secondary and unspecified malignant neoplasm of intrapelvic lymph nodes: Secondary | ICD-10-CM | POA: Insufficient documentation

## 2016-11-20 MED ORDER — IOPAMIDOL (ISOVUE-300) INJECTION 61%
75.0000 mL | Freq: Once | INTRAVENOUS | Status: AC | PRN
  Administered 2016-11-20: 75 mL via INTRAVENOUS

## 2016-11-20 NOTE — Telephone Encounter (Signed)
Erroneous error

## 2016-11-23 ENCOUNTER — Inpatient Hospital Stay: Payer: Medicare Other

## 2016-11-23 ENCOUNTER — Other Ambulatory Visit: Payer: Self-pay

## 2016-11-23 ENCOUNTER — Inpatient Hospital Stay (HOSPITAL_BASED_OUTPATIENT_CLINIC_OR_DEPARTMENT_OTHER): Payer: Medicare Other | Admitting: Internal Medicine

## 2016-11-23 ENCOUNTER — Inpatient Hospital Stay: Payer: Medicare Other | Attending: Internal Medicine

## 2016-11-23 ENCOUNTER — Other Ambulatory Visit: Payer: Self-pay | Admitting: *Deleted

## 2016-11-23 VITALS — BP 151/83 | HR 97 | Temp 97.4°F | Resp 18 | Wt 127.5 lb

## 2016-11-23 DIAGNOSIS — Z803 Family history of malignant neoplasm of breast: Secondary | ICD-10-CM | POA: Insufficient documentation

## 2016-11-23 DIAGNOSIS — D649 Anemia, unspecified: Secondary | ICD-10-CM | POA: Diagnosis not present

## 2016-11-23 DIAGNOSIS — C7800 Secondary malignant neoplasm of unspecified lung: Secondary | ICD-10-CM | POA: Insufficient documentation

## 2016-11-23 DIAGNOSIS — C7951 Secondary malignant neoplasm of bone: Secondary | ICD-10-CM | POA: Diagnosis not present

## 2016-11-23 DIAGNOSIS — Z7952 Long term (current) use of systemic steroids: Secondary | ICD-10-CM | POA: Diagnosis not present

## 2016-11-23 DIAGNOSIS — Z79899 Other long term (current) drug therapy: Secondary | ICD-10-CM | POA: Diagnosis not present

## 2016-11-23 DIAGNOSIS — R5383 Other fatigue: Secondary | ICD-10-CM

## 2016-11-23 DIAGNOSIS — R0602 Shortness of breath: Secondary | ICD-10-CM | POA: Diagnosis not present

## 2016-11-23 DIAGNOSIS — D6481 Anemia due to antineoplastic chemotherapy: Secondary | ICD-10-CM | POA: Insufficient documentation

## 2016-11-23 DIAGNOSIS — C539 Malignant neoplasm of cervix uteri, unspecified: Secondary | ICD-10-CM | POA: Diagnosis present

## 2016-11-23 DIAGNOSIS — G629 Polyneuropathy, unspecified: Secondary | ICD-10-CM | POA: Insufficient documentation

## 2016-11-23 DIAGNOSIS — C774 Secondary and unspecified malignant neoplasm of inguinal and lower limb lymph nodes: Secondary | ICD-10-CM | POA: Insufficient documentation

## 2016-11-23 DIAGNOSIS — F419 Anxiety disorder, unspecified: Secondary | ICD-10-CM | POA: Insufficient documentation

## 2016-11-23 DIAGNOSIS — T451X5A Adverse effect of antineoplastic and immunosuppressive drugs, initial encounter: Secondary | ICD-10-CM

## 2016-11-23 DIAGNOSIS — Z923 Personal history of irradiation: Secondary | ICD-10-CM | POA: Diagnosis not present

## 2016-11-23 DIAGNOSIS — Z8041 Family history of malignant neoplasm of ovary: Secondary | ICD-10-CM | POA: Diagnosis not present

## 2016-11-23 DIAGNOSIS — F329 Major depressive disorder, single episode, unspecified: Secondary | ICD-10-CM | POA: Diagnosis not present

## 2016-11-23 DIAGNOSIS — I10 Essential (primary) hypertension: Secondary | ICD-10-CM | POA: Diagnosis not present

## 2016-11-23 LAB — PREPARE RBC (CROSSMATCH)

## 2016-11-23 LAB — COMPREHENSIVE METABOLIC PANEL
ALK PHOS: 71 U/L (ref 38–126)
ALT: 35 U/L (ref 14–54)
AST: 51 U/L — AB (ref 15–41)
Albumin: 2.2 g/dL — ABNORMAL LOW (ref 3.5–5.0)
Anion gap: 7 (ref 5–15)
BILIRUBIN TOTAL: 0.3 mg/dL (ref 0.3–1.2)
BUN: 9 mg/dL (ref 6–20)
CALCIUM: 8.6 mg/dL — AB (ref 8.9–10.3)
CHLORIDE: 97 mmol/L — AB (ref 101–111)
CO2: 24 mmol/L (ref 22–32)
CREATININE: 0.63 mg/dL (ref 0.44–1.00)
GFR calc Af Amer: >60 mL/min (ref >60)
Glucose, Bld: 244 mg/dL — ABNORMAL HIGH (ref 65–99)
Potassium: 3.5 mmol/L (ref 3.5–5.1)
Sodium: 128 mmol/L — ABNORMAL LOW (ref 135–145)
Total Protein: 7.7 g/dL (ref 6.5–8.1)

## 2016-11-23 LAB — CBC WITH DIFFERENTIAL/PLATELET
BASOS ABS: 0 10*3/uL (ref 0–0.1)
Basophils Relative: 0 %
Eosinophils Absolute: 0 10*3/uL (ref 0–0.7)
Eosinophils Relative: 0 %
HEMATOCRIT: 22.9 % — AB (ref 35.0–47.0)
HEMOGLOBIN: 7.4 g/dL — AB (ref 12.0–16.0)
LYMPHS ABS: 0.1 10*3/uL — AB (ref 1.0–3.6)
LYMPHS PCT: 3 %
MCH: 25.1 pg — AB (ref 26.0–34.0)
MCHC: 32.4 g/dL (ref 32.0–36.0)
MCV: 77.2 fL — ABNORMAL LOW (ref 80.0–100.0)
Monocytes Absolute: 0.4 10*3/uL (ref 0.2–0.9)
Monocytes Relative: 7 %
NEUTROS ABS: 4.6 10*3/uL (ref 1.4–6.5)
NEUTROS PCT: 90 %
PLATELETS: 344 10*3/uL (ref 150–440)
RBC: 2.97 MIL/uL — AB (ref 3.80–5.20)
RDW: 22 % — ABNORMAL HIGH (ref 11.5–14.5)
WBC: 5.1 10*3/uL (ref 3.6–11.0)

## 2016-11-23 LAB — SAMPLE TO BLOOD BANK

## 2016-11-23 LAB — ABO/RH: ABO/RH(D): A POS

## 2016-11-23 MED ORDER — HEPARIN SOD (PORK) LOCK FLUSH 100 UNIT/ML IV SOLN
500.0000 [IU] | Freq: Once | INTRAVENOUS | Status: AC
  Filled 2016-11-23: qty 5

## 2016-11-23 MED ORDER — PREDNISONE 20 MG PO TABS: ORAL_TABLET | ORAL | 0 refills | Status: DC

## 2016-11-23 MED ORDER — SODIUM CHLORIDE 0.9% FLUSH
10.0000 mL | INTRAVENOUS | Status: AC | PRN
  Administered 2016-11-23: 10 mL via INTRAVENOUS
  Filled 2016-11-23: qty 10

## 2016-11-23 MED ORDER — MIRTAZAPINE 30 MG PO TABS: 30.0000 mg | ORAL_TABLET | Freq: Every day | ORAL | 3 refills | Status: AC

## 2016-11-23 NOTE — Assessment & Plan Note (Addendum)
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 Currently on alimta s/p cycle #3. CT march 16th- mild progression noted.  # HOLD cycle #4 [see discussion below]- as patient feels poorly.. Labs today reviewed; labs okay- except for severe anemia.   # severe anemia- Hb 7.7; symptomatic- recommend 1 unit PRBC transfusion.   # Depression- started on Remeron; not taking; new script given.   # weight loss/ poor apetitie-improved;  Increase to 20mg /day Prednisone. New script given.   # Peripheral neuropathy- G-1. Monitor for now.   # follow up in 3 weeks/HOLD tube; lab/chemo; will discuss with Dr.Secord.   # I reviewed the blood work- with the patient in detail; also reviewed the imaging independently [as summarized above]; and with the patient in detail.

## 2016-11-23 NOTE — Progress Notes (Signed)
Nutrition Follow-up:  Nutrition follow-up completed in infusion this pm.  Patient with endocervical cancer with metastatic disease.  Patient reports for the past 3 weeks she has not been able to eat due to nausea, vomiting.  Reports nausea following drinking CT contrast as well.  Patient reports appetite is better but she is still not eating enough.  Reports for lunch ate 2 tacos and drank pepsi and for breakfast had 2 pieces of toast. Reports ate steak last night for dinner.  Not really drinking oral nutrition supplements, they make me nauseated.     Medications: predinsone, remeron, zofran, compazine, phenergan  Labs: Na 128, glucose 244, albumin 2.2, Hgb 7.4  Anthropometrics:   Patient weight has increased to 127 pounds 8 oz today from 125 pounds on 2/5   NUTRITION DIAGNOSIS: Inadequate food and beverage intake improving   MALNUTRITION DIAGNOSIS: Severe malnutrition improving   INTERVENTION:   Encouraged patient to take nausea medication. Encouraged patient to consume high calorie, high protein snacks and eat small frequent meals.       MONITORING, EVALUATION, GOAL: Patient will consume adequate calories to prevent further weight loss   NEXT VISIT: April 9th  Taylon Louison B. [name], [place], [place] Registered Dietitian [phone] (pager)

## 2016-11-23 NOTE — Progress Notes (Signed)
[place] OFFICE PROGRESS NOTE  Patient Care Team: Petra Kuba, MD as PCP - General (Family Medicine) Clent Jacks, RN as Registered Nurse  Primary cervical cancer with metastasis to other site [facility])   Staging form: Cervix Uteri, AJCC 7th Edition     Clinical: No stage assigned - Unsigned    Oncology History   # FEB 2017- STAGE IV HIGH GRADE SEROUS ENDOCERVICAL vs endometrial ADENO CA [s/p Inguinal LN Bx; s/p endocervical Bx- pos; p16 pos? endocervical] Lymphadenopathy-RP/Ingiunal LN; CT-PET bil pul nodules [sub-cm]/ MRI- pelvis; March 10th- Carbo-taxol x3 cycles- MAY CT- PROGRESSION  # MAY 2017- TAXOL- TOPOTECAN- AVASTIN; 03/16/2016-CT-Partial Response; SEP 7th 2017- Partial response.   # OCT 30th 2017- Avastin q 3 W [maintenance]; DEC 28t 2017-CT CAP- PROGRESSION  # JAN 2018-Alimat every 3 w  # L-1spinous process lytic lesion [on PET]- not on X-geva. S/p RT [JULy 2017]   # MOLECULAR TESTING: MSI- STABLE     Primary cervical cancer with metastasis to other site [facility])     INTERVAL HISTORY:  A very pleasant 72 year old female patient with above history of endocervical cancer currently on  Alimta every 3 weeks status post cycle 3 is here for follow-up.  Patient continues to complain of worsening fatigue. Denies any blood in stools or black stools. Overall feels poorly. However prednisone seems to help her appetite. She has gained about 6-7 pounds.  Denies any pain. Denies any skin rash. Denies any swelling in the legs. Complains of shortness of breath on exertion. She stopped taking Remeron as she is ran out of her medication.   REVIEW OF SYSTEMS:  A complete 10 point review of system is done which is negative except mentioned above/history of present illness.   PAST MEDICAL HISTORY :  Past Medical History:  Diagnosis Date  . Anxiety   . Carpal tunnel syndrome   . Chemotherapy-induced nausea and  vomiting   . Decrease in appetite   . Decreased appetite   . Depression   . Endometrial cancer ([place]) 11/06/2015  . Hypertension   . Lymphadenopathy 10/25/15  . Nausea & vomiting   . Port-a-cath in place   . Weight loss    30 lb weight loss since October 2016  . Weight loss     PAST SURGICAL HISTORY :   Past Surgical History:  Procedure Laterality Date  . CARPAL TUNNEL RELEASE Left   . FLEXIBLE SIGMOIDOSCOPY  05/2015   performed by Dr. Petra Kuba,  . PERIPHERAL VASCULAR CATHETERIZATION N/A 02/25/2016   Procedure: Glori Luis Cath Insertion;  Surgeon: Katha Cabal, MD;  Location: [place] [facility];  Service: Cardiovascular;  Laterality: N/A;  . SKIN CANCER EXCISION      FAMILY HISTORY :   Family History  Problem Relation Age of Onset  . Breast cancer Sister   . Hypertension Sister   . Hypertension Brother   . Ovarian cancer Paternal Aunt   . Diabetes Maternal Grandmother   . Breast cancer Daughter   . Breast cancer Cousin     maternal    SOCIAL HISTORY:   Social History  Substance Use Topics  . Smoking status: Never Smoker  . Smokeless tobacco: Never Used  . Alcohol use No    ALLERGIES:  is allergic to ibuprofen and tape.  MEDICATIONS:  Current Outpatient Prescriptions  Medication Sig Dispense Refill  . amLODipine (NORVASC) 10 MG tablet Take 1 tablet by mouth daily. Reported  on 11/25/2015    . aspirin EC 81 MG tablet Take 81 mg by mouth daily. Reported on 4/85/4627    . folic acid (FOLVITE) 1 MG tablet Take 1 tablet (1 mg total) by mouth daily. 30 tablet 6  . hydrochlorothiazide (HYDRODIURIL) 25 MG tablet Take 1 tablet by mouth daily. Reported on 01/31/2016    . lidocaine-prilocaine (EMLA) cream Apply 1 application topically as needed. To port site 30 g 0  . losartan (COZAAR) 100 MG tablet Take 1 tablet by mouth daily. Reported on 11/25/2015    . ondansetron (ZOFRAN) 4 MG/5ML solution 4-8 mg po every 8 hours as needed for nausea 50 mL 0  . predniSONE  (DELTASONE) 20 MG tablet Take 1 pill every day with break fast. 30 tablet 0  . prochlorperazine (COMPAZINE) 10 MG tablet Take 1 tablet (10 mg total) by mouth every 6 (six) hours as needed for nausea or vomiting. 40 tablet 0  . promethazine (PHENERGAN) 12.5 MG tablet Take 1 tablet (12.5 mg total) by mouth every 8 (eight) hours as needed for nausea or vomiting. 30 tablet 0  . mirtazapine (REMERON) 30 MG tablet Take 1 tablet (30 mg total) by mouth at bedtime. 30 tablet 3   No current facility-administered medications for this visit.    Facility-Administered Medications Ordered in Other Visits  Medication Dose Route Frequency Provider Last Rate Last Dose  . heparin lock flush 100 unit/mL  500 Units Intravenous Once Cammie Sickle, MD      . sodium chloride flush (NS) 0.9 % injection 10 mL  10 mL Intravenous PRN Cammie Sickle, MD   10 mL at 11/23/16 1355    PHYSICAL EXAMINATION: ECOG PERFORMANCE STATUS: 1 - Symptomatic but completely ambulatory  BP (!) 151/83 (BP Location: Left Arm, Patient Position: Sitting)   Pulse 97   Temp 97.4 F (36.3 C) (Tympanic)   Resp 18   Wt 127 lb 8 oz (57.8 kg)   BMI 19.97 kg/m   Filed Weights   11/23/16 1424  Weight: 127 lb 8 oz (57.8 kg)    GENERAL:Thin built moderately nourished female patient  Alert, no distress and comfortable.   Accompanied by husband.  EYES: no pallor or icterus OROPHARYNX: no thrush or ulceration; good dentition  NECK: supple, no masses felt LYMPH:  no palpable lymphadenopathy in the cervical, axillary region. 1 cm lymph node felt in the right inguinal region.  LUNGS: clear to auscultation and  No wheeze or crackles HEART/CVS: regular rate & rhythm and no murmurs; No lower extremity edema ABDOMEN:abdomen soft, non-tender and normal bowel sounds Musculoskeletal:no cyanosis of digits and no clubbing  PSYCH: alert & oriented x 3 with fluent speech NEURO: no focal motor/sensory deficits SKIN:  no rashes or significant  lesions  LABORATORY DATA:  I have reviewed the data as listed    Component Value Date/Time   NA 128 (L) 11/23/2016 1355   K 3.5 11/23/2016 1355   CL 97 (L) 11/23/2016 1355   CO2 24 11/23/2016 1355   GLUCOSE 244 (H) 11/23/2016 1355   BUN 9 11/23/2016 1355   CREATININE 0.63 11/23/2016 1355   CALCIUM 8.6 (L) 11/23/2016 1355   PROT 7.7 11/23/2016 1355   ALBUMIN 2.2 (L) 11/23/2016 1355   AST 51 (H) 11/23/2016 1355   ALT 35 11/23/2016 1355   ALKPHOS 71 11/23/2016 1355   BILITOT 0.3 11/23/2016 1355   GFRNONAA >60 11/23/2016 1355   GFRAA >60 11/23/2016 1355    No results found  for: SPEP, UPEP  Lab Results  Component Value Date   WBC 5.1 11/23/2016   NEUTROABS 4.6 11/23/2016   HGB 7.4 (L) 11/23/2016   HCT 22.9 (L) 11/23/2016   MCV 77.2 (L) 11/23/2016   PLT 344 11/23/2016      Chemistry      Component Value Date/Time   NA 128 (L) 11/23/2016 1355   K 3.5 11/23/2016 1355   CL 97 (L) 11/23/2016 1355   CO2 24 11/23/2016 1355   BUN 9 11/23/2016 1355   CREATININE 0.63 11/23/2016 1355      Component Value Date/Time   CALCIUM 8.6 (L) 11/23/2016 1355   ALKPHOS 71 11/23/2016 1355   AST 51 (H) 11/23/2016 1355   ALT 35 11/23/2016 1355   BILITOT 0.3 11/23/2016 1355     IMPRESSION: Mass-like thickening of the lower uterus and cervix, compatible with known endocervical cancer.  Suspected bilateral ovarian metastases, progressed on the left.  Retroperitoneal and bilateral pelvic nodal metastases, grossly unchanged.  Bilateral pulmonary metastases, including a 1.9 x 1.3 cm nodule at the left lung apex, mildly progressed.  Additional ancillary findings as above.   Electronically Signed   By: Julian Hy M.D.   On: 11/20/2016 12:44   RADIOGRAPHIC STUDIES: I have personally reviewed the radiological images as listed and agreed with the findings in the report. No results found.   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 Currently on alimta s/p cycle #3. CT march 16th- mild progression noted.  # HOLD cycle #4 [see discussion below]- as patient feels poorly.. Labs today reviewed; labs okay- except for severe anemia.   # severe anemia- Hb 7.7; symptomatic- recommend 1 unit PRBC transfusion.   # Depression- started on Remeron; not taking; new script given.   # weight loss/ poor apetitie-improved;  Increase to 24m/day Prednisone. New script given.   # Peripheral neuropathy- G-1. Monitor for now.   # follow up in 3 weeks/HOLD tube; lab/chemo; will discuss with Dr.Secord.   # I reviewed the blood work- with the patient in detail; also reviewed the imaging independently [as summarized above]; and with the patient in detail.     No orders of the defined types were placed in this encounter.     GCammie Sickle MD 11/23/2016 4:38 PM        GCammie Sickle MD 11/23/2016 4:38 PM

## 2016-11-23 NOTE — Progress Notes (Signed)
Patient here today for follow up.  Patient states no new concerns today  

## 2016-11-24 ENCOUNTER — Inpatient Hospital Stay: Payer: Medicare Other

## 2016-11-24 DIAGNOSIS — C539 Malignant neoplasm of cervix uteri, unspecified: Secondary | ICD-10-CM | POA: Diagnosis not present

## 2016-11-24 DIAGNOSIS — T451X5A Adverse effect of antineoplastic and immunosuppressive drugs, initial encounter: Secondary | ICD-10-CM

## 2016-11-24 DIAGNOSIS — D6481 Anemia due to antineoplastic chemotherapy: Secondary | ICD-10-CM

## 2016-11-24 MED ORDER — HEPARIN SOD (PORK) LOCK FLUSH 100 UNIT/ML IV SOLN
500.0000 [IU] | Freq: Every day | INTRAVENOUS | Status: AC | PRN
  Administered 2016-11-24: 500 [IU]
  Filled 2016-11-24: qty 5

## 2016-11-24 MED ORDER — ACETAMINOPHEN 325 MG PO TABS
650.0000 mg | ORAL_TABLET | Freq: Once | ORAL | Status: AC
  Administered 2016-11-24: 650 mg via ORAL
  Filled 2016-11-24: qty 2

## 2016-11-24 MED ORDER — DIPHENHYDRAMINE HCL 25 MG PO CAPS
25.0000 mg | ORAL_CAPSULE | Freq: Once | ORAL | Status: AC
  Administered 2016-11-24: 25 mg via ORAL
  Filled 2016-11-24: qty 1

## 2016-11-24 MED ORDER — SODIUM CHLORIDE 0.9 % IV SOLN
250.0000 mL | Freq: Once | INTRAVENOUS | Status: AC
  Administered 2016-11-24: 250 mL via INTRAVENOUS
  Filled 2016-11-24: qty 250

## 2016-11-24 MED ORDER — SODIUM CHLORIDE 0.9% FLUSH
10.0000 mL | INTRAVENOUS | Status: AC | PRN
  Administered 2016-11-24: 10 mL
  Filled 2016-11-24: qty 10

## 2016-11-25 LAB — BPAM RBC
Blood Product Expiration Date: 201803252359
Blood Product Expiration Date: 201804092359
ISSUE DATE / TIME: 201803201417
Unit Type and Rh: 600
Unit Type and Rh: 6200

## 2016-11-25 LAB — TYPE AND SCREEN
ABO/RH(D): A POS
Antibody Screen: NEGATIVE
UNIT DIVISION: 0
UNIT DIVISION: 0

## 2016-12-10 ENCOUNTER — Other Ambulatory Visit: Payer: Self-pay | Admitting: *Deleted

## 2016-12-10 ENCOUNTER — Encounter: Payer: Self-pay | Admitting: Internal Medicine

## 2016-12-10 DIAGNOSIS — R1310 Dysphagia, unspecified: Secondary | ICD-10-CM

## 2016-12-10 DIAGNOSIS — R110 Nausea: Secondary | ICD-10-CM

## 2016-12-10 DIAGNOSIS — C539 Malignant neoplasm of cervix uteri, unspecified: Secondary | ICD-10-CM

## 2016-12-10 DIAGNOSIS — E860 Dehydration: Secondary | ICD-10-CM

## 2016-12-11 ENCOUNTER — Inpatient Hospital Stay: Payer: Medicare Other

## 2016-12-11 ENCOUNTER — Other Ambulatory Visit: Payer: Self-pay | Admitting: Internal Medicine

## 2016-12-11 ENCOUNTER — Inpatient Hospital Stay: Payer: Medicare Other | Attending: Internal Medicine | Admitting: *Deleted

## 2016-12-11 VITALS — BP 139/81 | HR 100 | Temp 98.5°F | Resp 20

## 2016-12-11 DIAGNOSIS — R110 Nausea: Secondary | ICD-10-CM | POA: Diagnosis not present

## 2016-12-11 DIAGNOSIS — Z8041 Family history of malignant neoplasm of ovary: Secondary | ICD-10-CM | POA: Diagnosis not present

## 2016-12-11 DIAGNOSIS — R419 Unspecified symptoms and signs involving cognitive functions and awareness: Secondary | ICD-10-CM | POA: Insufficient documentation

## 2016-12-11 DIAGNOSIS — R5383 Other fatigue: Secondary | ICD-10-CM | POA: Diagnosis not present

## 2016-12-11 DIAGNOSIS — C775 Secondary and unspecified malignant neoplasm of intrapelvic lymph nodes: Secondary | ICD-10-CM | POA: Diagnosis not present

## 2016-12-11 DIAGNOSIS — Z7982 Long term (current) use of aspirin: Secondary | ICD-10-CM | POA: Insufficient documentation

## 2016-12-11 DIAGNOSIS — C7802 Secondary malignant neoplasm of left lung: Secondary | ICD-10-CM | POA: Insufficient documentation

## 2016-12-11 DIAGNOSIS — D649 Anemia, unspecified: Secondary | ICD-10-CM | POA: Insufficient documentation

## 2016-12-11 DIAGNOSIS — I10 Essential (primary) hypertension: Secondary | ICD-10-CM | POA: Insufficient documentation

## 2016-12-11 DIAGNOSIS — Z923 Personal history of irradiation: Secondary | ICD-10-CM | POA: Diagnosis not present

## 2016-12-11 DIAGNOSIS — R0602 Shortness of breath: Secondary | ICD-10-CM | POA: Insufficient documentation

## 2016-12-11 DIAGNOSIS — Z79899 Other long term (current) drug therapy: Secondary | ICD-10-CM | POA: Insufficient documentation

## 2016-12-11 DIAGNOSIS — R630 Anorexia: Secondary | ICD-10-CM | POA: Insufficient documentation

## 2016-12-11 DIAGNOSIS — C539 Malignant neoplasm of cervix uteri, unspecified: Secondary | ICD-10-CM | POA: Diagnosis present

## 2016-12-11 DIAGNOSIS — Z803 Family history of malignant neoplasm of breast: Secondary | ICD-10-CM | POA: Insufficient documentation

## 2016-12-11 DIAGNOSIS — R634 Abnormal weight loss: Secondary | ICD-10-CM | POA: Diagnosis not present

## 2016-12-11 DIAGNOSIS — E860 Dehydration: Secondary | ICD-10-CM

## 2016-12-11 DIAGNOSIS — C7801 Secondary malignant neoplasm of right lung: Secondary | ICD-10-CM | POA: Insufficient documentation

## 2016-12-11 DIAGNOSIS — F329 Major depressive disorder, single episode, unspecified: Secondary | ICD-10-CM | POA: Insufficient documentation

## 2016-12-11 LAB — CBC WITH DIFFERENTIAL/PLATELET
BASOS ABS: 0 10*3/uL (ref 0–0.1)
BASOS PCT: 0 %
Eosinophils Absolute: 0 10*3/uL (ref 0–0.7)
Eosinophils Relative: 1 %
HEMATOCRIT: 25.7 % — AB (ref 35.0–47.0)
Hemoglobin: 8.3 g/dL — ABNORMAL LOW (ref 12.0–16.0)
LYMPHS PCT: 6 %
Lymphs Abs: 0.4 10*3/uL — ABNORMAL LOW (ref 1.0–3.6)
MCH: 25.9 pg — ABNORMAL LOW (ref 26.0–34.0)
MCHC: 32.4 g/dL (ref 32.0–36.0)
MCV: 79.9 fL — AB (ref 80.0–100.0)
MONO ABS: 0.7 10*3/uL (ref 0.2–0.9)
Monocytes Relative: 9 %
NEUTROS ABS: 6.1 10*3/uL (ref 1.4–6.5)
Neutrophils Relative %: 84 %
PLATELETS: 404 10*3/uL (ref 150–440)
RBC: 3.21 MIL/uL — AB (ref 3.80–5.20)
RDW: 19.7 % — AB (ref 11.5–14.5)
WBC: 7.3 10*3/uL (ref 3.6–11.0)

## 2016-12-11 LAB — COMPREHENSIVE METABOLIC PANEL
ALBUMIN: 2.3 g/dL — AB (ref 3.5–5.0)
ALT: 19 U/L (ref 14–54)
AST: 24 U/L (ref 15–41)
Alkaline Phosphatase: 123 U/L (ref 38–126)
Anion gap: 10 (ref 5–15)
BILIRUBIN TOTAL: 0.9 mg/dL (ref 0.3–1.2)
BUN: 12 mg/dL (ref 6–20)
CHLORIDE: 95 mmol/L — AB (ref 101–111)
CO2: 21 mmol/L — ABNORMAL LOW (ref 22–32)
CREATININE: 0.64 mg/dL (ref 0.44–1.00)
Calcium: 8.9 mg/dL (ref 8.9–10.3)
GFR calc Af Amer: >60 mL/min (ref >60)
GLUCOSE: 104 mg/dL — AB (ref 65–99)
POTASSIUM: 4.3 mmol/L (ref 3.5–5.1)
Sodium: 126 mmol/L — ABNORMAL LOW (ref 135–145)
Total Protein: 8.4 g/dL — ABNORMAL HIGH (ref 6.5–8.1)

## 2016-12-11 LAB — SAMPLE TO BLOOD BANK

## 2016-12-11 LAB — MAGNESIUM: Magnesium: 1.9 mg/dL (ref 1.7–2.4)

## 2016-12-11 MED ORDER — SODIUM CHLORIDE 0.9 % IV SOLN
Freq: Once | INTRAVENOUS | Status: AC
  Administered 2016-12-11: 14:00:00 via INTRAVENOUS
  Filled 2016-12-11: qty 1000

## 2016-12-11 MED ORDER — HEPARIN SOD (PORK) LOCK FLUSH 100 UNIT/ML IV SOLN
500.0000 [IU] | Freq: Once | INTRAVENOUS | Status: AC | PRN
  Administered 2016-12-11: 500 [IU]
  Filled 2016-12-11: qty 5

## 2016-12-11 MED ORDER — SODIUM CHLORIDE 0.9 % IV SOLN
Freq: Once | INTRAVENOUS | Status: AC
  Administered 2016-12-11: 14:00:00 via INTRAVENOUS
  Filled 2016-12-11: qty 4

## 2016-12-11 MED ORDER — SODIUM CHLORIDE 0.9% FLUSH
10.0000 mL | INTRAVENOUS | Status: DC | PRN
  Administered 2016-12-11: 10 mL
  Filled 2016-12-11: qty 10

## 2016-12-11 NOTE — Progress Notes (Signed)
Reviewed today's lab work via telephone with MD, Dr. Rogue Bussing. Per MD order: proceed with supportive therapy plan for IVF and Zofran only today.

## 2016-12-14 ENCOUNTER — Inpatient Hospital Stay: Payer: Medicare Other

## 2016-12-14 ENCOUNTER — Inpatient Hospital Stay (HOSPITAL_BASED_OUTPATIENT_CLINIC_OR_DEPARTMENT_OTHER): Payer: Medicare Other | Admitting: Internal Medicine

## 2016-12-14 ENCOUNTER — Other Ambulatory Visit: Payer: Self-pay | Admitting: *Deleted

## 2016-12-14 VITALS — BP 146/96 | HR 120 | Temp 98.5°F | Wt 121.1 lb

## 2016-12-14 DIAGNOSIS — C775 Secondary and unspecified malignant neoplasm of intrapelvic lymph nodes: Secondary | ICD-10-CM

## 2016-12-14 DIAGNOSIS — F329 Major depressive disorder, single episode, unspecified: Secondary | ICD-10-CM | POA: Diagnosis not present

## 2016-12-14 DIAGNOSIS — C7801 Secondary malignant neoplasm of right lung: Secondary | ICD-10-CM | POA: Diagnosis not present

## 2016-12-14 DIAGNOSIS — I10 Essential (primary) hypertension: Secondary | ICD-10-CM

## 2016-12-14 DIAGNOSIS — Z923 Personal history of irradiation: Secondary | ICD-10-CM

## 2016-12-14 DIAGNOSIS — Z7982 Long term (current) use of aspirin: Secondary | ICD-10-CM

## 2016-12-14 DIAGNOSIS — R5383 Other fatigue: Secondary | ICD-10-CM | POA: Diagnosis not present

## 2016-12-14 DIAGNOSIS — F419 Anxiety disorder, unspecified: Secondary | ICD-10-CM

## 2016-12-14 DIAGNOSIS — R634 Abnormal weight loss: Secondary | ICD-10-CM

## 2016-12-14 DIAGNOSIS — R110 Nausea: Secondary | ICD-10-CM

## 2016-12-14 DIAGNOSIS — C7802 Secondary malignant neoplasm of left lung: Secondary | ICD-10-CM | POA: Diagnosis not present

## 2016-12-14 DIAGNOSIS — R0602 Shortness of breath: Secondary | ICD-10-CM

## 2016-12-14 DIAGNOSIS — D649 Anemia, unspecified: Secondary | ICD-10-CM

## 2016-12-14 DIAGNOSIS — Z79899 Other long term (current) drug therapy: Secondary | ICD-10-CM | POA: Diagnosis not present

## 2016-12-14 DIAGNOSIS — C539 Malignant neoplasm of cervix uteri, unspecified: Secondary | ICD-10-CM

## 2016-12-14 DIAGNOSIS — R630 Anorexia: Secondary | ICD-10-CM

## 2016-12-14 DIAGNOSIS — Z8041 Family history of malignant neoplasm of ovary: Secondary | ICD-10-CM

## 2016-12-14 DIAGNOSIS — Z803 Family history of malignant neoplasm of breast: Secondary | ICD-10-CM

## 2016-12-14 DIAGNOSIS — E860 Dehydration: Secondary | ICD-10-CM

## 2016-12-14 LAB — COMPREHENSIVE METABOLIC PANEL
ALBUMIN: 2.2 g/dL — AB (ref 3.5–5.0)
ALT: 15 U/L (ref 14–54)
ANION GAP: 8 (ref 5–15)
AST: 25 U/L (ref 15–41)
Alkaline Phosphatase: 108 U/L (ref 38–126)
BILIRUBIN TOTAL: 0.7 mg/dL (ref 0.3–1.2)
BUN: 7 mg/dL (ref 6–20)
CO2: 23 mmol/L (ref 22–32)
Calcium: 8.5 mg/dL — ABNORMAL LOW (ref 8.9–10.3)
Chloride: 94 mmol/L — ABNORMAL LOW (ref 101–111)
Creatinine, Ser: 0.63 mg/dL (ref 0.44–1.00)
GFR calc Af Amer: >60 mL/min (ref >60)
GLUCOSE: 147 mg/dL — AB (ref 65–99)
Potassium: 3.7 mmol/L (ref 3.5–5.1)
Sodium: 125 mmol/L — ABNORMAL LOW (ref 135–145)
TOTAL PROTEIN: 7.7 g/dL (ref 6.5–8.1)

## 2016-12-14 LAB — CBC WITH DIFFERENTIAL/PLATELET
BASOS PCT: 0 %
Basophils Absolute: 0 10*3/uL (ref 0–0.1)
EOS PCT: 1 %
Eosinophils Absolute: 0.1 10*3/uL (ref 0–0.7)
HEMATOCRIT: 23.7 % — AB (ref 35.0–47.0)
Hemoglobin: 7.8 g/dL — ABNORMAL LOW (ref 12.0–16.0)
Lymphocytes Relative: 7 %
Lymphs Abs: 0.3 10*3/uL — ABNORMAL LOW (ref 1.0–3.6)
MCH: 25.9 pg — ABNORMAL LOW (ref 26.0–34.0)
MCHC: 32.8 g/dL (ref 32.0–36.0)
MCV: 78.9 fL — ABNORMAL LOW (ref 80.0–100.0)
MONO ABS: 0.4 10*3/uL (ref 0.2–0.9)
Monocytes Relative: 10 %
NEUTROS ABS: 3.7 10*3/uL (ref 1.4–6.5)
NEUTROS PCT: 82 %
PLATELETS: 323 10*3/uL (ref 150–440)
RBC: 3.01 MIL/uL — ABNORMAL LOW (ref 3.80–5.20)
RDW: 18.8 % — AB (ref 11.5–14.5)
WBC: 4.5 10*3/uL (ref 3.6–11.0)

## 2016-12-14 LAB — SAMPLE TO BLOOD BANK

## 2016-12-14 LAB — PREPARE RBC (CROSSMATCH)

## 2016-12-14 MED ORDER — HEPARIN SOD (PORK) LOCK FLUSH 100 UNIT/ML IV SOLN
500.0000 [IU] | Freq: Once | INTRAVENOUS | Status: AC | PRN
  Administered 2016-12-14: 500 [IU]
  Filled 2016-12-14: qty 5

## 2016-12-14 MED ORDER — ONDANSETRON 8 MG PO TBDP: 8.0000 mg | ORAL_TABLET | Freq: Three times a day (TID) | ORAL | 0 refills | Status: AC | PRN

## 2016-12-14 MED ORDER — SODIUM CHLORIDE 0.9 % IV SOLN
Freq: Once | INTRAVENOUS | Status: AC
  Administered 2016-12-14: 12:00:00 via INTRAVENOUS
  Filled 2016-12-14: qty 4

## 2016-12-14 MED ORDER — SODIUM CHLORIDE 0.9 % IV SOLN
Freq: Once | INTRAVENOUS | Status: AC
  Administered 2016-12-14: 12:00:00 via INTRAVENOUS
  Filled 2016-12-14: qty 1000

## 2016-12-14 MED ORDER — LORAZEPAM 0.5 MG PO TABS: 0.5000 mg | ORAL_TABLET | Freq: Three times a day (TID) | ORAL | 0 refills | Status: AC

## 2016-12-14 MED ORDER — PREDNISONE 20 MG PO TABS: ORAL_TABLET | ORAL | 0 refills | Status: AC

## 2016-12-14 NOTE — Progress Notes (Signed)
Patient here today for follow up.  Patient c/o "shooting" pains throughout body that happens periodically.  Patient  c/o nausea, dry heaves.  Pt has not taken any medications for the past 10 days.

## 2016-12-14 NOTE — Assessment & Plan Note (Addendum)
Stage IV metastatic endocervical cancer currently on alimta s/p cycle #3. CT march 16th- mild progression noted- especially in the lung and also the pelvic primary disease.  # Discontinue Alimta chemotherapy secondary to progression/poor tolerance. Discussed with Dr. Fransisca Connors; will discuss re: clinical trials at [facility] [? immunotherapy]; also send tumor for [facility].   # Severe nausea/poor appetite/weight loss- await Gastrografin esophagram as ordered; also recommend GI evaluation/endoscopy. Referral made.  Today IV fluids; Zofran. Also prescription given for this possible Zofran. Also recommend Ativan for nausea. Again recommend taking prednisone. Reviewed with the patient and family the imaging again- and also discussed my concerns that his symptoms are disproportionate to the cancer burden as seen on the CT scan.  # severe anemia- Hb 7.8; symptomatic- recommend 2 unit PRBC transfusion.   # Depression- started on Remeron; not taking; encouraged to take medications.    # Also discussed regarding palliative care; family interested. Referral made.   # follow up in 10 days/HOLD tube; lab/chemo; will discuss with Dr.Secord.

## 2016-12-14 NOTE — Progress Notes (Signed)
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 endocervical] Lymphadenopathy-RP/Ingiunal LN; CT-PET bil pul nodules [sub-cm]/ MRI- pelvis; March 10th- Carbo-taxol x3 cycles- MAY CT- PROGRESSION  # MAY 2017- TAXOL- TOPOTECAN- AVASTIN; 03/16/2016-CT-Partial Response; SEP 7th 2017- Partial response.   # OCT 30th 2017- Avastin q 3 W [maintenance]; DEC 28t 2017-CT CAP- PROGRESSION  # JAN 2018-Alimat every 3 w x3- march 2018-Progressive lung nodules  # L-1spinous process lytic lesion [on PET]- not on X-geva. S/p RT [JULy 2017]   # MOLECULAR TESTING: MSI- STABLE     Primary cervical cancer with metastasis to other site [facility])     INTERVAL HISTORY:  A very pleasant 71 year old female patient with above history of endocervical cancer currently on  Alimta every 3 weeks status post cycle 3; March 2018 CT scan showed progression of lung nodules. Last cycle was approximately 6 weeks ago.  Patient continues to feel poorly. She complains of significant fatigue. She complains of poor appetite. Poor taste. Chronic nausea. Denies any difficulty swallowing. He lost weight. She has not been taking her pills.   She denies any pain. Denies any pain. Denies any skin rash. Denies any swelling in the legs. Complains of shortness of breath on exertion. Denies any cough.   REVIEW OF SYSTEMS:  A complete 10 point review of system is done which is negative except mentioned above/history of present illness.   PAST MEDICAL HISTORY :  Past  Medical History:  Diagnosis Date  . Anxiety   . Carpal tunnel syndrome   . Chemotherapy-induced nausea and vomiting   . Decrease in appetite   . Decreased appetite   . Depression   . Endometrial cancer ([place]) 11/06/2015  . Hypertension   . Lymphadenopathy 10/25/15  . Nausea & vomiting   . Port-a-cath in place   . Weight loss    30 lb weight loss since October 2016  . Weight loss     PAST SURGICAL HISTORY :   Past Surgical History:  Procedure Laterality Date  . CARPAL TUNNEL RELEASE Left   . FLEXIBLE SIGMOIDOSCOPY  05/2015   performed by Dr. Petra Kuba,  . PERIPHERAL VASCULAR CATHETERIZATION N/A 02/25/2016   Procedure: Glori Luis Cath Insertion;  Surgeon: Katha Cabal, MD;  Location: [place] [facility];  Service: Cardiovascular;  Laterality: N/A;  . SKIN CANCER EXCISION      FAMILY HISTORY :   Family History  Problem Relation Age of Onset  . Breast cancer Sister   . Hypertension Sister   . Hypertension Brother   . Ovarian cancer Paternal Aunt   . Diabetes Maternal Grandmother   . Breast cancer Daughter   . Breast cancer Cousin     maternal    SOCIAL HISTORY:   Social History  Substance Use Topics  . Smoking status: Never Smoker  . Smokeless tobacco: Never Used  . Alcohol use No    ALLERGIES:  is allergic to ibuprofen and tape.  MEDICATIONS:  Current Outpatient Prescriptions  Medication Sig Dispense  Refill  . amLODipine (NORVASC) 10 MG tablet Take 1 tablet by mouth daily. Reported on 11/25/2015    . aspirin EC 81 MG tablet Take 81 mg by mouth daily. Reported on 3/35/4562    . folic acid (FOLVITE) 1 MG tablet Take 1 tablet (1 mg total) by mouth daily. (Patient not taking: Reported on 12/14/2016) 30 tablet 6  . hydrochlorothiazide (HYDRODIURIL) 25 MG tablet Take 1 tablet by mouth daily. Reported on 01/31/2016    . lidocaine-prilocaine (EMLA) cream Apply 1 application topically as needed. To port site (Patient not taking: Reported on 12/14/2016) 30 g 0  .  LORazepam (ATIVAN) 0.5 MG tablet Take 1 tablet (0.5 mg total) by mouth every 8 (eight) hours. As needed for nausea 50 tablet 0  . losartan (COZAAR) 100 MG tablet Take 1 tablet by mouth daily. Reported on 11/25/2015    . mirtazapine (REMERON) 30 MG tablet Take 1 tablet (30 mg total) by mouth at bedtime. (Patient not taking: Reported on 12/14/2016) 30 tablet 3  . ondansetron (ZOFRAN ODT) 8 MG disintegrating tablet Take 1 tablet (8 mg total) by mouth every 8 (eight) hours as needed for nausea or vomiting. 30 tablet 0  . ondansetron (ZOFRAN) 4 MG/5ML solution 4-8 mg po every 8 hours as needed for nausea (Patient not taking: Reported on 12/14/2016) 50 mL 0  . predniSONE (DELTASONE) 20 MG tablet Take 1 pill every day with break fast. 30 tablet 0  . prochlorperazine (COMPAZINE) 10 MG tablet Take 1 tablet (10 mg total) by mouth every 6 (six) hours as needed for nausea or vomiting. (Patient not taking: Reported on 12/14/2016) 40 tablet 0  . promethazine (PHENERGAN) 12.5 MG tablet Take 1 tablet (12.5 mg total) by mouth every 8 (eight) hours as needed for nausea or vomiting. (Patient not taking: Reported on 12/14/2016) 30 tablet 0   No current facility-administered medications for this visit.    Facility-Administered Medications Ordered in Other Visits  Medication Dose Route Frequency Provider Last Rate Last Dose  . heparin lock flush 100 unit/mL  500 Units Intravenous Once Cammie Sickle, MD      . sodium chloride flush (NS) 0.9 % injection 10 mL  10 mL Intravenous PRN Cammie Sickle, MD   10 mL at 11/23/16 1355    PHYSICAL EXAMINATION: ECOG PERFORMANCE STATUS: 1 - Symptomatic but completely ambulatory  BP (!) 146/96 (BP Location: Right Arm, Patient Position: Sitting)   Pulse (!) 120   Temp 98.5 F (36.9 C) (Tympanic)   Wt 121 lb 2 oz (54.9 kg)   BMI 18.97 kg/m   Filed Weights   12/14/16 1101  Weight: 121 lb 2 oz (54.9 kg)    GENERAL:Thin built moderately nourished female patient  Alert, no  distress and comfortable.   Accompanied by husband/Daughter family members. She is a wheelchair. EYES: no pallor or icterus OROPHARYNX: no thrush or ulceration; good dentition  NECK: supple, no masses felt LYMPH:  no palpable lymphadenopathy in the cervical, axillary region. 1 cm lymph node felt in the right inguinal region.  LUNGS: clear to auscultation and  No wheeze or crackles HEART/CVS: regular rate & rhythm and no murmurs; No lower extremity edema ABDOMEN:abdomen soft, non-tender and normal bowel sounds Musculoskeletal:no cyanosis of digits and no clubbing  PSYCH: alert & oriented x 3 with fluent speech NEURO: no focal motor/sensory deficits SKIN:  no rashes or significant lesions  LABORATORY DATA:  I have reviewed the data as listed    Component Value  Date/Time   NA 125 (L) 12/14/2016 1032   K 3.7 12/14/2016 1032   CL 94 (L) 12/14/2016 1032   CO2 23 12/14/2016 1032   GLUCOSE 147 (H) 12/14/2016 1032   BUN 7 12/14/2016 1032   CREATININE 0.63 12/14/2016 1032   CALCIUM 8.5 (L) 12/14/2016 1032   PROT 7.7 12/14/2016 1032   ALBUMIN 2.2 (L) 12/14/2016 1032   AST 25 12/14/2016 1032   ALT 15 12/14/2016 1032   ALKPHOS 108 12/14/2016 1032   BILITOT 0.7 12/14/2016 1032   GFRNONAA >60 12/14/2016 1032   GFRAA >60 12/14/2016 1032    No results found for: SPEP, UPEP  Lab Results  Component Value Date   WBC 4.5 12/14/2016   NEUTROABS 3.7 12/14/2016   HGB 7.8 (L) 12/14/2016   HCT 23.7 (L) 12/14/2016   MCV 78.9 (L) 12/14/2016   PLT 323 12/14/2016      Chemistry      Component Value Date/Time   NA 125 (L) 12/14/2016 1032   K 3.7 12/14/2016 1032   CL 94 (L) 12/14/2016 1032   CO2 23 12/14/2016 1032   BUN 7 12/14/2016 1032   CREATININE 0.63 12/14/2016 1032      Component Value Date/Time   CALCIUM 8.5 (L) 12/14/2016 1032   ALKPHOS 108 12/14/2016 1032   AST 25 12/14/2016 1032   ALT 15 12/14/2016 1032   BILITOT 0.7 12/14/2016 1032     IMPRESSION: Mass-like thickening of  the lower uterus and cervix, compatible with known endocervical cancer.  Suspected bilateral ovarian metastases, progressed on the left.  Retroperitoneal and bilateral pelvic nodal metastases, grossly unchanged.  Bilateral pulmonary metastases, including a 1.9 x 1.3 cm nodule at the left lung apex, mildly progressed.  Additional ancillary findings as above.   Electronically Signed   By: Julian Hy M.D.   On: 11/20/2016 12:44   RADIOGRAPHIC STUDIES: I have personally reviewed the radiological images as listed and agreed with the findings in the report. No results found.   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 CT march 16th- mild progression noted- especially in the lung and also the pelvic primary disease.  # Discontinue Alimta chemotherapy secondary to progression/poor tolerance. Discussed with Dr. Fransisca Connors; will discuss re: clinical trials at [facility] [? immunotherapy]; also send tumor for [facility].   # Severe nausea/poor appetite/weight loss- await Gastrografin esophagram as ordered; also recommend GI evaluation/endoscopy. Referral made.  Today IV fluids; Zofran. Also prescription given for this possible Zofran. Also recommend Ativan for nausea. Again recommend taking prednisone. Reviewed with the patient and family the imaging again- and also discussed my concerns that his symptoms are disproportionate to the cancer burden as seen on the CT scan.  # severe anemia- Hb 7.8; symptomatic- recommend 2 unit PRBC transfusion.   # Depression- started on Remeron; not taking; encouraged to take medications.    # Also discussed regarding palliative care; family interested. Referral made.   # follow up in 10 days/HOLD tube; lab/chemo; will discuss with Dr.Secord.    Orders Placed This Encounter  Procedures  . CBC with Differential    Standing Status:    Future    Standing Expiration Date:   12/14/2017  . Basic metabolic panel    Standing Status:   Future    Standing Expiration Date:   12/14/2017  . Ambulatory referral to Gastroenterology    Referral Priority:  Routine    Referral Type:   Consultation    Referral Reason:   Specialty Services Required    Referred to Provider:   Jonathon Bellows, MD    Number of Visits Requested:   1  . Hold Tube- Blood Bank    Standing Status:   Future    Standing Expiration Date:   12/14/2017      Cammie Sickle, MD 12/15/2016 8:35 AM        Cammie Sickle, MD 12/15/2016 8:35 AM

## 2016-12-14 NOTE — Progress Notes (Signed)
Nutrition Follow-up:  Attempted to see patient in infusion this am but patient complains of nausea, not feeling well this am.  Patient receiving fluids and zofran today.  "I just don't want to eat.  It is the cancer there is nothing more they can do." Patient almost in tears during short visit today.  Husband at chairside.   Medications: not taking remeron, increased predisone.  Noted patient has not taken any medication for the last 10 days.  Labs: Na 125, glucose 147  Anthropometrics:   Patient weight today 121 pounds decreased from 127 pounds 8 oz on 3/19.     NUTRITION DIAGNOSIS: Inadequate food and beverage intake improving   MALNUTRITION DIAGNOSIS: Severe malnutrition improving   INTERVENTION:   Offered support and encouragement today.   Concerned regarding Na level trending down.  Discussed with MD.     MONITORING, EVALUATION, GOAL: Patient will consume adequate calories and prevent further weight loss   NEXT VISIT:  April 19th during infusion  Jennifer Berry, [place], [place] Registered Dietitian [phone] (pager)

## 2016-12-15 ENCOUNTER — Inpatient Hospital Stay: Payer: Medicare Other

## 2016-12-15 DIAGNOSIS — D649 Anemia, unspecified: Secondary | ICD-10-CM

## 2016-12-15 DIAGNOSIS — C539 Malignant neoplasm of cervix uteri, unspecified: Secondary | ICD-10-CM | POA: Diagnosis not present

## 2016-12-15 MED ORDER — ACETAMINOPHEN 325 MG PO TABS
650.0000 mg | ORAL_TABLET | Freq: Once | ORAL | Status: AC
  Administered 2016-12-15: 650 mg via ORAL
  Filled 2016-12-15: qty 2

## 2016-12-15 MED ORDER — SODIUM CHLORIDE 0.9% FLUSH
10.0000 mL | INTRAVENOUS | Status: AC | PRN
  Administered 2016-12-15: 10 mL
  Filled 2016-12-15: qty 10

## 2016-12-15 MED ORDER — DIPHENHYDRAMINE HCL 25 MG PO CAPS
25.0000 mg | ORAL_CAPSULE | Freq: Once | ORAL | Status: AC
  Administered 2016-12-15: 25 mg via ORAL
  Filled 2016-12-15: qty 1

## 2016-12-15 MED ORDER — HEPARIN SOD (PORK) LOCK FLUSH 100 UNIT/ML IV SOLN
500.0000 [IU] | Freq: Once | INTRAVENOUS | Status: AC
  Administered 2016-12-15: 500 [IU] via INTRAVENOUS
  Filled 2016-12-15: qty 5

## 2016-12-15 MED ORDER — SODIUM CHLORIDE 0.9 % IV SOLN
INTRAVENOUS | Status: DC
  Administered 2016-12-15: 09:00:00 via INTRAVENOUS
  Filled 2016-12-15: qty 1000

## 2016-12-15 NOTE — Progress Notes (Signed)
Spoke with blood bank, [facility], and RN, Renita Papa, via telephone. Per blood bank and RN: patient only qualifies for 1 unit of pRBCs transfusion today. MD, Dr. Rogue Bussing, notified yesterday and already aware. Patient is only to receive 1 unit pRBCs today.

## 2016-12-16 LAB — TYPE AND SCREEN
ABO/RH(D): A POS
ANTIBODY SCREEN: NEGATIVE
Unit division: 0

## 2016-12-16 LAB — BPAM RBC
Blood Product Expiration Date: 201804152359
ISSUE DATE / TIME: 201804100902
Unit Type and Rh: 5100

## 2016-12-21 ENCOUNTER — Encounter: Payer: Self-pay | Admitting: Internal Medicine

## 2016-12-21 ENCOUNTER — Telehealth: Payer: Self-pay | Admitting: Internal Medicine

## 2016-12-21 NOTE — Telephone Encounter (Signed)
Hospice referral faxed.

## 2016-12-21 NOTE — Telephone Encounter (Signed)
Decline in clinical status - Talked to pt's daughter- lisa; recommend hospice referral. Earlier discussed with Dr.Secord; no clinical trial options available. Recommend hospice asap.

## 2016-12-23 ENCOUNTER — Telehealth: Payer: Self-pay | Admitting: *Deleted

## 2016-12-23 DIAGNOSIS — R110 Nausea: Secondary | ICD-10-CM

## 2016-12-23 DIAGNOSIS — F411 Generalized anxiety disorder: Secondary | ICD-10-CM

## 2016-12-23 DIAGNOSIS — K219 Gastro-esophageal reflux disease without esophagitis: Secondary | ICD-10-CM

## 2016-12-23 DIAGNOSIS — C539 Malignant neoplasm of cervix uteri, unspecified: Secondary | ICD-10-CM

## 2016-12-23 MED ORDER — LORAZEPAM 0.5 MG PO TABS: 0.5000 mg | ORAL_TABLET | Freq: Three times a day (TID) | ORAL | 3 refills | Status: AC | PRN

## 2016-12-23 MED ORDER — ONDANSETRON 8 MG PO TBDP: 8.0000 mg | ORAL_TABLET | ORAL | 3 refills | Status: AC | PRN

## 2016-12-23 MED ORDER — OMEPRAZOLE 20 MG PO CPDR: 20.0000 mg | DELAYED_RELEASE_CAPSULE | Freq: Two times a day (BID) | ORAL | 3 refills | Status: AC

## 2016-12-23 NOTE — Telephone Encounter (Signed)
Patient complains of constant n/v, esophageal and stomach burning. She takes Zofran ODT 8 mg q 8 h which she can take because it dissolves on her tongue. She has phenergan and compazine available, but reports the pills come up before they get down. She also reports that her esophagus and stomach are burning. She is depressed and states she is just waiting for the end to come and hopes it is fast. Hospice requesting Haldol, Prilosec and anything else you feel is appropriate. Please advise

## 2016-12-23 NOTE — Addendum Note (Signed)
Addended by: Sabino Gasser on: 12/23/2016 09:37 AM   Modules accepted: Orders

## 2016-12-23 NOTE — Telephone Encounter (Signed)
Spoke with Dr. Rogue Bussing  1. MD advises against Haldol.  2. MD sent rx for prilosec 20 mg twice daily to pt's pharmacy. 3. MD prescribed Zofran ODT 8 mg every 4 to 6 hrs prn N/V 4. Md recommends pt continue taking Ativan 0.5 mg 1 tablet every 8 hrs for anxiety-induced N&V  Hassan Rowan- please let Hospice RN know md recommendations.

## 2016-12-23 NOTE — Telephone Encounter (Signed)
Message left for Jennifer Berry on her VM

## 2016-12-24 ENCOUNTER — Inpatient Hospital Stay: Payer: Medicare Other

## 2017-01-05 ENCOUNTER — Encounter: Payer: Self-pay | Admitting: Internal Medicine

## 2017-01-12 ENCOUNTER — Encounter: Payer: Self-pay | Admitting: Internal Medicine

## 2017-01-13 ENCOUNTER — Encounter: Payer: Self-pay | Admitting: Internal Medicine

## 2017-01-28 ENCOUNTER — Telehealth: Payer: Self-pay | Admitting: *Deleted

## 2017-01-28 NOTE — Telephone Encounter (Signed)
Erroneous entry

## 2017-02-02 ENCOUNTER — Telehealth: Payer: Self-pay | Admitting: *Deleted

## 2017-02-02 NOTE — Telephone Encounter (Signed)
Informed Vivien Rota of D rB order to increase to 10 mg Zyprexa

## 2017-02-02 NOTE — Telephone Encounter (Signed)
States that patient is taking Zyprexa for N/V which has now returned and is asking for an increase. I returned call to Vivien Rota to ask who started her on this med and what the current dose is that she is taking. Awaiting a return call.

## 2017-02-02 NOTE — Telephone Encounter (Signed)
Per Dr Rogue Bussing okay to increase to 10mg  at bedtime, x 30 days.

## 2017-02-02 NOTE — Telephone Encounter (Signed)
Jennifer Berry is requesting an increase in dose to more than 5 mg

## 2017-02-02 NOTE — Telephone Encounter (Signed)
Per Vivien Rota, she is on Zyprexa 5 mg at HS per protocol. Please advise

## 2017-02-02 NOTE — Telephone Encounter (Signed)
Per Dr. Rogue Bussing, please have patient Continue with taking Zyprexa 5 mg at bedtime as per protocol

## 2017-03-07 DEATH — deceased

## 2017-03-12 ENCOUNTER — Telehealth: Payer: Self-pay | Admitting: *Deleted

## 2017-03-13 ENCOUNTER — Other Ambulatory Visit: Payer: Self-pay | Admitting: Nurse Practitioner

## 2017-04-07 NOTE — Telephone Encounter (Signed)
[facility] called to report hat Ms Jennifer Berry passed this morning 03-27-2017 at 6:40 AM

## 2017-04-07 DEATH — deceased

## 2017-05-04 NOTE — Progress Notes (Signed)
Erroneous entry

## 2017-06-03 ENCOUNTER — Telehealth: Payer: Self-pay | Admitting: *Deleted

## 2017-06-03 NOTE — Telephone Encounter (Signed)
Erroneous entry
